# Patient Record
Sex: Male | Born: 1960
Health system: Southern US, Community
[De-identification: ages and names within clinical notes are randomized; demographics above are authoritative.]

## PROBLEM LIST (undated history)

## (undated) DIAGNOSIS — K279 Peptic ulcer, site unspecified, unspecified as acute or chronic, without hemorrhage or perforation: Secondary | ICD-10-CM

## (undated) DIAGNOSIS — K922 Gastrointestinal hemorrhage, unspecified: Secondary | ICD-10-CM

## (undated) DIAGNOSIS — I8500 Esophageal varices without bleeding: Secondary | ICD-10-CM

## (undated) DIAGNOSIS — M1990 Unspecified osteoarthritis, unspecified site: Secondary | ICD-10-CM

## (undated) DIAGNOSIS — K7460 Unspecified cirrhosis of liver: Secondary | ICD-10-CM

## (undated) DIAGNOSIS — F1910 Other psychoactive substance abuse, uncomplicated: Secondary | ICD-10-CM

## (undated) DIAGNOSIS — D649 Anemia, unspecified: Secondary | ICD-10-CM

## (undated) DIAGNOSIS — Z5189 Encounter for other specified aftercare: Secondary | ICD-10-CM

## (undated) DIAGNOSIS — F329 Major depressive disorder, single episode, unspecified: Secondary | ICD-10-CM

## (undated) DIAGNOSIS — C61 Malignant neoplasm of prostate: Secondary | ICD-10-CM

## (undated) DIAGNOSIS — E785 Hyperlipidemia, unspecified: Secondary | ICD-10-CM

## (undated) DIAGNOSIS — IMO0001 Reserved for inherently not codable concepts without codable children: Secondary | ICD-10-CM

## (undated) DIAGNOSIS — R748 Abnormal levels of other serum enzymes: Secondary | ICD-10-CM

## (undated) DIAGNOSIS — F32A Depression, unspecified: Secondary | ICD-10-CM

## (undated) DIAGNOSIS — K219 Gastro-esophageal reflux disease without esophagitis: Secondary | ICD-10-CM

## (undated) DIAGNOSIS — B9681 Helicobacter pylori [H. pylori] as the cause of diseases classified elsewhere: Secondary | ICD-10-CM

## (undated) DIAGNOSIS — Z550 Illiteracy and low-level literacy: Secondary | ICD-10-CM

## (undated) DIAGNOSIS — F1020 Alcohol dependence, uncomplicated: Secondary | ICD-10-CM

## (undated) DIAGNOSIS — I10 Essential (primary) hypertension: Secondary | ICD-10-CM

## (undated) DIAGNOSIS — Z87828 Personal history of other (healed) physical injury and trauma: Secondary | ICD-10-CM

## (undated) HISTORY — PX: APPENDECTOMY: SHX54

## (undated) HISTORY — PX: COLONOSCOPY: SHX174

## (undated) HISTORY — PX: KNEE ARTHROSCOPY: SHX127

## (undated) HISTORY — PX: TONSILLECTOMY: SUR1361

## (undated) HISTORY — DX: Unspecified cirrhosis of liver: K74.60

## (undated) HISTORY — DX: Hyperlipidemia, unspecified: E78.5

## (undated) HISTORY — DX: Unspecified osteoarthritis, unspecified site: M19.90

## (undated) HISTORY — PX: SHOULDER ARTHROSCOPY: SHX128

## (undated) HISTORY — DX: Other psychoactive substance abuse, uncomplicated: F19.10

---
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
## 1999-02-06 ENCOUNTER — Emergency Department (HOSPITAL_COMMUNITY): Admission: EM | Admit: 1999-02-06 | Discharge: 1999-02-06 | Payer: Self-pay | Admitting: Emergency Medicine

## 1999-02-06 ENCOUNTER — Encounter: Payer: Self-pay | Admitting: Emergency Medicine

## 1999-02-06 ENCOUNTER — Encounter: Payer: Self-pay | Admitting: *Deleted

## 1999-02-10 ENCOUNTER — Emergency Department (HOSPITAL_COMMUNITY): Admission: EM | Admit: 1999-02-10 | Discharge: 1999-02-10 | Payer: Self-pay | Admitting: Emergency Medicine

## 2000-02-21 ENCOUNTER — Emergency Department (HOSPITAL_COMMUNITY): Admission: EM | Admit: 2000-02-21 | Discharge: 2000-02-21 | Payer: Self-pay | Admitting: Emergency Medicine

## 2000-06-01 ENCOUNTER — Emergency Department (HOSPITAL_COMMUNITY): Admission: EM | Admit: 2000-06-01 | Discharge: 2000-06-01 | Payer: Self-pay | Admitting: Emergency Medicine

## 2000-10-31 DIAGNOSIS — W3400XA Accidental discharge from unspecified firearms or gun, initial encounter: Secondary | ICD-10-CM

## 2000-10-31 DIAGNOSIS — Z87828 Personal history of other (healed) physical injury and trauma: Secondary | ICD-10-CM

## 2000-10-31 HISTORY — DX: Personal history of other (healed) physical injury and trauma: Z87.828

## 2001-08-12 ENCOUNTER — Encounter: Payer: Self-pay | Admitting: Emergency Medicine

## 2001-08-12 ENCOUNTER — Emergency Department (HOSPITAL_COMMUNITY): Admission: AC | Admit: 2001-08-12 | Discharge: 2001-08-13 | Payer: Self-pay

## 2002-01-02 ENCOUNTER — Emergency Department (HOSPITAL_COMMUNITY): Admission: EM | Admit: 2002-01-02 | Discharge: 2002-01-02 | Payer: Self-pay | Admitting: Emergency Medicine

## 2002-01-06 ENCOUNTER — Emergency Department (HOSPITAL_COMMUNITY): Admission: EM | Admit: 2002-01-06 | Discharge: 2002-01-06 | Payer: Self-pay | Admitting: Emergency Medicine

## 2002-01-08 ENCOUNTER — Emergency Department (HOSPITAL_COMMUNITY): Admission: EM | Admit: 2002-01-08 | Discharge: 2002-01-08 | Payer: Self-pay | Admitting: Emergency Medicine

## 2002-01-10 ENCOUNTER — Emergency Department (HOSPITAL_COMMUNITY): Admission: EM | Admit: 2002-01-10 | Discharge: 2002-01-10 | Payer: Self-pay | Admitting: Emergency Medicine

## 2004-08-23 ENCOUNTER — Ambulatory Visit: Payer: Self-pay | Admitting: Nurse Practitioner

## 2004-08-25 ENCOUNTER — Encounter (HOSPITAL_COMMUNITY): Admission: RE | Admit: 2004-08-25 | Discharge: 2004-11-23 | Payer: Self-pay

## 2004-09-20 ENCOUNTER — Ambulatory Visit: Payer: Self-pay | Admitting: Gastroenterology

## 2004-09-29 ENCOUNTER — Ambulatory Visit: Payer: Self-pay | Admitting: Gastroenterology

## 2004-10-12 ENCOUNTER — Ambulatory Visit: Payer: Self-pay | Admitting: Gastroenterology

## 2004-10-18 ENCOUNTER — Encounter: Admission: RE | Admit: 2004-10-18 | Discharge: 2004-10-18 | Payer: Self-pay | Admitting: Gastroenterology

## 2004-11-03 ENCOUNTER — Ambulatory Visit: Payer: Self-pay | Admitting: Gastroenterology

## 2004-11-04 ENCOUNTER — Ambulatory Visit: Payer: Self-pay | Admitting: Gastroenterology

## 2004-11-11 ENCOUNTER — Ambulatory Visit: Payer: Self-pay | Admitting: Gastroenterology

## 2004-11-11 ENCOUNTER — Ambulatory Visit (HOSPITAL_COMMUNITY): Admission: RE | Admit: 2004-11-11 | Discharge: 2004-11-11 | Payer: Self-pay | Admitting: Gastroenterology

## 2004-11-29 ENCOUNTER — Ambulatory Visit: Payer: Self-pay | Admitting: Gastroenterology

## 2004-12-06 ENCOUNTER — Ambulatory Visit: Payer: Self-pay | Admitting: Nurse Practitioner

## 2004-12-20 ENCOUNTER — Ambulatory Visit: Payer: Self-pay | Admitting: Gastroenterology

## 2004-12-27 ENCOUNTER — Ambulatory Visit: Payer: Self-pay | Admitting: Gastroenterology

## 2005-03-16 ENCOUNTER — Ambulatory Visit: Payer: Self-pay | Admitting: Nurse Practitioner

## 2005-04-28 ENCOUNTER — Ambulatory Visit (HOSPITAL_COMMUNITY): Admission: RE | Admit: 2005-04-28 | Discharge: 2005-04-28 | Payer: Self-pay | Admitting: Internal Medicine

## 2005-05-19 ENCOUNTER — Ambulatory Visit: Payer: Self-pay | Admitting: Nurse Practitioner

## 2005-07-14 ENCOUNTER — Ambulatory Visit (HOSPITAL_BASED_OUTPATIENT_CLINIC_OR_DEPARTMENT_OTHER): Admission: RE | Admit: 2005-07-14 | Discharge: 2005-07-14 | Payer: Self-pay | Admitting: Orthopedic Surgery

## 2005-07-14 ENCOUNTER — Ambulatory Visit (HOSPITAL_COMMUNITY): Admission: RE | Admit: 2005-07-14 | Discharge: 2005-07-14 | Payer: Self-pay | Admitting: Orthopedic Surgery

## 2005-08-08 ENCOUNTER — Ambulatory Visit: Payer: Self-pay | Admitting: Nurse Practitioner

## 2005-09-15 ENCOUNTER — Ambulatory Visit: Payer: Self-pay | Admitting: Nurse Practitioner

## 2005-10-19 ENCOUNTER — Ambulatory Visit: Payer: Self-pay | Admitting: Nurse Practitioner

## 2005-11-25 ENCOUNTER — Ambulatory Visit: Payer: Self-pay | Admitting: Nurse Practitioner

## 2006-05-15 ENCOUNTER — Ambulatory Visit: Payer: Self-pay | Admitting: Nurse Practitioner

## 2007-01-12 ENCOUNTER — Ambulatory Visit: Payer: Self-pay | Admitting: Nurse Practitioner

## 2007-03-14 ENCOUNTER — Encounter: Admission: RE | Admit: 2007-03-14 | Discharge: 2007-03-14 | Payer: Self-pay | Admitting: Family Medicine

## 2007-07-31 ENCOUNTER — Ambulatory Visit (HOSPITAL_BASED_OUTPATIENT_CLINIC_OR_DEPARTMENT_OTHER): Admission: RE | Admit: 2007-07-31 | Discharge: 2007-07-31 | Payer: Self-pay | Admitting: Orthopaedic Surgery

## 2007-08-27 ENCOUNTER — Ambulatory Visit: Payer: Self-pay | Admitting: Nurse Practitioner

## 2007-08-27 LAB — CONVERTED CEMR LAB
Basophils Absolute: 0 10*3/uL (ref 0.0–0.1)
Basophils Relative: 0 % (ref 0–1)
Eosinophils Absolute: 0 10*3/uL (ref 0.0–0.7)
Eosinophils Relative: 1 % (ref 0–5)
HCT: 43.1 % (ref 39.0–52.0)
Hemoglobin: 13.7 g/dL (ref 13.0–17.0)
Lymphocytes Relative: 31 % (ref 12–46)
Lymphs Abs: 1.6 10*3/uL (ref 0.7–3.3)
MCHC: 31.8 g/dL (ref 30.0–36.0)
MCV: 87.8 fL (ref 78.0–100.0)
Monocytes Absolute: 0.6 10*3/uL (ref 0.2–0.7)
Monocytes Relative: 12 % — ABNORMAL HIGH (ref 3–11)
Neutro Abs: 2.9 10*3/uL (ref 1.7–7.7)
Neutrophils Relative %: 56 % (ref 43–77)
Platelets: 323 10*3/uL (ref 150–400)
RBC: 4.91 M/uL (ref 4.22–5.81)
RDW: 14.3 % — ABNORMAL HIGH (ref 11.5–14.0)
WBC: 5.1 10*3/uL (ref 4.0–10.5)

## 2007-11-27 ENCOUNTER — Ambulatory Visit: Payer: Self-pay | Admitting: Family Medicine

## 2007-12-24 ENCOUNTER — Emergency Department (HOSPITAL_COMMUNITY): Admission: EM | Admit: 2007-12-24 | Discharge: 2007-12-24 | Payer: Self-pay | Admitting: Family Medicine

## 2008-06-02 ENCOUNTER — Ambulatory Visit: Payer: Self-pay | Admitting: Internal Medicine

## 2008-06-03 ENCOUNTER — Emergency Department (HOSPITAL_COMMUNITY): Admission: EM | Admit: 2008-06-03 | Discharge: 2008-06-03 | Payer: Self-pay | Admitting: Emergency Medicine

## 2008-12-29 HISTORY — PX: HEMORROIDECTOMY: SUR656

## 2009-01-06 ENCOUNTER — Encounter (INDEPENDENT_AMBULATORY_CARE_PROVIDER_SITE_OTHER): Payer: Self-pay | Admitting: General Surgery

## 2009-01-06 ENCOUNTER — Ambulatory Visit (HOSPITAL_COMMUNITY): Admission: RE | Admit: 2009-01-06 | Discharge: 2009-01-06 | Payer: Self-pay | Admitting: General Surgery

## 2009-06-25 ENCOUNTER — Emergency Department (HOSPITAL_COMMUNITY): Admission: EM | Admit: 2009-06-25 | Discharge: 2009-06-25 | Payer: Self-pay | Admitting: Family Medicine

## 2009-07-16 ENCOUNTER — Ambulatory Visit: Payer: Self-pay | Admitting: Internal Medicine

## 2009-11-17 ENCOUNTER — Ambulatory Visit (HOSPITAL_BASED_OUTPATIENT_CLINIC_OR_DEPARTMENT_OTHER): Admission: RE | Admit: 2009-11-17 | Discharge: 2009-11-17 | Payer: Self-pay | Admitting: Orthopaedic Surgery

## 2010-02-02 ENCOUNTER — Other Ambulatory Visit: Payer: Self-pay

## 2010-02-03 ENCOUNTER — Ambulatory Visit: Payer: Self-pay | Admitting: Psychiatry

## 2010-02-03 ENCOUNTER — Inpatient Hospital Stay (HOSPITAL_COMMUNITY): Admission: AD | Admit: 2010-02-03 | Discharge: 2010-02-08 | Payer: Self-pay | Admitting: Psychiatry

## 2010-08-25 ENCOUNTER — Encounter (INDEPENDENT_AMBULATORY_CARE_PROVIDER_SITE_OTHER): Payer: Self-pay | Admitting: *Deleted

## 2010-08-25 LAB — CONVERTED CEMR LAB
ALT: 21 units/L (ref 0–53)
AST: 24 units/L (ref 0–37)
Albumin: 4.1 g/dL (ref 3.5–5.2)
Alkaline Phosphatase: 73 units/L (ref 39–117)
BUN: 8 mg/dL (ref 6–23)
CO2: 23 meq/L (ref 19–32)
Calcium: 9.1 mg/dL (ref 8.4–10.5)
Chloride: 100 meq/L (ref 96–112)
Cholesterol: 262 mg/dL — ABNORMAL HIGH (ref 0–200)
Creatinine, Ser: 0.86 mg/dL (ref 0.40–1.50)
Glucose, Bld: 97 mg/dL (ref 70–99)
HDL: 101 mg/dL (ref >39)
LDL Cholesterol: 148 mg/dL — ABNORMAL HIGH (ref 0–99)
PSA: 2.36 ng/mL (ref 0.10–4.00)
Potassium: 4.7 meq/L (ref 3.5–5.3)
Sodium: 134 meq/L — ABNORMAL LOW (ref 135–145)
Total Bilirubin: 0.7 mg/dL (ref 0.3–1.2)
Total CHOL/HDL Ratio: 2.6
Total Protein: 7.3 g/dL (ref 6.0–8.3)
Triglycerides: 65 mg/dL (ref <150)
VLDL: 13 mg/dL (ref 0–40)

## 2010-09-03 ENCOUNTER — Ambulatory Visit (HOSPITAL_COMMUNITY): Admission: RE | Admit: 2010-09-03 | Discharge: 2010-09-03 | Payer: Self-pay | Admitting: Psychiatry

## 2010-09-03 ENCOUNTER — Emergency Department (HOSPITAL_COMMUNITY): Admission: EM | Admit: 2010-09-03 | Discharge: 2010-09-06 | Payer: Self-pay | Admitting: Emergency Medicine

## 2010-09-06 DIAGNOSIS — F329 Major depressive disorder, single episode, unspecified: Secondary | ICD-10-CM

## 2010-09-18 ENCOUNTER — Ambulatory Visit: Payer: Self-pay | Admitting: Psychiatry

## 2010-11-15 ENCOUNTER — Encounter
Admission: RE | Admit: 2010-11-15 | Discharge: 2010-11-15 | Payer: Self-pay | Source: Home / Self Care | Attending: Orthopaedic Surgery | Admitting: Orthopaedic Surgery

## 2010-11-21 ENCOUNTER — Encounter: Payer: Self-pay | Admitting: Family Medicine

## 2011-01-11 LAB — RAPID URINE DRUG SCREEN, HOSP PERFORMED
Amphetamines: NOT DETECTED
Barbiturates: NOT DETECTED
Benzodiazepines: NOT DETECTED
Cocaine: POSITIVE — AB
Opiates: NOT DETECTED
Tetrahydrocannabinol: NOT DETECTED

## 2011-01-11 LAB — CBC
HCT: 44.6 % (ref 39.0–52.0)
Hemoglobin: 15.3 g/dL (ref 13.0–17.0)
MCH: 29.7 pg (ref 26.0–34.0)
MCHC: 34.3 g/dL (ref 30.0–36.0)
MCV: 86.8 fL (ref 78.0–100.0)
Platelets: 267 10*3/uL (ref 150–400)
RBC: 5.15 MIL/uL (ref 4.22–5.81)
RDW: 14.6 % (ref 11.5–15.5)
WBC: 4.9 10*3/uL (ref 4.0–10.5)

## 2011-01-11 LAB — BASIC METABOLIC PANEL
BUN: 7 mg/dL (ref 6–23)
CO2: 22 mEq/L (ref 19–32)
Calcium: 9.3 mg/dL (ref 8.4–10.5)
Chloride: 102 mEq/L (ref 96–112)
Creatinine, Ser: 1.03 mg/dL (ref 0.4–1.5)
GFR calc Af Amer: >60 mL/min (ref >60)
GFR calc non Af Amer: >60 mL/min (ref >60)
Glucose, Bld: 109 mg/dL — ABNORMAL HIGH (ref 70–99)
Potassium: 4.3 mEq/L (ref 3.5–5.1)
Sodium: 137 mEq/L (ref 135–145)

## 2011-01-11 LAB — DIFFERENTIAL
Basophils Absolute: 0.1 10*3/uL (ref 0.0–0.1)
Basophils Relative: 1 % (ref 0–1)
Eosinophils Absolute: 0 10*3/uL (ref 0.0–0.7)
Eosinophils Relative: 1 % (ref 0–5)
Lymphocytes Relative: 29 % (ref 12–46)
Lymphs Abs: 1.4 10*3/uL (ref 0.7–4.0)
Monocytes Absolute: 0.5 10*3/uL (ref 0.1–1.0)
Monocytes Relative: 10 % (ref 3–12)
Neutro Abs: 2.8 10*3/uL (ref 1.7–7.7)
Neutrophils Relative %: 59 % (ref 43–77)

## 2011-01-11 LAB — ETHANOL: Alcohol, Ethyl (B): 91 mg/dL — ABNORMAL HIGH (ref 0–10)

## 2011-01-13 ENCOUNTER — Other Ambulatory Visit (HOSPITAL_BASED_OUTPATIENT_CLINIC_OR_DEPARTMENT_OTHER): Payer: Medicaid Other

## 2011-01-13 ENCOUNTER — Encounter (HOSPITAL_BASED_OUTPATIENT_CLINIC_OR_DEPARTMENT_OTHER)
Admission: RE | Admit: 2011-01-13 | Discharge: 2011-01-13 | Disposition: A | Discharge status: Home / Self Care | Payer: Medicare Other | Source: Ambulatory Visit | Attending: Orthopaedic Surgery | Admitting: Orthopaedic Surgery

## 2011-01-16 LAB — POCT HEMOGLOBIN-HEMACUE: Hemoglobin: 14.4 g/dL (ref 13.0–17.0)

## 2011-01-18 ENCOUNTER — Ambulatory Visit (HOSPITAL_BASED_OUTPATIENT_CLINIC_OR_DEPARTMENT_OTHER)
Admission: RE | Admit: 2011-01-18 | Discharge: 2011-01-18 | Disposition: A | Discharge status: Home / Self Care | Payer: Medicare Other | Source: Ambulatory Visit | Attending: Orthopaedic Surgery | Admitting: Orthopaedic Surgery

## 2011-01-18 DIAGNOSIS — Z01812 Encounter for preprocedural laboratory examination: Secondary | ICD-10-CM | POA: Insufficient documentation

## 2011-01-18 DIAGNOSIS — I10 Essential (primary) hypertension: Secondary | ICD-10-CM | POA: Insufficient documentation

## 2011-01-18 DIAGNOSIS — S43429A Sprain of unspecified rotator cuff capsule, initial encounter: Secondary | ICD-10-CM | POA: Insufficient documentation

## 2011-01-18 DIAGNOSIS — W19XXXA Unspecified fall, initial encounter: Secondary | ICD-10-CM | POA: Insufficient documentation

## 2011-01-18 LAB — POCT HEMOGLOBIN-HEMACUE: Hemoglobin: 15.5 g/dL (ref 13.0–17.0)

## 2011-01-19 LAB — CBC
HCT: 38.1 % — ABNORMAL LOW (ref 39.0–52.0)
HCT: 38.2 % — ABNORMAL LOW (ref 39.0–52.0)
Hemoglobin: 12.9 g/dL — ABNORMAL LOW (ref 13.0–17.0)
Hemoglobin: 12.7 g/dL — ABNORMAL LOW (ref 13.0–17.0)
MCHC: 33.7 g/dL (ref 30.0–36.0)
MCHC: 33.2 g/dL (ref 30.0–36.0)
MCV: 88.7 fL (ref 78.0–100.0)
MCV: 89.9 fL (ref 78.0–100.0)
Platelets: 240 10*3/uL (ref 150–400)
Platelets: 208 10*3/uL (ref 150–400)
RBC: 4.3 MIL/uL (ref 4.22–5.81)
RBC: 4.25 MIL/uL (ref 4.22–5.81)
RDW: 15.6 % — ABNORMAL HIGH (ref 11.5–15.5)
RDW: 14.7 % (ref 11.5–15.5)
WBC: 2.7 10*3/uL — ABNORMAL LOW (ref 4.0–10.5)
WBC: 3.7 10*3/uL — ABNORMAL LOW (ref 4.0–10.5)

## 2011-01-19 LAB — HEPATIC FUNCTION PANEL
ALT: 43 U/L (ref 0–53)
AST: 37 U/L (ref 0–37)
Albumin: 3.7 g/dL (ref 3.5–5.2)
Alkaline Phosphatase: 66 U/L (ref 39–117)
Bilirubin, Direct: 0.1 mg/dL (ref 0.0–0.3)
Indirect Bilirubin: 0.4 mg/dL (ref 0.3–0.9)
Total Bilirubin: 0.5 mg/dL (ref 0.3–1.2)
Total Protein: 7 g/dL (ref 6.0–8.3)

## 2011-01-19 LAB — BASIC METABOLIC PANEL
BUN: 5 mg/dL — ABNORMAL LOW (ref 6–23)
CO2: 25 mEq/L (ref 19–32)
Calcium: 9 mg/dL (ref 8.4–10.5)
Chloride: 103 mEq/L (ref 96–112)
Creatinine, Ser: 0.91 mg/dL (ref 0.4–1.5)
GFR calc Af Amer: >60 mL/min (ref >60)
GFR calc non Af Amer: >60 mL/min (ref >60)
Glucose, Bld: 100 mg/dL — ABNORMAL HIGH (ref 70–99)
Potassium: 4 mEq/L (ref 3.5–5.1)
Sodium: 135 mEq/L (ref 135–145)

## 2011-01-19 LAB — RAPID URINE DRUG SCREEN, HOSP PERFORMED
Amphetamines: NOT DETECTED
Barbiturates: NOT DETECTED
Benzodiazepines: NOT DETECTED
Cocaine: POSITIVE — AB
Opiates: NOT DETECTED
Tetrahydrocannabinol: NOT DETECTED

## 2011-01-19 LAB — DIFFERENTIAL
Basophils Absolute: 0 10*3/uL (ref 0.0–0.1)
Basophils Relative: 1 % (ref 0–1)
Eosinophils Absolute: 0 10*3/uL (ref 0.0–0.7)
Eosinophils Relative: 2 % (ref 0–5)
Lymphocytes Relative: 39 % (ref 12–46)
Lymphs Abs: 1.1 10*3/uL (ref 0.7–4.0)
Monocytes Absolute: 0.4 10*3/uL (ref 0.1–1.0)
Monocytes Relative: 16 % — ABNORMAL HIGH (ref 3–12)
Neutro Abs: 1.2 10*3/uL — ABNORMAL LOW (ref 1.7–7.7)
Neutrophils Relative %: 43 % (ref 43–77)

## 2011-01-19 LAB — TRICYCLICS SCREEN, URINE: TCA Scrn: NOT DETECTED

## 2011-01-19 LAB — ETHANOL: Alcohol, Ethyl (B): 84 mg/dL — ABNORMAL HIGH (ref 0–10)

## 2011-02-05 NOTE — Op Note (Signed)
NAME:  Chad Turner, Chad Turner              ACCOUNT NO.:  [id number]  MEDICAL RECORD NO.:  [id number]           PATIENT TYPE:  LOCATION:                                 FACILITY:  PHYSICIAN:  Monico Blitz. Easton Sivertson, M.D.DATE OF BIRTH:  1961/03/21  DATE OF PROCEDURE:  01/18/2011 DATE OF DISCHARGE:                              OPERATIVE REPORT   PREOPERATIVE DIAGNOSIS:  Right shoulder rotator cuff tear.  POSTOPERATIVE DIAGNOSIS:  Right shoulder rotator cuff tear.  PROCEDURE:  Right shoulder debridement.  ANESTHESIA:  General and block.  ATTENDING SURGEON:  Monico Blitz. Rhona Raider, MD   INDICATIONS FOR PROCEDURE:  The patient is a 50 year old man who is more than a year from a rotator cuff repair.  He had a very large tear at that point which were repaired securely.  Unfortunately, he fell a month or 2 afterwards and disrupted the repair.  He struggled with a painful shoulder since that time and he has gotten by with pain medicine and injections.  By repeat MRI scan, he has another very large tear with the tendon retracted with just some early atrophy and fatty infiltration. He is offered another attempt at repair versus debridement.  Informed operative consent was obtained after discussion of possible complications including reaction to anesthesia and infection.  SUMMARY, FINDINGS, AND PROCEDURE:  Under general anesthesia and a block, a right shoulder arthroscopy was performed along with an open procedure. At arthroscopy the glenohumeral joint showed no degenerative changes and the biceps tendon had some mild wear but no real tear.  He had a massive cuff tear which was retracted a couple of centimeters.  I made an anterolateral approach through a deltoid split and tried to advance this, freeing up some of the adhesions and scar tissue but could not get it to the greater tuberosity without undue tension and elected to debride.  DESCRIPTION OF PROCEDURE:  The patient was taken to the operating  suite where general anesthetic was applied without difficulty.  He was also given a block in the preanesthesia area.  He was positioned in beach- chair position and prepped and draped in normal sterile fashion.  After administration of IV vancomycin, an arthroscopy of the right shoulder was formed through a total of 4 portals.  Findings were as noted above and procedure consisted of debridement.  He really does not need any additional bony work.  I thought that we had a tear, we might be able to repair and elected to make a deltoid split.  Dissection was carried through the deltoid through both layers of fascia to expose the joint. I then got my finger inside and tried to free up some adhesions.  We also used some instruments to try to free up the cuff, but I could not get back to the greater tuberosity without undue tension.  My feeling was this would be a repair that would not hold.  We elected to debride and that was done and it had opened followed by arthroscopic fashion. We did leave anterior and posterior sleeves of cuff to fire this structure and stabilize the humeral head.  The shoulder was thoroughly irrigated  followed by reapproximation of portals loosely with nylon and this incision with 0 followed by 2-0 Vicryl in the deltoid and subcutaneous tissues and then nylon in the skin.  Adaptic was applied followed by dry gauze and tape.  Estimated blood loss and fluids can be obtained from anesthesia records.  DISPOSITION:  The patient was extubated in the operating room and taken to recovery room in stable condition.  He is to go home same day and follow up in the office closely.  I will contact him by phone tonight.     Monico Blitz Rhona Raider, M.D.     PGD/MEDQ  D:  01/18/2011  T:  01/19/2011  Job:  [id number]  Electronically Signed by Melrose Nakayama M.D. on 02/05/2011 12:52:17 PM

## 2011-02-10 LAB — COMPREHENSIVE METABOLIC PANEL
ALT: 30 U/L (ref 0–53)
AST: 36 U/L (ref 0–37)
Albumin: 3.7 g/dL (ref 3.5–5.2)
Alkaline Phosphatase: 75 U/L (ref 39–117)
BUN: 3 mg/dL — ABNORMAL LOW (ref 6–23)
CO2: 24 mEq/L (ref 19–32)
Calcium: 9 mg/dL (ref 8.4–10.5)
Chloride: 104 mEq/L (ref 96–112)
Creatinine, Ser: 0.96 mg/dL (ref 0.4–1.5)
GFR calc Af Amer: >60 mL/min (ref >60)
GFR calc non Af Amer: >60 mL/min (ref >60)
Glucose, Bld: 88 mg/dL (ref 70–99)
Potassium: 3.6 mEq/L (ref 3.5–5.1)
Sodium: 134 mEq/L — ABNORMAL LOW (ref 135–145)
Total Bilirubin: 0.8 mg/dL (ref 0.3–1.2)
Total Protein: 7.6 g/dL (ref 6.0–8.3)

## 2011-02-10 LAB — CBC
HCT: 40.2 % (ref 39.0–52.0)
Hemoglobin: 13.2 g/dL (ref 13.0–17.0)
MCHC: 32.9 g/dL (ref 30.0–36.0)
MCV: 85.3 fL (ref 78.0–100.0)
Platelets: 313 10*3/uL (ref 150–400)
RBC: 4.71 MIL/uL (ref 4.22–5.81)
RDW: 14.5 % (ref 11.5–15.5)
WBC: 3.2 10*3/uL — ABNORMAL LOW (ref 4.0–10.5)

## 2011-02-10 LAB — DIFFERENTIAL
Basophils Absolute: 0 10*3/uL (ref 0.0–0.1)
Basophils Relative: 1 % (ref 0–1)
Eosinophils Absolute: 0 10*3/uL (ref 0.0–0.7)
Eosinophils Relative: 1 % (ref 0–5)
Lymphocytes Relative: 44 % (ref 12–46)
Lymphs Abs: 1.4 10*3/uL (ref 0.7–4.0)
Monocytes Absolute: 0.5 10*3/uL (ref 0.1–1.0)
Monocytes Relative: 14 % — ABNORMAL HIGH (ref 3–12)
Neutro Abs: 1.3 10*3/uL — ABNORMAL LOW (ref 1.7–7.7)
Neutrophils Relative %: 40 % — ABNORMAL LOW (ref 43–77)

## 2011-02-23 ENCOUNTER — Ambulatory Visit: Payer: Medicare Other | Attending: Orthopaedic Surgery | Admitting: Rehabilitative and Restorative Service Providers"

## 2011-02-23 DIAGNOSIS — M6281 Muscle weakness (generalized): Secondary | ICD-10-CM | POA: Insufficient documentation

## 2011-02-23 DIAGNOSIS — IMO0001 Reserved for inherently not codable concepts without codable children: Secondary | ICD-10-CM | POA: Insufficient documentation

## 2011-02-23 DIAGNOSIS — M25519 Pain in unspecified shoulder: Secondary | ICD-10-CM | POA: Insufficient documentation

## 2011-03-02 ENCOUNTER — Ambulatory Visit: Payer: Medicare Other | Attending: Orthopaedic Surgery | Admitting: Physical Therapy

## 2011-03-02 DIAGNOSIS — M6281 Muscle weakness (generalized): Secondary | ICD-10-CM | POA: Insufficient documentation

## 2011-03-02 DIAGNOSIS — M25519 Pain in unspecified shoulder: Secondary | ICD-10-CM | POA: Insufficient documentation

## 2011-03-02 DIAGNOSIS — IMO0001 Reserved for inherently not codable concepts without codable children: Secondary | ICD-10-CM | POA: Insufficient documentation

## 2011-03-03 ENCOUNTER — Ambulatory Visit: Payer: Medicare Other | Admitting: Physical Therapy

## 2011-03-09 ENCOUNTER — Ambulatory Visit: Payer: Medicare Other

## 2011-03-10 ENCOUNTER — Ambulatory Visit: Payer: Medicare Other | Admitting: Physical Therapy

## 2011-03-14 ENCOUNTER — Ambulatory Visit: Payer: Medicare Other | Admitting: Physical Therapy

## 2011-03-15 NOTE — Op Note (Signed)
NAME:  [name], [name]              ACCOUNT NO.:  [id number]   MEDICAL RECORD NO.:  [id number]          PATIENT TYPE:  AMB   LOCATION:  DAY                          FACILITY:  [facility]   PHYSICIAN:  Orson Ape. Weatherly, M.D.DATE OF BIRTH:  11-14-1960   DATE OF PROCEDURE:  01/06/2009  DATE OF DISCHARGE:                               OPERATIVE REPORT   PREOPERATIVE DIAGNOSIS:  Internal and external hemorrhoids bleeding,  prolapse.   OPERATION:  Internal and external hemorrhoidectomy x3 quadrants.   General anesthesia.   SURGEON:  Orson Ape. Rise Patience, M.D.   ASSISTANT:  Nurse.   HISTORY:  Chad Turner is a 50 year old black male referred from  [facility] for very symptomatic bleeding, prolapsing hemorrhoids.  The  patient is presently not working on medical disability for bleeding.  I  am not sure what his medical disability is.  He had seen Dr. Rebekah Turner  many years ago and had sclerotherapy on the left side and he has had  recently a colonoscopy by Chad Turner. Benson Norway, MD that did not show any  abnormalities, except for the noted hemorrhoids.  The patient has an  adult living with him and he is here for the planned procedure.  I could  not appreciate any evidence of a hepatomegaly or anything like a  cirrhosis on my examination and his liver function studies are  unremarkable.   The patient was given 400 mg Cipro.  He had taken a bottle of mag-  citrate yesterday.  He was taken back to the operative suite.  I  recommended we used an endotracheal tube and this was administered.  We  then placed him up in the yellow fin stirrups.  He has a very large  hemorrhoids prolapsing out on the right posterior lateral quadrant and  kind of smaller hemorrhoids at the other two quadrants.  I placed him up  in yellow fin stirrups.  We prepped him with Betadine solution and then  I used about 20 mL of Marcaine with adrenaline to anesthetize the  sphincter area.  I then did a fourth finger dilatation  and his largest  hemorrhoid was on the right lateral area that was prolapsing.  There was  a significant one anteriorly and then on the left side there was the  smaller of three.  I elected to elevate the internal hemorrhoid and the  lateral aspect interiorly first and then after I had freed up the area  over the sphincter used I used a Buie clamp to clamp the internal  portion of the hemorrhoid and then put a couple of U stitches of 2-0  chromic.  I used a few 3-0 chromic initially on the external area and  then starting at the apex suturing.  I removed the clamp and then closed  the internal hemorrhoidectomy with a running 2-0 chromic.  In the  external portion I elected to kind of go medially on the anterior side  and then the other side and took a few interrupted sutures so that we  will not have a real tight anus.  There was good hemostasis and then  I  did the right posterior lateral hemorrhoid that was the largest in a  similar manner and elevate it up on the pedicle with a Buie clamp and  then the U stitches of 2-0 chromic and then the external portion was  closed in a similar manner.  I used a few interrupted 2-0 chromic on the  less dominant side.  Next, the last hemorrhoid was the smaller, it was  elevated off the internal sphincter in a similar manner and used to Buie  clamp, some U stitches and the 2-0 chromic running and at the completion  there was not any anal stenosis.  I could get at two good finger nicely.  I looked at all the suture lines with a anoscope and then put Nupercaine  in the anus, held it in place with the gauze 4x4 and then the little  stretch panties.   The patient will be released after a short stay.  Hopefully, will not  have any problems with bleeding and resume his chronic medications.  I  will see him back in the office in approximately 2 weeks.           ______________________________  Orson Ape. Rise Patience, M.D.     WJW/MEDQ  D:  01/06/2009   T:  01/07/2009  Job:  [id number]   cc:   Chad Turner. Benson Norway, MD  Fax: [phone]

## 2011-03-15 NOTE — Op Note (Signed)
NAME:  Baquera, Elva              ACCOUNT NO.:  [id number]   MEDICAL RECORD NO.:  [id number]          PATIENT TYPE:  AMB   LOCATION:  [facility]                          FACILITY:  [facility]   PHYSICIAN:  Monico Blitz. Dalldorf, M.D.DATE OF BIRTH:  03-07-1961   DATE OF PROCEDURE:  07/31/2007  DATE OF DISCHARGE:                               OPERATIVE REPORT   PREOPERATIVE DIAGNOSES:  1. Left knee torn medial meniscus.  2. Left knee degenerative joint disease.   POSTOPERATIVE DIAGNOSES:  1. Left knee torn medial meniscus.  2. Left knee degenerative joint disease.   PROCEDURE:  1. Left knee partial medial meniscectomy.  2. Left knee abrasion chondroplasty, medial and patellofemoral.   ANESTHESIA:  General.   ATTENDING SURGEON:  Monico Blitz. Rhona Raider, M.D.   ASSISTANT:  Roselee Nova, P.A.   INDICATIONS FOR PROCEDURE:  The patient is a 50 year old man with a long  history of knee pain.  He has persisted with difficulty despite oral  anti-inflammatories and injections.  He is several years from a knee  arthroscopy at which point some significant degenerative changes were  found.  He did achieve good relief after that operation.  He has some  significant degenerative changes and is probably going to be best served  with a knee replacement, but that is nothing that he can go through at  this point.  He elects to have a knee arthroscopy in hopes of gaining  him some more time.  Informed operative consent was obtained after  discussion of the possible complications of and reaction to anesthesia  and infection.   SUMMARY, FINDINGS, AND PROCEDURE:  Under general anesthesia, an  arthroscopy of the left knee was performed.  The medial compartment  exhibited some grade 4 change along the femur, addressed with  chondroplasty back to stable structures.  This was a broad area about  the size of a quarter.  The meniscus had a tiny posterior horn tear  addressed with a tiny partial medial meniscectomy.   The ACL appeared  intact.  The lateral compartment was completely benign with no evidence  of meniscal or articular cartilage injury.  The patellofemoral joint  exhibited a strip of cartilage loss in the intertrochlear groove which  was grade 4 with some large flaps of articular cartilage flaking off.  The loose pieces were removed followed by a thorough chondroplasty and  abrasion to bleeding bone.   DESCRIPTION OF PROCEDURE:  The patient was taken to the operating suite  where general anesthetic was applied without difficulty.  He was  positioned supine and prepped and draped in normal sterile fashion.  After the administration of  IV clindamycin, an arthroscopy of the left  knee was performed through a total of two portals.  Findings were as  noted above, and the procedure consisted of the tiny partial medial  meniscectomy followed by chondroplasty of the medial and patellofemoral,  and abrasion to bleeding bone, especially patellofemoral.  The knee was  thoroughly irrigated followed by placement of Marcaine with epinephrine  and morphine plus some Depo-Medrol.  Adaptic was applied followed by dry  gauze  and a loose Ace wrap.  Estimated blood loss and intraoperative  fluids can be obtained from the anesthesia records.   DISPOSITION:  The patient was extubated in the operating room and taken  to the recovery room in stable addition.  He was to go home same-day and  follow up in the office next week.  I will contact him by phone tonight.      Monico Blitz Rhona Raider, M.D.  Electronically Signed     PGD/MEDQ  D:  07/31/2007  T:  07/31/2007  Job:  [id number]

## 2011-03-18 NOTE — Op Note (Signed)
NAME:  Chad Turner, Chad Turner              ACCOUNT NO.:  [id number]   MEDICAL RECORD NO.:  [id number]          PATIENT TYPE:  AMB   LOCATION:  [place]                          FACILITY:  [facility]   PHYSICIAN:  Newt Minion, MD     DATE OF BIRTH:  1961-06-15   DATE OF PROCEDURE:  07/14/2005  DATE OF DISCHARGE:                                 OPERATIVE REPORT   PREOPERATIVE DIAGNOSIS:  Medial meniscal tear, left knee.   POSTOPERATIVE DIAGNOSES:  1.  Medial meniscal tear, left knee.  2.  Lateral meniscal tear, left knee.  3.  Osteochondral defect, medial femoral condyle.   PROCEDURE:  1.  Partial medial and lateral meniscectomies.  2.  Abrasion chondroplasty, medial femoral condyle.   SURGEON.:  Newt Minion, MD   ANESTHESIA:  General.   ESTIMATED BLOOD LOSS:  Minimal.   DRAINS:  None.   COMPLICATIONS:  None.   DISPOSITION:  To PACU in stable condition.   INDICATION FOR PROCEDURE:  The patient is a 50 year old gentleman with  mechanical catching and locking symptoms in his left knee.  The patient has  failed conservative care and presents at this time for arthroscopic  intervention.  Risks and benefits were discussed including infection,  neurovascular injury, persistent pain, need for additional surgery.  The  patient states he understands and wishes to proceed at this time.   DESCRIPTION OF PROCEDURE:  The patient was brought to OR room 6 and  underwent a general anesthetic.  After adequate level of anesthesia was  obtained, the patient's left lower extremity was prepped using DuraPrep and  draped into a sterile field.  The scope was inserted through the  inferolateral portal and an inferomedial working portal was established.  Visualization showed a large osteochondral defect of the medial femoral  condyle.  Using the shaver, this was debrided back to bleeding viable  subchondral bone.  The patient also had a large degenerative tear of the  medial meniscus and this was also  debrided with the shaver.  Examination of  the notch showed an intact ACL and PCL.  Examination of the lateral joint  line in the figure-of-four position showed a tear of the posterior horn of  the lateral meniscus which was debrided with a shaver.  He had good  articular cartilage of the lateral femoral condyle and the lateral aspect of  the lateral meniscus was stable and intact.  Visualization of patellofemoral  joint showed good intact cartilage.  There was a significant amount of  synovitis; this was debrided.  The medial and lateral gutters as well as  suprapatellar pouch were debrided of loose bodies.  A survey of all 3  compartments was again performed; there were no loose bodies.  The  instruments removed.  The portals were closed using 3-0 nylon.  The joint was infused with a total 20 mL of 0.5% Marcaine plain and 4 mg of  morphine.  The wound were covered with Adaptic, orthopedic sponges, sterile  Webril and a Coban dressing.  The patient was extubated and taken to PACU in  stable condition.  Plan for discharge to home.  Follow up in office in 2  weeks.      Newt Minion, MD  Electronically Signed     MVD/MEDQ  D:  07/14/2005  T:  07/15/2005  Job:  [id number]

## 2011-03-22 ENCOUNTER — Ambulatory Visit: Payer: Medicare Other | Admitting: Physical Therapy

## 2011-03-24 ENCOUNTER — Encounter: Payer: Medicare Other | Admitting: Physical Therapy

## 2011-04-01 ENCOUNTER — Encounter: Payer: Medicare Other | Admitting: Physical Therapy

## 2011-07-08 ENCOUNTER — Inpatient Hospital Stay (INDEPENDENT_AMBULATORY_CARE_PROVIDER_SITE_OTHER)
Admission: RE | Admit: 2011-07-08 | Discharge: 2011-07-08 | Disposition: A | Discharge status: Home / Self Care | Payer: Medicare Other | Source: Ambulatory Visit | Attending: Family Medicine | Admitting: Family Medicine

## 2011-07-08 DIAGNOSIS — F1010 Alcohol abuse, uncomplicated: Secondary | ICD-10-CM

## 2011-07-08 DIAGNOSIS — R112 Nausea with vomiting, unspecified: Secondary | ICD-10-CM

## 2011-07-08 LAB — DIFFERENTIAL
Basophils Absolute: 0 10*3/uL (ref 0.0–0.1)
Basophils Relative: 1 % (ref 0–1)
Eosinophils Absolute: 0 10*3/uL (ref 0.0–0.7)
Eosinophils Relative: 1 % (ref 0–5)
Lymphocytes Relative: 36 % (ref 12–46)
Lymphs Abs: 1.6 10*3/uL (ref 0.7–4.0)
Monocytes Absolute: 0.5 10*3/uL (ref 0.1–1.0)
Monocytes Relative: 11 % (ref 3–12)
Neutro Abs: 2.3 10*3/uL (ref 1.7–7.7)
Neutrophils Relative %: 51 % (ref 43–77)

## 2011-07-08 LAB — POCT I-STAT, CHEM 8
BUN: <3 mg/dL — ABNORMAL LOW (ref 6–23)
Calcium, Ion: 1.2 mmol/L (ref 1.12–1.32)
Chloride: 102 mEq/L (ref 96–112)
Creatinine, Ser: 1 mg/dL (ref 0.50–1.35)
Glucose, Bld: 119 mg/dL — ABNORMAL HIGH (ref 70–99)
HCT: 51 % (ref 39.0–52.0)
Hemoglobin: 17.3 g/dL — ABNORMAL HIGH (ref 13.0–17.0)
Potassium: 4.6 mEq/L (ref 3.5–5.1)
Sodium: 135 mEq/L (ref 135–145)
TCO2: 22 mmol/L (ref 0–100)

## 2011-07-08 LAB — LIPASE, BLOOD: Lipase: 34 U/L (ref 11–59)

## 2011-07-08 LAB — CBC
HCT: 44.4 % (ref 39.0–52.0)
Hemoglobin: 15.1 g/dL (ref 13.0–17.0)
MCH: 27.9 pg (ref 26.0–34.0)
MCHC: 34 g/dL (ref 30.0–36.0)
MCV: 81.9 fL (ref 78.0–100.0)
Platelets: 247 10*3/uL (ref 150–400)
RBC: 5.42 MIL/uL (ref 4.22–5.81)
RDW: 13.9 % (ref 11.5–15.5)
WBC: 4.5 10*3/uL (ref 4.0–10.5)

## 2011-07-08 LAB — AMYLASE: Amylase: 58 U/L (ref 0–105)

## 2011-07-29 LAB — CULTURE, ROUTINE-ABSCESS

## 2011-08-11 LAB — POCT HEMOGLOBIN-HEMACUE
Hemoglobin: 13.5
Operator id: 141661

## 2011-08-28 ENCOUNTER — Emergency Department (HOSPITAL_COMMUNITY): Payer: Medicare Other

## 2011-08-28 ENCOUNTER — Observation Stay (HOSPITAL_COMMUNITY)
Admission: EM | Admit: 2011-08-28 | Discharge: 2011-08-29 | Disposition: A | Discharge status: Home / Self Care | Payer: Medicare Other | Attending: Emergency Medicine | Admitting: Emergency Medicine

## 2011-08-28 DIAGNOSIS — I10 Essential (primary) hypertension: Secondary | ICD-10-CM | POA: Insufficient documentation

## 2011-08-28 DIAGNOSIS — F1010 Alcohol abuse, uncomplicated: Secondary | ICD-10-CM | POA: Insufficient documentation

## 2011-08-28 DIAGNOSIS — R112 Nausea with vomiting, unspecified: Secondary | ICD-10-CM | POA: Insufficient documentation

## 2011-08-28 DIAGNOSIS — R079 Chest pain, unspecified: Principal | ICD-10-CM | POA: Insufficient documentation

## 2011-08-28 DIAGNOSIS — R51 Headache: Secondary | ICD-10-CM | POA: Insufficient documentation

## 2011-08-28 LAB — CBC
HCT: 40.1 % (ref 39.0–52.0)
Hemoglobin: 14.2 g/dL (ref 13.0–17.0)
MCH: 29.6 pg (ref 26.0–34.0)
MCHC: 35.4 g/dL (ref 30.0–36.0)
MCV: 83.5 fL (ref 78.0–100.0)
Platelets: 246 10*3/uL (ref 150–400)
RBC: 4.8 MIL/uL (ref 4.22–5.81)
RDW: 13.5 % (ref 11.5–15.5)
WBC: 6.5 10*3/uL (ref 4.0–10.5)

## 2011-08-28 LAB — BASIC METABOLIC PANEL
BUN: 7 mg/dL (ref 6–23)
CO2: 20 mEq/L (ref 19–32)
Calcium: 9.8 mg/dL (ref 8.4–10.5)
Chloride: 98 mEq/L (ref 96–112)
Creatinine, Ser: 0.96 mg/dL (ref 0.50–1.35)
GFR calc Af Amer: >90 mL/min (ref >90)
GFR calc non Af Amer: >90 mL/min (ref >90)
Glucose, Bld: 94 mg/dL (ref 70–99)
Potassium: 3.8 mEq/L (ref 3.5–5.1)
Sodium: 133 mEq/L — ABNORMAL LOW (ref 135–145)

## 2011-08-28 LAB — DIFFERENTIAL
Basophils Absolute: 0 10*3/uL (ref 0.0–0.1)
Basophils Relative: 0 % (ref 0–1)
Eosinophils Absolute: 0 10*3/uL (ref 0.0–0.7)
Eosinophils Relative: 1 % (ref 0–5)
Lymphocytes Relative: 25 % (ref 12–46)
Lymphs Abs: 1.6 10*3/uL (ref 0.7–4.0)
Monocytes Absolute: 0.5 10*3/uL (ref 0.1–1.0)
Monocytes Relative: 8 % (ref 3–12)
Neutro Abs: 4.4 10*3/uL (ref 1.7–7.7)
Neutrophils Relative %: 67 % (ref 43–77)

## 2011-08-28 LAB — POCT I-STAT TROPONIN I
Troponin i, poc: 0 ng/mL (ref 0.00–0.08)
Troponin i, poc: 0 ng/mL (ref 0.00–0.08)
Troponin i, poc: 0.01 ng/mL (ref 0.00–0.08)

## 2011-08-28 LAB — PROTIME-INR
INR: 1 (ref 0.00–1.49)
Prothrombin Time: 13.4 seconds (ref 11.6–15.2)

## 2011-08-29 ENCOUNTER — Observation Stay (HOSPITAL_COMMUNITY): Payer: Medicare Other

## 2011-09-07 ENCOUNTER — Emergency Department (HOSPITAL_COMMUNITY): Payer: Medicare Other

## 2011-09-07 ENCOUNTER — Encounter: Payer: Self-pay | Admitting: Emergency Medicine

## 2011-09-07 ENCOUNTER — Other Ambulatory Visit: Payer: Self-pay

## 2011-09-07 ENCOUNTER — Emergency Department (HOSPITAL_COMMUNITY)
Admission: EM | Admit: 2011-09-07 | Discharge: 2011-09-07 | Disposition: A | Discharge status: Home / Self Care | Payer: Medicare Other | Attending: Emergency Medicine | Admitting: Emergency Medicine

## 2011-09-07 DIAGNOSIS — R079 Chest pain, unspecified: Secondary | ICD-10-CM | POA: Insufficient documentation

## 2011-09-07 DIAGNOSIS — R42 Dizziness and giddiness: Secondary | ICD-10-CM | POA: Insufficient documentation

## 2011-09-07 DIAGNOSIS — I10 Essential (primary) hypertension: Secondary | ICD-10-CM

## 2011-09-07 DIAGNOSIS — F1410 Cocaine abuse, uncomplicated: Secondary | ICD-10-CM | POA: Insufficient documentation

## 2011-09-07 DIAGNOSIS — Z79899 Other long term (current) drug therapy: Secondary | ICD-10-CM | POA: Insufficient documentation

## 2011-09-07 LAB — CBC
HCT: 42 % (ref 39.0–52.0)
Hemoglobin: 13.4 g/dL (ref 13.0–17.0)
MCH: 27.3 pg (ref 26.0–34.0)
MCHC: 31.9 g/dL (ref 30.0–36.0)
MCV: 85.7 fL (ref 78.0–100.0)
Platelets: 273 10*3/uL (ref 150–400)
RBC: 4.9 MIL/uL (ref 4.22–5.81)
RDW: 14.1 % (ref 11.5–15.5)
WBC: 5 10*3/uL (ref 4.0–10.5)

## 2011-09-07 LAB — RAPID URINE DRUG SCREEN, HOSP PERFORMED
Amphetamines: NOT DETECTED
Barbiturates: NOT DETECTED
Benzodiazepines: NOT DETECTED
Cocaine: POSITIVE — AB
Opiates: NOT DETECTED
Tetrahydrocannabinol: NOT DETECTED

## 2011-09-07 LAB — BASIC METABOLIC PANEL
BUN: 5 mg/dL — ABNORMAL LOW (ref 6–23)
CO2: 23 mEq/L (ref 19–32)
Calcium: 9.8 mg/dL (ref 8.4–10.5)
Chloride: 102 mEq/L (ref 96–112)
Creatinine, Ser: 0.9 mg/dL (ref 0.50–1.35)
GFR calc Af Amer: >90 mL/min (ref >90)
GFR calc non Af Amer: >90 mL/min (ref >90)
Glucose, Bld: 88 mg/dL (ref 70–99)
Potassium: 4.1 mEq/L (ref 3.5–5.1)
Sodium: 139 mEq/L (ref 135–145)

## 2011-09-07 LAB — CARDIAC PANEL(CRET KIN+CKTOT+MB+TROPI)
CK, MB: 3.6 ng/mL (ref 0.3–4.0)
Relative Index: 1.3 (ref 0.0–2.5)
Total CK: 282 U/L — ABNORMAL HIGH (ref 7–232)
Troponin I: <0.3 ng/mL (ref <0.30)

## 2011-09-07 NOTE — ED Provider Notes (Signed)
History     CSN: [id number] Arrival date & time: 09/07/2011  4:11 PM   First MD Initiated Contact with Patient 09/07/11 2054      No chief complaint on file.   (Consider location/radiation/quality/duration/timing/severity/associated sxs/prior treatment) HPI Comments: Return to the chief complaint of chest pain that started this morning. Of note he came in October 29 with similar chest pain. Then he had a complete cardiac workup and a followup cardiac stress test that was all negative for coronary artery disease. Patient states that the pain did not radiate he denies shortness of breath, swelling, claudication, orthopnea, PND, and DOE. Patient does drink alcohol and uses cocaine. Currently the patient's chest pain is resolved however he states that it has been coming and going all day long. Patient denies diaphoresis, palpitations, cough, fever, congestion or hematuria.  Patient is a 50 y.o. male presenting with chest pain. The history is provided by the patient.  Chest Pain Pertinent negatives for primary symptoms include no fever, no fatigue, no shortness of breath, no cough, no wheezing, no palpitations, no abdominal pain, no nausea, no vomiting and no dizziness.  Pertinent negatives for associated symptoms include no diaphoresis.     History reviewed. No pertinent past medical history.  Past Surgical History  Procedure Date  . Knee surgery Jan 2012    No family history on file.  History  Substance Use Topics  . Smoking status: Never Smoker   . Smokeless tobacco: Not on file  . Alcohol Use: Yes     fDrinks approx 8 40oz beers /week      Review of Systems  Constitutional: Negative for fever, chills, diaphoresis, activity change, fatigue and unexpected weight change.  HENT: Negative for congestion, neck pain and neck stiffness.   Eyes: Negative for visual disturbance.  Respiratory: Negative for apnea, cough, chest tightness, shortness of breath, wheezing and stridor.     Cardiovascular: Positive for chest pain. Negative for palpitations and leg swelling.  Gastrointestinal: Negative for nausea, vomiting, abdominal pain, diarrhea and blood in stool.  Genitourinary: Negative for dysuria, urgency, hematuria and flank pain.  Musculoskeletal: Negative for myalgias, back pain and gait problem.  Skin: Negative for pallor.  Neurological: Negative for dizziness, syncope, light-headedness and headaches.  All other systems reviewed and are negative.    Allergies  Penicillins  Home Medications   Current Outpatient Rx  Name Route Sig Dispense Refill  . ATENOLOL 50 MG PO TABS Oral Take 50 mg by mouth daily.      . OXYCODONE-ACETAMINOPHEN 5-325 MG PO TABS Oral Take 1 tablet by mouth every 4 (four) hours as needed. pain       BP 144/101  Pulse 87  Temp(Src) 98.6 F (37 C) (Oral)  Resp 16  Ht 5\' 11"  (1.803 m)  Wt 222 lb (100.699 kg)  BMI 30.96 kg/m2  SpO2 99%  Physical Exam  Nursing note and vitals reviewed. Constitutional: He is oriented to person, place, and time. He appears well-developed and well-nourished. No distress.  HENT:  Head: Normocephalic and atraumatic.  Eyes:       Normal appearance  Neck: Normal range of motion.  Cardiovascular: Normal rate, regular rhythm, normal heart sounds and intact distal pulses.  Exam reveals no gallop and no friction rub.   No murmur heard. Pulmonary/Chest: Effort normal and breath sounds normal. No respiratory distress. He exhibits no tenderness.  Abdominal: Soft. Bowel sounds are normal. He exhibits no distension. There is no tenderness. There is no guarding.  Musculoskeletal:  He exhibits no edema and no tenderness.       2+ Dorsalis Pedis pulses  Neurological: He is alert and oriented to person, place, and time.  Skin: Skin is warm and dry. No rash noted. He is not diaphoretic.  Psychiatric: He has a normal mood and affect. His behavior is normal.    ED Course  Procedures (including critical care  time)  Labs Reviewed  BASIC METABOLIC PANEL - Abnormal; Notable for the following:    BUN 5 (*)    All other components within normal limits  CARDIAC PANEL(CRET KIN+CKTOT+MB+TROPI) - Abnormal; Notable for the following:    Total CK 282 (*)    All other components within normal limits  URINE RAPID DRUG SCREEN (HOSP PERFORMED) - Abnormal; Notable for the following:    Cocaine POSITIVE (*)    All other components within normal limits  CBC   Dg Chest 1 View  09/07/2011  *RADIOLOGY REPORT*  Clinical Data: Chest pain, dizziness  CHEST - 1 VIEW  Comparison: 08/28/11  Findings: Cardiomediastinal silhouette is stable.  No acute infiltrate or pleural effusion.  No pulmonary edema.  Bony thorax is stable.  IMPRESSION: No active disease.  No significant change.  Original Report Authenticated By: Natasha Mead, M.D.     No diagnosis found.    MDM  Cocaine abuse Chest Pain        Jaci Carrel, [place] 09/07/11 2117

## 2011-09-07 NOTE — ED Provider Notes (Deleted)
    [place], [place] 09/07/11 2106

## 2011-09-07 NOTE — ED Notes (Signed)
Pt c/o non radiating left chest pain onset last pain, denies nausea, vomiting or shortness of breath

## 2011-09-07 NOTE — ED Provider Notes (Signed)
History     CSN: [id number] Arrival date & time: 09/07/2011  4:11 PM   None     No chief complaint on file.   (Consider location/radiation/quality/duration/timing/severity/associated sxs/prior treatment) HPI  History reviewed. No pertinent past medical history.  Past Surgical History  Procedure Date  . Knee surgery Jan 2012    No family history on file.  History  Substance Use Topics  . Smoking status: Never Smoker   . Smokeless tobacco: Not on file  . Alcohol Use: Yes     fDrinks approx 8 40oz beers /week      Review of Systems  Allergies  Penicillins  Home Medications   Current Outpatient Rx  Name Route Sig Dispense Refill  . ATENOLOL 50 MG PO TABS Oral Take 50 mg by mouth daily.      . OXYCODONE-ACETAMINOPHEN 5-325 MG PO TABS Oral Take 1 tablet by mouth every 4 (four) hours as needed. pain       BP 144/101  Pulse 87  Temp(Src) 98.6 F (37 C) (Oral)  Resp 16  Ht 5\' 11"  (1.803 m)  Wt 222 lb (100.699 kg)  BMI 30.96 kg/m2  SpO2 99%  Physical Exam  ED Course  Procedures (including critical care time)  Labs Reviewed  BASIC METABOLIC PANEL - Abnormal; Notable for the following:    BUN 5 (*)    All other components within normal limits  CARDIAC PANEL(CRET KIN+CKTOT+MB+TROPI) - Abnormal; Notable for the following:    Total CK 282 (*)    All other components within normal limits  URINE RAPID DRUG SCREEN (HOSP PERFORMED) - Abnormal; Notable for the following:    Cocaine POSITIVE (*)    All other components within normal limits  CBC   Dg Chest 1 View  09/07/2011  *RADIOLOGY REPORT*  Clinical Data: Chest pain, dizziness  CHEST - 1 VIEW  Comparison: 08/28/11  Findings: Cardiomediastinal silhouette is stable.  No acute infiltrate or pleural effusion.  No pulmonary edema.  Bony thorax is stable.  IMPRESSION: No active disease.  No significant change.  Original Report Authenticated By: Natasha Mead, M.D.     No diagnosis found.    MDM  Duplicate  note        Doug Sou, MD 09/08/11 [phone]

## 2011-09-07 NOTE — ED Notes (Signed)
EKG done in triage.  Given to Dr. Ethelda Chick and signed at 1620.  Old EKG given as well

## 2011-09-08 NOTE — ED Provider Notes (Signed)
Medical screening examination/treatment/procedure(s) were performed by non-physician practitioner and as supervising physician I was immediately available for consultation/collaboration.  Doug Sou, MD 09/08/11 [phone]

## 2011-09-28 ENCOUNTER — Encounter: Payer: Self-pay | Admitting: Gastroenterology

## 2011-10-12 ENCOUNTER — Encounter (HOSPITAL_COMMUNITY): Payer: Self-pay | Admitting: Emergency Medicine

## 2011-10-12 ENCOUNTER — Emergency Department (INDEPENDENT_AMBULATORY_CARE_PROVIDER_SITE_OTHER)
Admission: EM | Admit: 2011-10-12 | Discharge: 2011-10-12 | Disposition: A | Discharge status: Home / Self Care | Payer: Medicare Other | Source: Home / Self Care | Attending: Emergency Medicine | Admitting: Emergency Medicine

## 2011-10-12 DIAGNOSIS — R6889 Other general symptoms and signs: Secondary | ICD-10-CM

## 2011-10-12 HISTORY — DX: Essential (primary) hypertension: I10

## 2011-10-12 MED ORDER — BENZONATATE 100 MG PO CAPS: 100.0000 mg | ORAL_CAPSULE | Freq: Three times a day (TID) | ORAL | Status: AC

## 2011-10-12 MED ORDER — FLUTICASONE PROPIONATE 50 MCG/ACT NA SUSP: 2.0000 | Freq: Every day | NASAL | Status: DC

## 2011-10-12 MED ORDER — ALBUTEROL SULFATE HFA 108 (90 BASE) MCG/ACT IN AERS: 1.0000 | INHALATION_SPRAY | Freq: Four times a day (QID) | RESPIRATORY_TRACT | Status: DC | PRN

## 2011-10-12 MED ORDER — IBUPROFEN 600 MG PO TABS: 600.0000 mg | ORAL_TABLET | Freq: Four times a day (QID) | ORAL | Status: AC | PRN

## 2011-10-12 MED ORDER — GUAIFENESIN ER 600 MG PO TB12: 600.0000 mg | ORAL_TABLET | Freq: Two times a day (BID) | ORAL | Status: DC

## 2011-10-12 MED ORDER — ONDANSETRON 8 MG PO TBDP: ORAL_TABLET | ORAL | Status: AC

## 2011-10-12 NOTE — ED Notes (Signed)
Pt here with flu like sx that started 2 dys ago with coughing,yellow mucous and sore throat and feeling warm t the touch.pt also reports of body aches and chills.no diarrhea but vomiting x 2 dys with sx.

## 2011-10-12 NOTE — ED Provider Notes (Signed)
History     CSN: [id number] Arrival date & time: 10/12/2011 11:24 AM   First MD Initiated Contact with Patient 10/12/11 1025      Chief Complaint  Patient presents with  . Influenza    (Consider location/radiation/quality/duration/timing/severity/associated sxs/prior treatment) HPI Comments: HPI : Flu symptoms for about 2 day. States feels "hot" but no documented fevers. reports chills, sweats, myalgias, fatigue, headache, sore throat. Nonproductive cough, wheezing, unable to sleep at night secondary to coughing.  Symptoms are progressively worsening, despite trying OTC fever reducing medicine and rest and fluids. Has decreased appetite and vomiting, but tolerating some liquids by mouth. No diarrhea. No flushot this year.  Review of Systems: Positive for fatigue, mild nasal congestion, mild sore throat, mild swollen anterior neck glands, mild cough. Negative for acute vision changes, stiff neck, focal weakness, syncope, seizures, respiratory distress,  diarrhea, GU symptoms.    Past Medical History  Diagnosis Date  . Hypertension     Past Surgical History  Procedure Date  . Knee surgery Jan 2012    No family history on file.  History  Substance Use Topics  . Smoking status: Never Smoker   . Smokeless tobacco: Not on file  . Alcohol Use: Yes     fDrinks approx 8 40oz beers /week      Review of Systems  Allergies  Penicillins  Home Medications   Current Outpatient Rx  Name Route Sig Dispense Refill  . ALBUTEROL SULFATE HFA 108 (90 BASE) MCG/ACT IN AERS Inhalation Inhale 1-2 puffs into the lungs every 6 (six) hours as needed for wheezing. 1 Inhaler 0  . ATENOLOL 50 MG PO TABS Oral Take 50 mg by mouth daily.      Marland Kitchen BENZONATATE 100 MG PO CAPS Oral Take 1 capsule (100 mg total) by mouth every 8 (eight) hours. 21 capsule 0  . FLUTICASONE PROPIONATE 50 MCG/ACT NA SUSP Nasal Place 2 sprays into the nose daily. 16 g 0  . GUAIFENESIN ER 600 MG PO TB12 Oral Take 1 tablet  (600 mg total) by mouth 2 (two) times daily. 14 tablet 0  . IBUPROFEN 600 MG PO TABS Oral Take 1 tablet (600 mg total) by mouth every 6 (six) hours as needed for pain. 30 tablet 0    BP 157/77  Temp(Src) 99.9 F (37.7 C) (Oral)  Resp 18  SpO2 98%  Physical Exam  Nursing note and vitals reviewed. Constitutional: He is oriented to person, place, and time. He appears well-developed and well-nourished.  HENT:  Head: Normocephalic and atraumatic.  Right Ear: Hearing, tympanic membrane and ear canal normal.  Left Ear: Hearing, tympanic membrane and ear canal normal.  Nose: Mucosal edema and rhinorrhea present. No epistaxis.  Mouth/Throat: Uvula is midline and mucous membranes are normal. Posterior oropharyngeal erythema present. No oropharyngeal exudate.       No sinus tenderness  Eyes: Conjunctivae and EOM are normal. Pupils are equal, round, and reactive to light.  Neck: Normal range of motion. Neck supple.       Shotty LN bilaterally  Cardiovascular: Normal rate, regular rhythm and normal heart sounds.   Pulmonary/Chest: Effort normal and breath sounds normal. No respiratory distress.  Abdominal: Soft. Bowel sounds are normal. He exhibits no distension and no mass. There is no tenderness. There is no rebound and no guarding.  Musculoskeletal: Normal range of motion. He exhibits no edema and no tenderness.  Neurological: He is alert and oriented to person, place, and time.  Skin: Skin is warm  and dry. No rash noted.  Psychiatric: He has a normal mood and affect. His behavior is normal. Judgment and thought content normal.    ED Course  Procedures (including critical care time)  Labs Reviewed - No data to display No results found.   1. Flu-like symptoms       MDM  Pt appears to be in NAD. VSS. Pt non-toxic appearing. Appears well hydrated. Tolerating po per pt. No evidence of pharyngitis or OM. No evidence of neck stiffness or other sx to support meningitis. No evidence of  dehydration. Abd S/NT/ND without peritoneal sx. Doubt intraabdominal process. No evidence of PNA or UTI. Pt able to tolerate PO. Pt with viral syndrome, probable flu. Will treat symptomatically and have pt f/u with PCP PRN   Luiz Blare, MD 10/12/11 1259

## 2011-12-28 ENCOUNTER — Other Ambulatory Visit: Payer: Self-pay | Admitting: Orthopaedic Surgery

## 2011-12-29 ENCOUNTER — Encounter (HOSPITAL_BASED_OUTPATIENT_CLINIC_OR_DEPARTMENT_OTHER): Payer: Self-pay | Admitting: *Deleted

## 2011-12-29 NOTE — Progress Notes (Signed)
Pt cannot read-mom to come with him-poor hx given-denies sob/cp To come in for bmet
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
## 2012-01-02 ENCOUNTER — Encounter (HOSPITAL_BASED_OUTPATIENT_CLINIC_OR_DEPARTMENT_OTHER)
Admission: RE | Admit: 2012-01-02 | Discharge: 2012-01-02 | Disposition: A | Discharge status: Home / Self Care | Payer: Medicare Other | Source: Ambulatory Visit | Attending: Orthopaedic Surgery | Admitting: Orthopaedic Surgery

## 2012-01-02 LAB — BASIC METABOLIC PANEL
BUN: 4 mg/dL — ABNORMAL LOW (ref 6–23)
CO2: 21 mEq/L (ref 19–32)
Calcium: 9.7 mg/dL (ref 8.4–10.5)
Chloride: 98 mEq/L (ref 96–112)
Creatinine, Ser: 0.77 mg/dL (ref 0.50–1.35)
GFR calc Af Amer: >90 mL/min (ref >90)
GFR calc non Af Amer: >90 mL/min (ref >90)
Glucose, Bld: 102 mg/dL — ABNORMAL HIGH (ref 70–99)
Potassium: 3.9 mEq/L (ref 3.5–5.1)
Sodium: 132 mEq/L — ABNORMAL LOW (ref 135–145)

## 2012-01-02 MED ORDER — CHLORHEXIDINE GLUCONATE 4 % EX LIQD: 60.0000 mL | Freq: Once | CUTANEOUS | Status: DC

## 2012-01-02 MED ORDER — LACTATED RINGERS IV SOLN
INTRAVENOUS | Status: DC
  Administered 2012-01-03 (×2): via INTRAVENOUS

## 2012-01-03 ENCOUNTER — Encounter (HOSPITAL_BASED_OUTPATIENT_CLINIC_OR_DEPARTMENT_OTHER): Payer: Self-pay | Admitting: *Deleted

## 2012-01-03 ENCOUNTER — Ambulatory Visit (HOSPITAL_BASED_OUTPATIENT_CLINIC_OR_DEPARTMENT_OTHER)
Admission: RE | Admit: 2012-01-03 | Discharge: 2012-01-03 | Disposition: A | Discharge status: Home / Self Care | Payer: Medicare Other | Source: Ambulatory Visit | Attending: Orthopaedic Surgery | Admitting: Orthopaedic Surgery

## 2012-01-03 ENCOUNTER — Ambulatory Visit (HOSPITAL_BASED_OUTPATIENT_CLINIC_OR_DEPARTMENT_OTHER): Payer: Medicare Other | Admitting: *Deleted

## 2012-01-03 ENCOUNTER — Encounter (HOSPITAL_BASED_OUTPATIENT_CLINIC_OR_DEPARTMENT_OTHER)
Admission: RE | Disposition: A | Discharge status: Home / Self Care | Payer: Self-pay | Source: Ambulatory Visit | Attending: Orthopaedic Surgery

## 2012-01-03 DIAGNOSIS — Z1811 Retained magnetic metal fragments: Secondary | ICD-10-CM | POA: Insufficient documentation

## 2012-01-03 DIAGNOSIS — M795 Residual foreign body in soft tissue: Secondary | ICD-10-CM | POA: Insufficient documentation

## 2012-01-03 DIAGNOSIS — I10 Essential (primary) hypertension: Secondary | ICD-10-CM | POA: Insufficient documentation

## 2012-01-03 DIAGNOSIS — Z79899 Other long term (current) drug therapy: Secondary | ICD-10-CM | POA: Insufficient documentation

## 2012-01-03 HISTORY — PX: FOREIGN BODY REMOVAL: SHX962

## 2012-01-03 HISTORY — DX: Illiteracy and low-level literacy: Z55.0

## 2012-01-03 LAB — POCT HEMOGLOBIN-HEMACUE: Hemoglobin: 13.8 g/dL (ref 13.0–17.0)

## 2012-01-03 SURGERY — FOREIGN BODY REMOVAL ADULT
Anesthesia: General | Site: Knee | Laterality: Right | Wound class: Clean

## 2012-01-03 MED ORDER — MIDAZOLAM HCL 5 MG/5ML IJ SOLN
INTRAMUSCULAR | Status: DC | PRN
Start: 2012-01-03 — End: 2012-01-03
  Administered 2012-01-03: 2 mg via INTRAVENOUS

## 2012-01-03 MED ORDER — SUCCINYLCHOLINE CHLORIDE 20 MG/ML IJ SOLN
INTRAMUSCULAR | Status: DC | PRN
  Administered 2012-01-03: 120 mg via INTRAVENOUS

## 2012-01-03 MED ORDER — FENTANYL CITRATE 0.05 MG/ML IJ SOLN
INTRAMUSCULAR | Status: DC | PRN
  Administered 2012-01-03: 100 ug via INTRAVENOUS

## 2012-01-03 MED ORDER — EPHEDRINE SULFATE 50 MG/ML IJ SOLN
INTRAMUSCULAR | Status: DC | PRN
  Administered 2012-01-03: 10 mg via INTRAVENOUS
  Administered 2012-01-03 (×2): 5 mg via INTRAVENOUS

## 2012-01-03 MED ORDER — HYDROMORPHONE HCL PF 1 MG/ML IJ SOLN: 0.2500 mg | INTRAMUSCULAR | Status: DC | PRN

## 2012-01-03 MED ORDER — ONDANSETRON HCL 4 MG/2ML IJ SOLN
INTRAMUSCULAR | Status: DC | PRN
  Administered 2012-01-03: 4 mg via INTRAVENOUS

## 2012-01-03 MED ORDER — LIDOCAINE HCL (CARDIAC) 20 MG/ML IV SOLN
INTRAVENOUS | Status: DC | PRN
  Administered 2012-01-03: 60 mg via INTRAVENOUS

## 2012-01-03 MED ORDER — PROMETHAZINE HCL 25 MG/ML IJ SOLN: 6.2500 mg | INTRAMUSCULAR | Status: DC | PRN

## 2012-01-03 MED ORDER — OXYCODONE-ACETAMINOPHEN 5-325 MG PO TABS: 1.0000 | ORAL_TABLET | ORAL | Status: DC | PRN

## 2012-01-03 MED ORDER — PROPOFOL 10 MG/ML IV EMUL
INTRAVENOUS | Status: DC | PRN
  Administered 2012-01-03: 150 mg via INTRAVENOUS
  Administered 2012-01-03: 80 mg via INTRAVENOUS

## 2012-01-03 MED ORDER — MEPERIDINE HCL 25 MG/ML IJ SOLN: 6.2500 mg | INTRAMUSCULAR | Status: DC | PRN

## 2012-01-03 MED ORDER — DEXAMETHASONE SODIUM PHOSPHATE 4 MG/ML IJ SOLN
INTRAMUSCULAR | Status: DC | PRN
  Administered 2012-01-03: 10 mg via INTRAVENOUS

## 2012-01-03 MED ORDER — BUPIVACAINE HCL (PF) 0.5 % IJ SOLN
INTRAMUSCULAR | Status: DC | PRN
  Administered 2012-01-03: 10 mL

## 2012-01-03 SURGICAL SUPPLY — 46 items
BANDAGE ELASTIC 3 VELCRO ST LF (GAUZE/BANDAGES/DRESSINGS) IMPLANT
BANDAGE ELASTIC 4 VELCRO ST LF (GAUZE/BANDAGES/DRESSINGS) IMPLANT
BANDAGE ELASTIC 6 VELCRO ST LF (GAUZE/BANDAGES/DRESSINGS) ×2 IMPLANT
BANDAGE GAUZE ELAST BULKY 4 IN (GAUZE/BANDAGES/DRESSINGS) ×2 IMPLANT
BLADE MINI RND TIP GREEN BEAV (BLADE) IMPLANT
BLADE SURG 15 STRL LF DISP TIS (BLADE) ×1 IMPLANT
BLADE SURG 15 STRL SS (BLADE) ×1
BNDG ESMARK 4X9 LF (GAUZE/BANDAGES/DRESSINGS) IMPLANT
COVER MAYO STAND STRL (DRAPES) ×2 IMPLANT
COVER TABLE BACK 60X90 (DRAPES) IMPLANT
CUFF TOURNIQUET SINGLE 24IN (TOURNIQUET CUFF) IMPLANT
DRAPE EXTREMITY T 121X128X90 (DRAPE) ×2 IMPLANT
DRSG EMULSION OIL 3X3 NADH (GAUZE/BANDAGES/DRESSINGS) ×2 IMPLANT
DRSG PAD ABDOMINAL 8X10 ST (GAUZE/BANDAGES/DRESSINGS) ×2 IMPLANT
DURAPREP 26ML APPLICATOR (WOUND CARE) ×2 IMPLANT
GAUZE SPONGE 4X4 12PLY STRL LF (GAUZE/BANDAGES/DRESSINGS) ×4 IMPLANT
GAUZE SPONGE 4X4 16PLY XRAY LF (GAUZE/BANDAGES/DRESSINGS) IMPLANT
GLOVE BIO SURGEON STRL SZ 6.5 (GLOVE) ×2 IMPLANT
GLOVE BIO SURGEON STRL SZ8.5 (GLOVE) IMPLANT
GLOVE BIOGEL PI IND STRL 7.0 (GLOVE) ×1 IMPLANT
GLOVE BIOGEL PI IND STRL 8 (GLOVE) ×1 IMPLANT
GLOVE BIOGEL PI IND STRL 8.5 (GLOVE) IMPLANT
GLOVE BIOGEL PI INDICATOR 7.0 (GLOVE) ×1
GLOVE BIOGEL PI INDICATOR 8 (GLOVE) ×1
GLOVE BIOGEL PI INDICATOR 8.5 (GLOVE)
GLOVE SS BIOGEL STRL SZ 8 (GLOVE) ×1 IMPLANT
GLOVE SUPERSENSE BIOGEL SZ 8 (GLOVE) ×1
GOWN PREVENTION PLUS XLARGE (GOWN DISPOSABLE) ×2 IMPLANT
GOWN PREVENTION PLUS XXLARGE (GOWN DISPOSABLE) ×2 IMPLANT
LOOP VESSEL MAXI BLUE (MISCELLANEOUS) IMPLANT
NEEDLE HYPO 25X1 1.5 SAFETY (NEEDLE) ×2 IMPLANT
NS IRRIG 1000ML POUR BTL (IV SOLUTION) ×2 IMPLANT
PACK ARTHROSCOPY DSU (CUSTOM PROCEDURE TRAY) ×2 IMPLANT
PACK BASIN DAY SURGERY FS (CUSTOM PROCEDURE TRAY) ×2 IMPLANT
PADDING CAST ABS 4INX4YD NS (CAST SUPPLIES) ×1
PADDING CAST ABS COTTON 4X4 ST (CAST SUPPLIES) ×1 IMPLANT
PENCIL BUTTON HOLSTER BLD 10FT (ELECTRODE) ×2 IMPLANT
STOCKINETTE 4X48 STRL (DRAPES) ×2 IMPLANT
SUT ETHILON 4 0 PS 2 18 (SUTURE) ×2 IMPLANT
SUT VIC AB 4-0 P-3 18XBRD (SUTURE) IMPLANT
SUT VIC AB 4-0 P3 18 (SUTURE)
SYR BULB 3OZ (MISCELLANEOUS) IMPLANT
SYR CONTROL 10ML LL (SYRINGE) ×2 IMPLANT
TOWEL OR 17X24 6PK STRL BLUE (TOWEL DISPOSABLE) ×2 IMPLANT
UNDERPAD 30X30 INCONTINENT (UNDERPADS AND DIAPERS) ×2 IMPLANT
WATER STERILE IRR 1000ML POUR (IV SOLUTION) IMPLANT

## 2012-01-03 NOTE — Transfer of Care (Signed)
Immediate Anesthesia Transfer of Care Note  Patient: Chad Turner.  Procedure(s) Performed: Procedure(s) (LRB): FOREIGN BODY REMOVAL ADULT (Right)  Patient Location: PACU  Anesthesia Type: General  Level of Consciousness: awake, alert  and oriented  Airway & Oxygen Therapy: Patient Spontanous Breathing and Patient connected to face mask oxygen  Post-op Assessment: Report given to PACU RN, Post -op Vital signs reviewed and stable and Patient moving all extremities  Post vital signs: Reviewed and stable  Complications: No apparent anesthesia complications
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
## 2012-01-03 NOTE — Anesthesia Preprocedure Evaluation (Addendum)
Anesthesia Evaluation  Patient identified by MRN, date of birth, ID band Patient awake    Reviewed: Allergy & Precautions, H&P , NPO status , Patient's Chart, lab work & pertinent test results  History of Anesthesia Complications Negative for: history of anesthetic complications  Airway Mallampati: I  Neck ROM: Full    Dental  (+) Teeth Intact   Pulmonary neg pulmonary ROS,  breath sounds clear to auscultation        Cardiovascular hypertension, Pt. on medications Rhythm:Regular Rate:Normal     Neuro/Psych negative neurological ROS  negative psych ROS   GI/Hepatic   Endo/Other    Renal/GU      Musculoskeletal   Abdominal (+) + obese,   Peds  Hematology   Anesthesia Other Findings   Reproductive/Obstetrics                           Anesthesia Physical Anesthesia Plan  ASA: II  Anesthesia Plan: General   Post-op Pain Management:    Induction: Intravenous  Airway Management Planned: LMA  Additional Equipment:   Intra-op Plan:   Post-operative Plan: Extubation in OR  Informed Consent: I have reviewed the patients History and Physical, chart, labs and discussed the procedure including the risks, benefits and alternatives for the proposed anesthesia with the patient or authorized representative who has indicated his/her understanding and acceptance.   Dental advisory given  Plan Discussed with: CRNA and Surgeon  Anesthesia Plan Comments:         Anesthesia Quick Evaluation

## 2012-01-03 NOTE — Anesthesia Postprocedure Evaluation (Signed)
  Anesthesia Post-op Note  Patient: Chad Turner.  Procedure(s) Performed: Procedure(s) (LRB): FOREIGN BODY REMOVAL ADULT (Right)  Patient Location: PACU  Anesthesia Type: General  Level of Consciousness: awake  Airway and Oxygen Therapy: Patient Spontanous Breathing and Patient connected to face mask oxygen  Post-op Pain: mild  Post-op Assessment: Post-op Vital signs reviewed, Patient's Cardiovascular Status Stable, Respiratory Function Stable and Patent Airway  Post-op Vital Signs: Reviewed and stable  Complications: No apparent anesthesia complications

## 2012-01-03 NOTE — Discharge Instructions (Signed)
[facility]  [address] [place], [place] [address] [phone]   [place], [place] [address] [phone]  Discharge Instructions After Orthopedic Procedures:  *You may feel tired and weak following your procedure. It is recommended that you limit physical activity for the next 24 hours and rest at home for the remainder of today and tomorrow. *No strenuous activity should be started without your doctor's permission.  Elevate the extremity that you had surgery on to a level above your heart. This should continue for 48 hours or as instructed by your doctor.  If you had hand, arm or shoulder surgery you should move your fingers frequently unless otherwise instructed by your doctor.  If you had foot, knee or leg surgery you should wiggle your toes frequently unless otherwise instructed by your doctor.  Follow your doctor's exact instructions for activity at home. Use your home equipment as instructed. (Crutches, hard shoes, slings etc.)  Limit your activity as instructed by your doctor.  Report to your doctor should any of the following occur: 1. Extreme swelling of your fingers or toes. 2. Inability to wiggle your fingers or toes. 3. Coldness, pale or bluish color in your fingers or toes. 4. Loss of sensation, numbness or tingling of your fingers or toes. 5. Unusual smell or odor from under your dressing or cast. 6. Excessive bleeding or drainage from the surgical site. 7. Pain not relieved by medication your doctor has prescribed for you. 8. Cast or dressing too tight (do not get your dressing or cast wet or put anything under          your dressing or cast.)  *Do not change your dressing unless instructed by your doctor or discharge nurse. Then follow exact instructions.  *Follow labeled instructions for any medications that your doctor may have prescribed for you. *Should any questions or complications develop  following your procedure, PLEASE CONTACT YOUR DOCTOR.    Post Anesthesia Home Care Instructions  Activity: Get plenty of rest for the remainder of the day. A responsible adult should stay with you for 24 hours following the procedure.  For the next 24 hours, DO NOT: -Drive a car -Paediatric nurse -Drink alcoholic beverages -Take any medication unless instructed by your physician -Make any legal decisions or sign important papers.  Meals: Start with liquid foods such as gelatin or soup. Progress to regular foods as tolerated. Avoid greasy, spicy, heavy foods. If nausea and/or vomiting occur, drink only clear liquids until the nausea and/or vomiting subsides. Call your physician if vomiting continues.  Special Instructions/Symptoms: Your throat may feel dry or sore from the anesthesia or the breathing tube placed in your throat during surgery. If this causes discomfort, gargle with warm salt water. The discomfort should disappear within 24 hours.
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
## 2012-01-03 NOTE — Anesthesia Procedure Notes (Signed)
Procedure Name: Intubation Date/Time: 01/03/2012 3:08 PM Performed by: [place]ney [place]ers Pre-anesthesia Checklist: Patient identified, Emergency Drugs available, Suction available, Patient being monitored and Timeout performed Patient Re-evaluated:Patient Re-evaluated prior to inductionOxygen Delivery Method: Circle System Utilized Preoxygenation: Pre-oxygenation with 100% oxygen Intubation Type: IV induction Ventilation: Mask ventilation without difficulty Laryngoscope Size: Miller and 3 Grade View: Grade I Tube type: Oral Tube size: 8.0 mm Number of attempts: 1 Airway Equipment and Method: stylet Placement Confirmation: ETT inserted through vocal cords under direct vision,  positive ETCO2 and breath sounds checked- equal and bilateral Secured at: 24 cm Tube secured with: Tape Dental Injury: Teeth and Oropharynx as per pre-operative assessment

## 2012-01-04 ENCOUNTER — Encounter (HOSPITAL_BASED_OUTPATIENT_CLINIC_OR_DEPARTMENT_OTHER): Payer: Self-pay | Admitting: Orthopaedic Surgery
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
## 2012-01-09 ENCOUNTER — Encounter (HOSPITAL_COMMUNITY): Payer: Self-pay | Admitting: *Deleted

## 2012-01-09 ENCOUNTER — Emergency Department (HOSPITAL_COMMUNITY)
Admission: EM | Admit: 2012-01-09 | Discharge: 2012-01-10 | Disposition: A | Discharge status: Other Acute Inpatient Hospital | Payer: Medicare Other | Source: Home / Self Care | Attending: Emergency Medicine | Admitting: Emergency Medicine

## 2012-01-09 DIAGNOSIS — F1020 Alcohol dependence, uncomplicated: Secondary | ICD-10-CM | POA: Insufficient documentation

## 2012-01-09 DIAGNOSIS — F1410 Cocaine abuse, uncomplicated: Secondary | ICD-10-CM | POA: Insufficient documentation

## 2012-01-09 DIAGNOSIS — I10 Essential (primary) hypertension: Secondary | ICD-10-CM | POA: Insufficient documentation

## 2012-01-09 LAB — RAPID URINE DRUG SCREEN, HOSP PERFORMED
Amphetamines: NOT DETECTED
Barbiturates: NOT DETECTED
Benzodiazepines: NOT DETECTED
Cocaine: POSITIVE — AB
Opiates: NOT DETECTED
Tetrahydrocannabinol: NOT DETECTED

## 2012-01-09 LAB — CBC
HCT: 41.8 % (ref 39.0–52.0)
Hemoglobin: 14.1 g/dL (ref 13.0–17.0)
MCH: 28.8 pg (ref 26.0–34.0)
MCHC: 33.7 g/dL (ref 30.0–36.0)
MCV: 85.3 fL (ref 78.0–100.0)
Platelets: 272 10*3/uL (ref 150–400)
RBC: 4.9 MIL/uL (ref 4.22–5.81)
RDW: 15.3 % (ref 11.5–15.5)
WBC: 4.9 10*3/uL (ref 4.0–10.5)

## 2012-01-09 LAB — POCT I-STAT, CHEM 8
BUN: 4 mg/dL — ABNORMAL LOW (ref 6–23)
Calcium, Ion: 1.12 mmol/L (ref 1.12–1.32)
Chloride: 105 mEq/L (ref 96–112)
Creatinine, Ser: 1 mg/dL (ref 0.50–1.35)
Glucose, Bld: 100 mg/dL — ABNORMAL HIGH (ref 70–99)
HCT: 48 % (ref 39.0–52.0)
Hemoglobin: 16.3 g/dL (ref 13.0–17.0)
Potassium: 4.2 mEq/L (ref 3.5–5.1)
Sodium: 135 mEq/L (ref 135–145)
TCO2: 18 mmol/L (ref 0–100)

## 2012-01-09 LAB — ETHANOL: Alcohol, Ethyl (B): 26 mg/dL — ABNORMAL HIGH (ref 0–11)

## 2012-01-09 MED ORDER — ATENOLOL 50 MG PO TABS
50.0000 mg | ORAL_TABLET | Freq: Every day | ORAL | Status: DC
  Administered 2012-01-09 – 2012-01-10 (×2): 50 mg via ORAL
  Filled 2012-01-09 (×5): qty 1

## 2012-01-09 MED ORDER — HYDROCHLOROTHIAZIDE 25 MG PO TABS
25.0000 mg | ORAL_TABLET | Freq: Once | ORAL | Status: AC
  Administered 2012-01-09: 25 mg via ORAL
  Filled 2012-01-09: qty 1

## 2012-01-09 MED ORDER — LORAZEPAM 1 MG PO TABS
1.0000 mg | ORAL_TABLET | Freq: Three times a day (TID) | ORAL | Status: DC | PRN
  Administered 2012-01-09: 1 mg via ORAL
  Filled 2012-01-09: qty 1

## 2012-01-09 MED ORDER — ZOLPIDEM TARTRATE 5 MG PO TABS
5.0000 mg | ORAL_TABLET | Freq: Every evening | ORAL | Status: DC | PRN
  Administered 2012-01-09: 5 mg via ORAL
  Filled 2012-01-09: qty 1

## 2012-01-09 MED ORDER — ACETAMINOPHEN 325 MG PO TABS
650.0000 mg | ORAL_TABLET | ORAL | Status: DC | PRN
  Administered 2012-01-10: 650 mg via ORAL
  Filled 2012-01-09: qty 2

## 2012-01-09 NOTE — ED Notes (Signed)
C/o anxiety/tremors

## 2012-01-09 NOTE — ED Provider Notes (Signed)
Chad Turner. is a 51 y.o. male he was referred to the behavioral [facility] for admission. Her concern because his last blood pressure was 157/95. He has been treated with Ativan and albuterol in the ED. At this time. The patient is calm and comfortable. He has no headache. He states his nervousness is better since starting the Ativan here. There is no evidence for hypertensive crisis. The patient missed his atenolol dose yesterday. He has been using cocaine.   On trending of, blood pressure. Over last several visits, it is clear that the patient's blood sugar tends to run somewhat elevated. Current pressure does not require active treatment in the emergency department. He can be followed expectantly over the next several weeks if he remains asymptomatic. I will add hydrochlorothiazide 25 mg daily to his treatment plan. The atenolol, likely is not the best medicine for him and can likely be weaned if his pressure stabilizes to normal.  22:30-The patient is medically cleared and stable for psychiatric treatment in a psychiatric facility.      Richarda Blade, MD 01/09/12 2230

## 2012-01-09 NOTE — ED Notes (Signed)
Assessment team calls to request recheck of VS at 2100.

## 2012-01-09 NOTE — ED Notes (Signed)
Manual BP requested by MD for verification.
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
## 2012-01-09 NOTE — ED Notes (Addendum)
Pt reports wanting help with detox from cocaine/alcohol. Last drink and cocaine use last night. Drinks approx 4-5 quarts of beer/daily. Has been using cocaine daily for past month. Reports mild tremors. Denies SI/HI.

## 2012-01-10 ENCOUNTER — Encounter (HOSPITAL_COMMUNITY): Payer: Self-pay

## 2012-01-10 ENCOUNTER — Inpatient Hospital Stay (HOSPITAL_COMMUNITY)
Admission: AD | Admit: 2012-01-10 | Discharge: 2012-01-17 | DRG: 897 | Disposition: A | Discharge status: Home / Self Care | Payer: Medicare Other | Source: Ambulatory Visit | Attending: Psychiatry | Admitting: Psychiatry

## 2012-01-10 DIAGNOSIS — F1420 Cocaine dependence, uncomplicated: Secondary | ICD-10-CM

## 2012-01-10 DIAGNOSIS — Z880 Allergy status to penicillin: Secondary | ICD-10-CM

## 2012-01-10 DIAGNOSIS — F1410 Cocaine abuse, uncomplicated: Secondary | ICD-10-CM | POA: Diagnosis present

## 2012-01-10 DIAGNOSIS — I10 Essential (primary) hypertension: Secondary | ICD-10-CM

## 2012-01-10 DIAGNOSIS — F1020 Alcohol dependence, uncomplicated: Principal | ICD-10-CM | POA: Diagnosis present

## 2012-01-10 MED ORDER — ALBUTEROL SULFATE HFA 108 (90 BASE) MCG/ACT IN AERS: 1.0000 | INHALATION_SPRAY | Freq: Four times a day (QID) | RESPIRATORY_TRACT | Status: DC | PRN

## 2012-01-10 MED ORDER — HYDROXYZINE HCL 25 MG PO TABS: 25.0000 mg | ORAL_TABLET | Freq: Four times a day (QID) | ORAL | Status: AC | PRN

## 2012-01-10 MED ORDER — CHLORDIAZEPOXIDE HCL 25 MG PO CAPS: 25.0000 mg | ORAL_CAPSULE | Freq: Four times a day (QID) | ORAL | Status: AC | PRN

## 2012-01-10 MED ORDER — CHLORDIAZEPOXIDE HCL 25 MG PO CAPS
50.0000 mg | ORAL_CAPSULE | Freq: Once | ORAL | Status: AC
  Administered 2012-01-10: 50 mg via ORAL
  Filled 2012-01-10: qty 2

## 2012-01-10 MED ORDER — ALUM & MAG HYDROXIDE-SIMETH 200-200-20 MG/5ML PO SUSP: 30.0000 mL | ORAL | Status: DC | PRN

## 2012-01-10 MED ORDER — ACETAMINOPHEN 325 MG PO TABS
650.0000 mg | ORAL_TABLET | Freq: Four times a day (QID) | ORAL | Status: DC | PRN
Start: 2012-01-10 — End: 2012-01-17
  Administered 2012-01-10 – 2012-01-14 (×3): 650 mg via ORAL

## 2012-01-10 MED ORDER — CHLORDIAZEPOXIDE HCL 25 MG PO CAPS
25.0000 mg | ORAL_CAPSULE | Freq: Three times a day (TID) | ORAL | Status: AC
  Administered 2012-01-12 (×3): 25 mg via ORAL
  Filled 2012-01-10 (×3): qty 1

## 2012-01-10 MED ORDER — ADULT MULTIVITAMIN W/MINERALS CH
1.0000 | ORAL_TABLET | Freq: Every day | ORAL | Status: DC
  Administered 2012-01-10 – 2012-01-17 (×8): 1 via ORAL
  Filled 2012-01-10 (×9): qty 1

## 2012-01-10 MED ORDER — CHLORDIAZEPOXIDE HCL 25 MG PO CAPS
25.0000 mg | ORAL_CAPSULE | Freq: Four times a day (QID) | ORAL | Status: AC
  Administered 2012-01-10 – 2012-01-11 (×5): 25 mg via ORAL
  Filled 2012-01-10 (×5): qty 1

## 2012-01-10 MED ORDER — CHLORDIAZEPOXIDE HCL 25 MG PO CAPS
25.0000 mg | ORAL_CAPSULE | Freq: Every day | ORAL | Status: AC
  Administered 2012-01-14: 25 mg via ORAL
  Filled 2012-01-10: qty 1

## 2012-01-10 MED ORDER — THIAMINE HCL 100 MG/ML IJ SOLN
100.0000 mg | Freq: Once | INTRAMUSCULAR | Status: AC
  Administered 2012-01-10: 17:00:00 via INTRAMUSCULAR

## 2012-01-10 MED ORDER — ATENOLOL 50 MG PO TABS
50.0000 mg | ORAL_TABLET | Freq: Every day | ORAL | Status: DC
  Administered 2012-01-11 – 2012-01-17 (×7): 50 mg via ORAL
  Filled 2012-01-10: qty 14
  Filled 2012-01-10 (×9): qty 1

## 2012-01-10 MED ORDER — VITAMIN B-1 100 MG PO TABS
100.0000 mg | ORAL_TABLET | Freq: Every day | ORAL | Status: DC
  Administered 2012-01-11 – 2012-01-17 (×7): 100 mg via ORAL
  Filled 2012-01-10 (×8): qty 1

## 2012-01-10 MED ORDER — LOPERAMIDE HCL 2 MG PO CAPS
2.0000 mg | ORAL_CAPSULE | ORAL | Status: AC | PRN
  Administered 2012-01-11: 4 mg via ORAL
  Administered 2012-01-12 (×2): 2 mg via ORAL

## 2012-01-10 MED ORDER — TRAZODONE HCL 100 MG PO TABS
100.0000 mg | ORAL_TABLET | Freq: Every evening | ORAL | Status: DC | PRN
  Administered 2012-01-10: 100 mg via ORAL
  Filled 2012-01-10: qty 1

## 2012-01-10 MED ORDER — ONDANSETRON 4 MG PO TBDP: 4.0000 mg | ORAL_TABLET | Freq: Four times a day (QID) | ORAL | Status: AC | PRN

## 2012-01-10 MED ORDER — MAGNESIUM HYDROXIDE 400 MG/5ML PO SUSP: 30.0000 mL | Freq: Every day | ORAL | Status: DC | PRN

## 2012-01-10 MED ORDER — CHLORDIAZEPOXIDE HCL 25 MG PO CAPS
25.0000 mg | ORAL_CAPSULE | ORAL | Status: AC
  Administered 2012-01-13 (×2): 25 mg via ORAL
  Filled 2012-01-10 (×2): qty 1
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
## 2012-01-10 NOTE — ED Notes (Signed)
C/o pain in incision of leg, 6/10

## 2012-01-10 NOTE — Discharge Planning (Signed)
Patient has been accepted to [facility] by [facility] bed 307-1. Patient's support paperwork has been completed. EDP notified and is in agreement with disposition. EDP will discharge pt to [facility]. Pt nurse notified as well. ALL appropriate paperwork completed and forwarded to [facility] for review.   Owens Shark Gabriellia Rempel ANN S , MSW, LCSWA 01/10/2012 12:04 PM [phone]

## 2012-01-10 NOTE — Progress Notes (Signed)
Psych ED Group  Facilitated group focused on grief and loss. Group discussed types of losses (re: death, job loss, loss of home/stability, loss of security, loss of sense of self) and identified grief reactions to loss. Group also discussed ways to cope/overcome struggles in life. Group was active and engaged w/ counselor and w/ one another, all members shared openly and supported one another.  Pt was active and engaged in group. Pt shared struggle w/ ETOH and cocaine use, stated that he was seeking help b/c he recognized he was relapsing after 9 mo sobriety. Pt related to other group members that shared coping w/ loss of home/safety/security and how he had to work to get self out of it.  Huie Ghuman B MS, LPCA, [place]
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
## 2012-01-11 DIAGNOSIS — F1420 Cocaine dependence, uncomplicated: Secondary | ICD-10-CM

## 2012-01-11 DIAGNOSIS — F1020 Alcohol dependence, uncomplicated: Principal | ICD-10-CM

## 2012-01-11 MED ORDER — GABAPENTIN 100 MG PO CAPS
100.0000 mg | ORAL_CAPSULE | Freq: Three times a day (TID) | ORAL | Status: DC
  Administered 2012-01-11 – 2012-01-17 (×17): 100 mg via ORAL
  Filled 2012-01-11 (×6): qty 1
  Filled 2012-01-11: qty 42
  Filled 2012-01-11 (×4): qty 1
  Filled 2012-01-11: qty 42
  Filled 2012-01-11 (×11): qty 1
  Filled 2012-01-11: qty 42

## 2012-01-11 MED ORDER — TRAZODONE HCL 50 MG PO TABS
150.0000 mg | ORAL_TABLET | Freq: Every evening | ORAL | Status: DC | PRN
  Administered 2012-01-11 – 2012-01-16 (×6): 150 mg via ORAL
  Filled 2012-01-11 (×5): qty 3
  Filled 2012-01-11: qty 42
  Filled 2012-01-11: qty 3
  Filled 2012-01-11: qty 1

## 2012-01-11 NOTE — Progress Notes (Signed)
[place] Group Notes:  (Counselor/Nursing/MHT/Case Management/Adjunct)  01/11/2012 3:08 PM  Type of Therapy:  Group Therapy  Participation Level:  Active  Participation Quality:  Attentive and Sharing  Affect:  Appropriate  Cognitive:  Alert and Oriented  Insight:  Good  Engagement in Group:  Good  Engagement in Therapy:  Good  Modes of Intervention:  Clarification, Socialization and Support  Summary of Progress/Problems:  Chad Turner was the first to share feelings that often prompt him to anger reside in shame and guilt towards self.  I know I need to pay my bill but will spend the money on something else and will lay there and feel guilty until someone asks me about something and I over react often in anger.  Pt was first to be able to say 'I deserve recovery' and shared how well things were going for 8 months while in recovery.    Chad Turner 01/11/2012, 3:08 PM
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
## 2012-01-11 NOTE — Progress Notes (Signed)
Pt appropriate on unit interacting with staff and peers. He rates depression as 5 and hopelessness as 0. He reports no detox symptoms and denies si and hi. Offered support and 15 minute checks. Safety maintained on unit.

## 2012-01-11 NOTE — Progress Notes (Signed)
Pt attended discharge planning group and actively participated.  Pt presents with flat affect and depressed mood.  Pt was open with sharing reason for entering the hospital.  Pt states that he has been drinking daily, drinking alcohol all day.  Pt states that he has been drinking like this for the past 3 months with occasional crack use.  Pt states that he has a long history of alcohol use and wanted to detox and get treatment before he goes down the wrong path again.  Pt states that he has been doing well lately, having his own nice apartment and has transportation.  Pt states that he is on probation and goes to [facility] monthly for probation.  Pt states that he was set up to go to [facility] tomorrow to be assessed and set up to go to [facility] for further treatment.  SW states that SW will contact [facility] for pt, cancel that appointment and get pt set up to go to [facility] from here.  Pt states that his mom can assist with paying his bill for him while he is in treatment and will have transportation to [facility].  Pt denies depression, anxiety and SI.  No further needs voiced by pt at this time.  Elenore Paddy 01/11/2012  10:39 AM    Per State Regulation 482.30 This Chart was reviewed for medical necessity with respect to the patient's Admission/Duration of stay.   [place] Payment  01/11/2012  Next Review Date:  01/13/12

## 2012-01-11 NOTE — Progress Notes (Signed)
[place] Group Notes:  (Counselor/Nursing/MHT/Case Management/Adjunct)  01/11/2012 1:50 PM  Type of Therapy:  Group Therapy  Participation Level:  Active  Participation Quality:  Appropriate, Attentive and Sharing  Affect:  Appropriate  Cognitive:  Alert and Appropriate  Insight:  Good  Engagement in Group:  Good  Engagement in Therapy:  Good  Modes of Intervention:  Clarification, Education, Support and Exploration  Summary of Progress/Problems: Patient reported he only experience negative emotions when he begins to drink alcohol. He stated he understands a way to help him regulate those emotions that would often cause him to relapse into unhealthy behaviors is by reflecting. He stated he understands if he reflects on the things in life that are valuable to him it would prevent him from relapsing. Patient gave example, his grandchildren and his home ("all the things he lost due to relapsing into unhealthy behaviors").   Frederich Cha 01/11/2012, 1:50 PM
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
## 2012-01-11 NOTE — Progress Notes (Signed)
Contact made with patient's mother, Ms [place] at [phone], for collateral information. Mother had no additional concerns as to patient's reasons for admit.. Ms Dalgleish was surprised and grateful when patient requested a ride to detox. Mother was provided with Mobile Crisis Management contact number should they feel things do not go well after detox.  Lyla Glassing 01/11/2012 4:33 PM

## 2012-01-11 NOTE — Progress Notes (Signed)
Patient ID: Chad Mohs., male   DOB: [birth year], 51 y.o.   MRN: [id number] Pt. In bed, resp. Even, unlabored, no distress noted. Pt. Will be monitored for safety q30mn. Pt. Is safe on the unit.

## 2012-01-11 NOTE — Progress Notes (Signed)
Cosigned by Sheilah Pigeon, LCSWA 3/13/20133:07 PM
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
## 2012-01-12 NOTE — Progress Notes (Signed)
Patient ID: Chad Mohs., male   DOB: [birth year], 51 y.o.   MRN: [id number] Pt. Reports "better day", reports depression as 7 out of 10. Pt. Talks about brother's death and his decision not to attend the funeral. Pt. Reports he wants to remember his brother alive, also reports don't want to be around the drinking, "they'll be trying to numb the pain". Pt. Denies SHI. Staff will monitor for safety q17mn. Pt. Remains safe on the unit.

## 2012-01-12 NOTE — Progress Notes (Signed)
Patient ID: Chad Turner., male   DOB: 09-22-1961, 51 y.o.   MRN: [id number] Pt. Reports "not shaking now", "appetite better, couldn't eat when I first got here, just a little and it would come back." Pt. Reports groups helpful, "I'm getting something out of them." Pt. Reports ready to give up the alcohol, "I don't want to lose what I got, got me an apartment, got it furnished off." Staff will monitor q17mn for safety. Pt. Remains safe on the unit.

## 2012-01-12 NOTE — Tx Team (Signed)
Interdisciplinary Treatment Plan Update (Adult)  Date:  01/12/2012  Time Reviewed:  9:36 AM   Progress in Treatment: Attending groups: Yes Participating in groups:  Yes Taking medication as prescribed: Yes Tolerating medication:  Yes Family/Significant othe contact made: Counselor assessing for appropriate contact  Patient understands diagnosis:  Yes Discussing patient identified problems/goals with staff:  Yes Medical problems stabilized or resolved:  Yes Denies suicidal/homicidal ideation: Yes Issues/concerns per patient self-inventory:  None identified Other: N/A  New problem(s) identified: None Identified  Reason for Continuation of Hospitalization: Anxiety Depression Medication stabilization Suicidal ideation Withdrawal symptoms  Interventions implemented related to continuation of hospitalization: mood stabilization, medication monitoring and adjustment, group therapy and psycho education, safety checks q 15 mins  Additional comments: N/A  Estimated length of stay: 3-5 days  Discharge Plan: Pt wants long term substance abuse treatment, SW assessing for appropriate referrals  New goal(s): N/A  Review of initial/current patient goals per problem list:    1.  Goal(s): Address substance use  Met:  No  Target date: by discharge  As evidenced by: completing detox protocol and refer to appropriate treatment  2.  Goal (s): Reduce depressive and anxiety symptoms  Met:  No  Target date: by discharge  As evidenced by: Reducing depression from a 10 to a 3 as reported by pt.    3.  Goal(s): Eliminate SI  Met:  No  Target date: by discharge  As evidenced by: pt denying SI   Attendees: Patient:  Chad Turner 01/12/2012 10:13 AM   Family:     Physician:  Cheryll Cockayne, DO 01/12/2012 9:36 AM   Nursing: Salvatore Marvel, RN 01/12/2012 9:36 AM   Case Manager:  Regan Lemming, [place] 01/12/2012  9:36 AM   Counselor:  Frutoso Chase, LCSWA 01/12/2012  9:36 AM     Other:  Ripley Fraise, LCSW 01/12/2012 9:36 AM   Other:  Nena Polio, PA 01/12/2012 9:41 AM   Other:  Frederich Cha, SW intern 01/12/2012 9:41 AM   Other:  Jerre Simon, SW intern 01/12/2012 9:41 AM    Scribe for Treatment Team:   Regan Lemming 01/12/2012 9:36 AM

## 2012-01-12 NOTE — Progress Notes (Signed)
Pt attended discharge planning group and actively participated.  Pt presents with flat affect and depressed mood.  Pt ranks depression at a 6-7 today and denies having anxiety or SI.  Pt states that he found out his brother died of a heart attack.  Pt states that his family is going to [place] and he wishes he could go with them but knows he can't.  Pt states that he is depressed and sad by this news.  Pt continues to want further SA treatment at [facility].  Pt is able to go for treatment on 3/19.  Pt reports having transportation to get there.  Pt contacted his probation officer yesterday and left a voicemail message regarding his whereabouts.  Pt also requesting phone number for [facility] to let them know where he is as well.  No further needs voiced at this time.  Safety planning and suicide prevention discussed.     Regan Lemming, LCSWA 01/12/2012  9:12 AM

## 2012-01-12 NOTE — Progress Notes (Signed)
Cosigned by Sheilah Pigeon, LCSWA 3/14/20134:11 PM

## 2012-01-12 NOTE — Progress Notes (Signed)
01/12/2012         Time: 1415      Group Topic/Focus: The focus of this group is on discussing various styles of communication and communicating assertively using 'I' (feeling) statements.  Participation Level: Minimal  Participation Quality: Resistant  Affect: Depressed  Cognitive: Distracted  Additional Comments: Patient visibly depressed, not engaging in group. Patient preoccupied, reports finding out that his brother died today, support offered.   Eugen Jeansonne 01/12/2012 3:58 PM

## 2012-01-12 NOTE — Progress Notes (Signed)
Patient up and visible in the milieu.  Attending groups.  Interacting well with peers and staff.  Affect brighter.  Denies suicidal ideation.  Patient plans to go to [facility] on Tuesday.  Came to treatment team today and presented himself well.  Patients brother died earlier this week.  [place] arrangements are still being made.  Patient unsure if he wants to go to the funeral.

## 2012-01-12 NOTE — Progress Notes (Signed)
[place] Group Notes:  (Counselor/Nursing/MHT/Case Management/Adjunct)  01/12/2012 3:07 PM  Type of Therapy:  Group Therapy  Participation Level:  None  Participation Quality:  Drowsy  Affect:  Asleep  Cognitive:  Unable to Assess  Insight:  None  Engagement in Group:  None  Engagement in Therapy:  None  Modes of Intervention:  Clarification, Education, Support and Exploration  Summary of Progress/Problems: Patient slept during group.   Chad Turner 01/12/2012, 3:07 PM

## 2012-01-12 NOTE — Progress Notes (Signed)
[place] Group Notes:  (Counselor/Nursing/MHT/Case Management/Adjunct)  01/12/2012 3:59 PM  Type of Therapy:  1:15PM Group Therapy  Participation Level:  Active  Participation Quality:  Appropriate, Attentive and Sharing  Affect:  Appropriate  Cognitive:  Alert and Appropriate  Insight:  Good  Engagement in Group:  Good  Engagement in Therapy:  Good  Modes of Intervention:  Clarification, Education, Support and Exploration  Summary of Progress/Problems: In discussing life and balance, patient stated when his life was last balanced he was able to financially provide for his mother and children. Patient chose a photo of a family to represent what his life would resemble if it were balanced. He stated his life is imbalanced when he has to return home to live with his mother. Patient appeared engaged in the thoughts and opinions of his peers the during group discussion.   Frederich Cha 01/12/2012, 3:59 PM

## 2012-01-13 NOTE — Progress Notes (Signed)
01/13/2012 Nursing  09[address] is seen  First thing this morning at med pass, he makes good eye contact. HE is pleasant and cooperative. He takes his med as ordered by the MD. He completed his self inventory and on it he wrote he denied SI, he rated his feelings of depression and hopelessness " 5 / 6 " and stated his DC plan includes to " go to meetings". A Therapeutic relationship  Is established . R Safety is maintaiend and POC cont with emphasis on 12 step propram PD RN [facility]
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
## 2012-01-13 NOTE — Progress Notes (Signed)
[place] Group Notes:  (Counselor/Nursing/MHT/Case Management/Adjunct)  01/13/2012 3:12 PM  Type of Therapy:  1:15PM Group Therapy  Participation Level:  None  Participation Quality:  Attentive and Resistant  Affect:  Depressed  Cognitive:  Oriented  Insight:  None  Engagement in Group:  None  Engagement in Therapy:  None  Modes of Intervention:  Clarification, Education, Support and Exploration  Summary of Progress/Problems: Patient appeared to be really sad during group. Patient reported he would rather not verbally participate in group, due to the recent lost of his brother. However, patient stated he would remain in group and just listen to the discussion given by his peers.   Frederich Cha 01/13/2012, 3:12 PM

## 2012-01-13 NOTE — Progress Notes (Signed)
Pt attended discharge planning group and actively participated.  Pt presents with flat affect and depressed mood.  Pt was tearful when talking about his brother's death.  Pt states that he is sad about his brother's death and spoke to his mom yesterday about the funeral arrangements.  Pt states that he wants to go to the funeral but knows there will be drinking and doesn't think that's a good idea to be around at this time.  Pt states that he decided to stay here until Tuesday, when he can go straight to [facility] for further treatment.  Pt ranks depression at an 8 and denies anxiety and SI.  No further needs voiced by pt at this time.   Regan Lemming, [employer] 01/13/2012  9:34 AM    Per State Regulation 482.30 This Chart was reviewed for medical necessity with respect to the patient's Admission/Duration of stay.   [place] Payment  01/13/2012  Next Review Date: 01/15/12

## 2012-01-14 NOTE — Progress Notes (Signed)
Patient ID: Georgiann Mohs., male   DOB: Jan 14, 1961, 51 y.o.   MRN: [id number]  [facility] Group Notes:  (Counselor/Nursing/MHT/Case Management/Adjunct)  01/14/2012 1:15 PM  Type of Therapy:  Group Therapy, Dance/Movement Therapy   Participation Level:  None  Participation Quality:  Drowsy and Inattentive  Affect:  Depressed  Cognitive:  Oriented  Insight:  None  Engagement in Group:  None  Engagement in Therapy:  None  Modes of Intervention:  Clarification, Problem-solving, Role-play, Socialization and Support  Summary of Progress/Problems:  Group discussed triggers and obstacles in their recovery and brainstormed ways to deal with them. Pt shared that he was depressed about the recent death of his son and that he did not want to participate in group but would just watch. Near the end of group, the pt was snoring and he left the group to go to his room. This suggests that the pt is not wanting to active in recovery at this time.  Theda Sers, [employer]

## 2012-01-14 NOTE — Progress Notes (Signed)
Slept fair last nite, appetite is improving, energy level is low and ability to pay attention is improving, depressed 8/10 and hopeless 7/10, denies Si or Hi, still sad about brother's death but has decided not to attend the funeral, attending group, c/o body achiness. q20mn safety checks continue and support offered, prn Tylenol given for achiness. Safety maintained

## 2012-01-14 NOTE — Progress Notes (Signed)
Patient ID: Chad Mohs., male   DOB: 1961/05/29, 51 y.o.   MRN: [id number] The patient has a flat affect and depressed mood. He attended and participated in the evening substance abuse group. Stated he was not having a good evening and identified his stressor as family problems. He is dealing with grief related to the recent death of his brother and making the decision not to attend the funeral. Support given.

## 2012-01-14 NOTE — Progress Notes (Signed)
Pt sitting in dayroom playing cards with peers on approach.  Denies complaints at this time.  Pt did state that he wished to speak with the doctor tomorrow and asked what time to expect the doctor to be in.  Pt denies SI/HI/hallucinations at present.  Support and encouragement offered, will continue to monitor.
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
## 2012-01-15 MED ORDER — LOPERAMIDE HCL 2 MG PO CAPS
4.0000 mg | ORAL_CAPSULE | Freq: Once | ORAL | Status: AC
  Administered 2012-01-15: 4 mg via ORAL
  Filled 2012-01-15: qty 2

## 2012-01-15 MED ORDER — LOPERAMIDE HCL 2 MG PO CAPS
2.0000 mg | ORAL_CAPSULE | ORAL | Status: DC | PRN
  Administered 2012-01-16 (×2): 2 mg via ORAL
  Administered 2012-01-16: 4 mg via ORAL
  Administered 2012-01-16 – 2012-01-17 (×3): 2 mg via ORAL
  Filled 2012-01-15: qty 1

## 2012-01-15 NOTE — Progress Notes (Signed)
Pt has been up and has been visible in milieu today, pt has not had any suicidal thoughts and has not had any signs or symptoms of withdrawal, pt stated that he is sleeping well, has endorsed some feelings of depression, pt has received all medications without incident, support provided, will continue to monitor

## 2012-01-15 NOTE — Progress Notes (Signed)
Patient ID: Chad Mohs., male   DOB: [birth year], 51 y.o.   MRN: [id number]  [facility] Notes:  (Counselor/Nursing/MHT/Case Management/Adjunct)  01/15/2012 1:15 PM  Type of Therapy:  Group Therapy, Dance/Movement Therapy   Participation Level:  Did Not Attend   Chad Turner
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
## 2012-01-15 NOTE — Progress Notes (Signed)
Pt in dayroom appropriately interacting with peers.  No complaints voiced, denies any further diarrhea.  Pt does continue to state that he wishes to speak to his doctor.  Pt inquired about what time to expect the doctor to be here tomorrow.  Pt denies SI/HI/hallucinations, or withdrawal.  Support and encouragement offered, will continue to monitor.

## 2012-01-15 NOTE — Progress Notes (Signed)
CIWA discontinued per protocol, has been less than 4 x 48 hours
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
## 2012-01-16 NOTE — Progress Notes (Signed)
Patient ID: Chad Mohs., male   DOB: October 14, 1961, 51 y.o.   MRN: [id number]

## 2012-01-16 NOTE — Progress Notes (Signed)
[facility] Notes:  (Counselor/Nursing/MHT/Case Management/Adjunct)  01/16/2012 3:15 PM  Type of Therapy:  Group Therapy at 11:15  Participation Level:  Active  Participation Quality:  Appropriate  Affect:  Appropriate  Cognitive:  Appropriate  Insight:  Good  Engagement in Group:  Good  Engagement in Therapy:  Good  Modes of Intervention:  Clarification, Education and Socialization  Summary of Progress/Problems:  Braiden was attentive to group discussion and shared that "I am happy to put an end to my sad story about losing everything"  Pt identified with judgement others put on addiction he took personal and thus creates cycle of addiction and use.    Lyla Glassing 01/16/2012, 3:18 PM   [facility] Group Notes:  (Counselor/Nursing/MHT/Case Management/Adjunct)  01/16/2012 3:18 PM  Type of Therapy:  Group Therapy at 1:15PM  Participation Level:  Active  Participation Quality:  Appropriate, Attentive and Sharing  Affect:  Appropriate  Cognitive:  Appropriate  Insight:  Good  Engagement in Group:  Good  Engagement in Therapy:  Good  Modes of Intervention:  Clarification, Education and Support  Summary of Progress/Problems:  Sai shared at multiple points during presentation by [facility] how his disease "lies to me" and "how grateful I am to be here instead of at home losing everything I worked so hard for."   Lyla Glassing 01/16/2012, 3:22 PM

## 2012-01-16 NOTE — Tx Team (Signed)
Interdisciplinary Treatment Plan Update (Adult)  Date:  01/16/2012  Time Reviewed:  9:42 AM   Progress in Treatment: Attending groups: Yes Participating in groups:  Yes Taking medication as prescribed: Yes Tolerating medication:  Yes Family/Significant othe contact made:  No Patient understands diagnosis:  Yes Discussing patient identified problems/goals with staff:  Yes Medical problems stabilized or resolved:  Yes Denies suicidal/homicidal ideation: Yes Issues/concerns per patient self-inventory:  None identified Other: N/A  New problem(s) identified: None Identified  Reason for Continuation of Hospitalization: Stable to d/c tomorrow  Interventions implemented related to continuation of hospitalization: Stable to d/c tomorrow  Additional comments: N/A  Estimated length of stay: D/C tomorrow  Discharge Plan: Pt will go to [facility] tomorrow for further inpatient SA treatment  New goal(s): N/A  Review of initial/current patient goals per problem list:    1.  Goal(s): Address substance use  Met:  Yes  Target date: by discharge  As evidenced by: completed detox protocol and referred to appropriate treatment  2.  Goal (s): Reduce depressive and anxiety symptoms  Met:  No  Target date: by discharge  As evidenced by: Reducing depression from a 10 to a 3 as reported by pt.  Pt ranks at a 7 but reports feeling ready to d/c tomorrow to treatment.  Pt denies having anxiety.   3.  Goal(s): Eliminate SI  Met:  Yes  Target date: by discharge  As evidenced by: Pt denies SI.    Attendees: Patient:  Chad Turner 01/16/2012 9:46 AM   Family:     Physician:  Cheryll Cockayne, DO 01/16/2012 9:42 AM   Nursing: Satira Sark, RN 01/16/2012 9:42 AM   Case Manager:  Regan Lemming, [place] 01/16/2012  9:42 AM   Counselor:  Frutoso Chase, LCSWA 01/16/2012  9:42 AM   Other:  Ripley Fraise, LCSW 01/16/2012 9:42 AM   Other:  Nena Polio, PA 01/16/2012 9:46 AM   Other:      Other:      Scribe for Treatment Team:   Regan Lemming 01/16/2012 9:42 AM
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
## 2012-01-16 NOTE — Progress Notes (Signed)
Pt attended discharge planning group and actively participated.  Pr presents with flat affect and depressed mood.  Pt ranks depression at a 7 today and denies anxiety and SI today.  Pt states that he is still sad about the loss of his brother.  Pt is going to [facility] tomorrow for further substance abuse treatment.  Pt states his mom will pick him up tomorrow morning and transport him to [facility].  No further needs voiced by pt at this time.   Regan Lemming, LCSWA 01/16/2012  10:14 AM    Per State Regulation 482.30 This Chart was reviewed for medical necessity with respect to the patient's Admission/Duration of stay.   Ane Payment  01/16/2012  Next Review Date:  01/18/12

## 2012-01-16 NOTE — Progress Notes (Signed)
Pt. Pleasant and cooperative.  Pt. In bed asleep and did not go to recreational therapy.  Denies SI/HI and A/V hallucinations.  PRN given for diarrhea.  Reports relief at present.

## 2012-01-17 MED ORDER — GABAPENTIN 100 MG PO CAPS: 100.0000 mg | ORAL_CAPSULE | Freq: Three times a day (TID) | ORAL | Status: DC

## 2012-01-17 MED ORDER — TRAZODONE HCL 150 MG PO TABS: 150.0000 mg | ORAL_TABLET | Freq: Every evening | ORAL | Status: DC | PRN

## 2012-01-17 MED ORDER — ATENOLOL 50 MG PO TABS: 50.0000 mg | ORAL_TABLET | Freq: Every day | ORAL | Status: DC

## 2012-01-17 NOTE — Progress Notes (Signed)
[facility] Case Management Discharge Plan:  Will you be returning to the same living situation after discharge: Yes,  pt will return home after tx At discharge, do you have transportation home?:Yes,  pt's mom to transport Do you have the ability to pay for your medications:Yes,  access to meds  Release of information consent forms completed and in the chart;  Patient's signature needed at discharge.  Patient to Follow up at:  Follow-up Information    Follow up with [facility]  on 01/17/2012. (Arrive there at [place] promptly!!)    Contact information:   [address] [place], [place] [address] [phone]         Patient denies SI/HI:   Yes,  denies SI/HI    Safety Planning and Suicide Prevention discussed:  Yes,  discussed with pt  Barrier to discharge identified:No.  Summary and Recommendations: Pt went to [facility] for further SA treatment today. No recommendations from SW.  No further needs voiced by pt.  Pt stable to discharge.     Ane Payment 01/17/2012, 9:34 AM

## 2012-01-17 NOTE — Progress Notes (Signed)
Patient ID: Chad Mohs., male   DOB: 03/14/1961, 51 y.o.   MRN: [id number] [facility] , [facility]. Pt voiced understanding of discharge plan. and of follow-up. He was given Imodium  prior  To discharge for diarrhea. Was given AM medication prior to discharge. He denies  Thoughts of SI, all belonging taken home with him.
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
## 2012-01-19 ENCOUNTER — Emergency Department (HOSPITAL_BASED_OUTPATIENT_CLINIC_OR_DEPARTMENT_OTHER)
Admission: EM | Admit: 2012-01-19 | Discharge: 2012-01-19 | Disposition: A | Discharge status: Home / Self Care | Payer: Medicare Other | Attending: Emergency Medicine | Admitting: Emergency Medicine

## 2012-01-19 ENCOUNTER — Encounter (HOSPITAL_BASED_OUTPATIENT_CLINIC_OR_DEPARTMENT_OTHER): Payer: Self-pay | Admitting: *Deleted

## 2012-01-19 DIAGNOSIS — L298 Other pruritus: Secondary | ICD-10-CM | POA: Insufficient documentation

## 2012-01-19 DIAGNOSIS — Z79899 Other long term (current) drug therapy: Secondary | ICD-10-CM | POA: Insufficient documentation

## 2012-01-19 DIAGNOSIS — R21 Rash and other nonspecific skin eruption: Secondary | ICD-10-CM | POA: Insufficient documentation

## 2012-01-19 DIAGNOSIS — L2989 Other pruritus: Secondary | ICD-10-CM | POA: Insufficient documentation

## 2012-01-19 DIAGNOSIS — I10 Essential (primary) hypertension: Secondary | ICD-10-CM | POA: Insufficient documentation

## 2012-01-19 DIAGNOSIS — R197 Diarrhea, unspecified: Secondary | ICD-10-CM | POA: Insufficient documentation

## 2012-01-19 DIAGNOSIS — J45909 Unspecified asthma, uncomplicated: Secondary | ICD-10-CM | POA: Insufficient documentation

## 2012-01-19 LAB — DIFFERENTIAL
Basophils Absolute: 0 10*3/uL (ref 0.0–0.1)
Basophils Relative: 0 % (ref 0–1)
Eosinophils Absolute: 0.2 10*3/uL (ref 0.0–0.7)
Eosinophils Relative: 4 % (ref 0–5)
Lymphocytes Relative: 23 % (ref 12–46)
Lymphs Abs: 1.3 10*3/uL (ref 0.7–4.0)
Monocytes Absolute: 0.8 10*3/uL (ref 0.1–1.0)
Monocytes Relative: 14 % — ABNORMAL HIGH (ref 3–12)
Neutro Abs: 3.5 10*3/uL (ref 1.7–7.7)
Neutrophils Relative %: 60 % (ref 43–77)

## 2012-01-19 LAB — COMPREHENSIVE METABOLIC PANEL
ALT: 56 U/L — ABNORMAL HIGH (ref 0–53)
AST: 31 U/L (ref 0–37)
Albumin: 3.6 g/dL (ref 3.5–5.2)
Alkaline Phosphatase: 80 U/L (ref 39–117)
BUN: 5 mg/dL — ABNORMAL LOW (ref 6–23)
CO2: 22 mEq/L (ref 19–32)
Calcium: 9.4 mg/dL (ref 8.4–10.5)
Chloride: 105 mEq/L (ref 96–112)
Creatinine, Ser: 0.9 mg/dL (ref 0.50–1.35)
GFR calc Af Amer: >90 mL/min (ref >90)
GFR calc non Af Amer: >90 mL/min (ref >90)
Glucose, Bld: 114 mg/dL — ABNORMAL HIGH (ref 70–99)
Potassium: 3.8 mEq/L (ref 3.5–5.1)
Sodium: 137 mEq/L (ref 135–145)
Total Bilirubin: 0.6 mg/dL (ref 0.3–1.2)
Total Protein: 7.6 g/dL (ref 6.0–8.3)

## 2012-01-19 LAB — CBC
HCT: 38.8 % — ABNORMAL LOW (ref 39.0–52.0)
Hemoglobin: 13.1 g/dL (ref 13.0–17.0)
MCH: 28.9 pg (ref 26.0–34.0)
MCHC: 33.8 g/dL (ref 30.0–36.0)
MCV: 85.5 fL (ref 78.0–100.0)
Platelets: 245 10*3/uL (ref 150–400)
RBC: 4.54 MIL/uL (ref 4.22–5.81)
RDW: 13.8 % (ref 11.5–15.5)
WBC: 5.8 10*3/uL (ref 4.0–10.5)

## 2012-01-19 LAB — LIPASE, BLOOD: Lipase: 48 U/L (ref 11–59)

## 2012-01-19 MED ORDER — CARRINGTON MOISTURE BARRIER EX CREA: TOPICAL_CREAM | CUTANEOUS | Status: DC | PRN

## 2012-01-19 MED ORDER — LOPERAMIDE HCL 2 MG PO CAPS: 2.0000 mg | ORAL_CAPSULE | Freq: Four times a day (QID) | ORAL | Status: AC | PRN

## 2012-01-19 MED ORDER — LOPERAMIDE HCL 2 MG PO CAPS
2.0000 mg | ORAL_CAPSULE | Freq: Once | ORAL | Status: AC
  Administered 2012-01-19: 2 mg via ORAL
  Filled 2012-01-19: qty 1

## 2012-01-19 MED ORDER — SODIUM CHLORIDE 0.9 % IV BOLUS (SEPSIS)
1000.0000 mL | Freq: Once | INTRAVENOUS | Status: AC
  Administered 2012-01-19: 1000 mL via INTRAVENOUS

## 2012-01-19 NOTE — ED Notes (Signed)
Patient states he has had diarrhea for one week.  States every time he eats he has diarrhea up to 4-5 times per day.  States he also has a itchy rash in his groin area.

## 2012-01-19 NOTE — Discharge Instructions (Signed)
Your evaluation today in the emergency department did not demonstrate a specific cause of your diarrhea.  Your condition is most likely due to colitis.  Because you do not have a fever, and are otherwise well he has not been prescribed new antibiotics.  Your diarrhea may also be due to new medication changes, diet changes.  It is very important that you followup as directed.  It is also important that you take the medication prescribed for you as directed.  If you develop any new, or concerning changes in your condition, please return to the emergency department immediately.
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
## 2012-01-19 NOTE — ED Notes (Signed)
Pt given pilgrim hat, specimen cup and instructions for obtaining stool sample for testing. Verbalizes understanding.

## 2012-01-20 NOTE — Progress Notes (Signed)
Patient Discharge Instructions:  No consent for [facility].  [name], 01/20/2012, 1:32 PM

## 2012-01-23 LAB — STOOL CULTURE: Special Requests: NORMAL

## 2012-01-25 ENCOUNTER — Encounter (HOSPITAL_BASED_OUTPATIENT_CLINIC_OR_DEPARTMENT_OTHER): Payer: Self-pay | Admitting: Family Medicine

## 2012-01-25 ENCOUNTER — Emergency Department (HOSPITAL_BASED_OUTPATIENT_CLINIC_OR_DEPARTMENT_OTHER)
Admission: EM | Admit: 2012-01-25 | Discharge: 2012-01-25 | Disposition: A | Discharge status: Home / Self Care | Payer: Medicare Other | Attending: Emergency Medicine | Admitting: Emergency Medicine

## 2012-01-25 DIAGNOSIS — J45909 Unspecified asthma, uncomplicated: Secondary | ICD-10-CM | POA: Insufficient documentation

## 2012-01-25 DIAGNOSIS — I10 Essential (primary) hypertension: Secondary | ICD-10-CM | POA: Insufficient documentation

## 2012-01-25 DIAGNOSIS — R21 Rash and other nonspecific skin eruption: Secondary | ICD-10-CM

## 2012-01-25 MED ORDER — PERMETHRIN 5 % EX CREA: TOPICAL_CREAM | CUTANEOUS | Status: AC

## 2012-01-25 MED ORDER — PERMETHRIN 5 % EX CREA: TOPICAL_CREAM | CUTANEOUS | Status: DC
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
## 2012-01-25 NOTE — ED Notes (Signed)
Pt c/o rash to inner thigh.

## 2012-01-26 NOTE — ED Provider Notes (Signed)
Evaluation and management procedures were performed by the PA/NP under my supervision/collaboration.   Charlena Cross, MD 01/26/12 1013

## 2012-03-13 ENCOUNTER — Ambulatory Visit: Payer: Medicare Other

## 2012-05-08 ENCOUNTER — Encounter (HOSPITAL_COMMUNITY): Payer: Self-pay | Admitting: *Deleted

## 2012-05-08 ENCOUNTER — Emergency Department (HOSPITAL_COMMUNITY): Payer: Medicare Other

## 2012-05-08 ENCOUNTER — Emergency Department (HOSPITAL_COMMUNITY)
Admission: EM | Admit: 2012-05-08 | Discharge: 2012-05-08 | Disposition: A | Discharge status: Home / Self Care | Payer: Medicare Other | Attending: Emergency Medicine | Admitting: Emergency Medicine

## 2012-05-08 DIAGNOSIS — R112 Nausea with vomiting, unspecified: Secondary | ICD-10-CM

## 2012-05-08 DIAGNOSIS — I10 Essential (primary) hypertension: Secondary | ICD-10-CM | POA: Insufficient documentation

## 2012-05-08 DIAGNOSIS — R10819 Abdominal tenderness, unspecified site: Secondary | ICD-10-CM | POA: Insufficient documentation

## 2012-05-08 DIAGNOSIS — R109 Unspecified abdominal pain: Secondary | ICD-10-CM | POA: Insufficient documentation

## 2012-05-08 DIAGNOSIS — K769 Liver disease, unspecified: Secondary | ICD-10-CM | POA: Insufficient documentation

## 2012-05-08 DIAGNOSIS — K7689 Other specified diseases of liver: Secondary | ICD-10-CM | POA: Insufficient documentation

## 2012-05-08 DIAGNOSIS — F1010 Alcohol abuse, uncomplicated: Secondary | ICD-10-CM

## 2012-05-08 DIAGNOSIS — R10816 Epigastric abdominal tenderness: Secondary | ICD-10-CM | POA: Insufficient documentation

## 2012-05-08 LAB — CBC WITH DIFFERENTIAL/PLATELET
Basophils Absolute: 0 10*3/uL (ref 0.0–0.1)
Basophils Relative: 1 % (ref 0–1)
Eosinophils Absolute: 0 10*3/uL (ref 0.0–0.7)
Eosinophils Relative: 1 % (ref 0–5)
HCT: 35.2 % — ABNORMAL LOW (ref 39.0–52.0)
Hemoglobin: 12.2 g/dL — ABNORMAL LOW (ref 13.0–17.0)
Lymphocytes Relative: 48 % — ABNORMAL HIGH (ref 12–46)
Lymphs Abs: 1.7 10*3/uL (ref 0.7–4.0)
MCH: 28.6 pg (ref 26.0–34.0)
MCHC: 34.7 g/dL (ref 30.0–36.0)
MCV: 82.4 fL (ref 78.0–100.0)
Monocytes Absolute: 0.4 10*3/uL (ref 0.1–1.0)
Monocytes Relative: 11 % (ref 3–12)
Neutro Abs: 1.3 10*3/uL — ABNORMAL LOW (ref 1.7–7.7)
Neutrophils Relative %: 39 % — ABNORMAL LOW (ref 43–77)
Platelets: 310 10*3/uL (ref 150–400)
RBC: 4.27 MIL/uL (ref 4.22–5.81)
RDW: 16.7 % — ABNORMAL HIGH (ref 11.5–15.5)
WBC: 3.4 10*3/uL — ABNORMAL LOW (ref 4.0–10.5)

## 2012-05-08 LAB — URINALYSIS, DIPSTICK ONLY
Bilirubin Urine: NEGATIVE
Glucose, UA: NEGATIVE mg/dL
Hgb urine dipstick: NEGATIVE
Ketones, ur: NEGATIVE mg/dL
Leukocytes, UA: NEGATIVE
Nitrite: NEGATIVE
Protein, ur: NEGATIVE mg/dL
Specific Gravity, Urine: 1.02 (ref 1.005–1.030)
Urobilinogen, UA: 1 mg/dL (ref 0.0–1.0)
pH: 5 (ref 5.0–8.0)

## 2012-05-08 LAB — COMPREHENSIVE METABOLIC PANEL
ALT: 147 U/L — ABNORMAL HIGH (ref 0–53)
AST: 241 U/L — ABNORMAL HIGH (ref 0–37)
Albumin: 3.1 g/dL — ABNORMAL LOW (ref 3.5–5.2)
Alkaline Phosphatase: 157 U/L — ABNORMAL HIGH (ref 39–117)
BUN: 4 mg/dL — ABNORMAL LOW (ref 6–23)
CO2: 20 mEq/L (ref 19–32)
Calcium: 8.7 mg/dL (ref 8.4–10.5)
Chloride: 99 mEq/L (ref 96–112)
Creatinine, Ser: 0.74 mg/dL (ref 0.50–1.35)
GFR calc Af Amer: >90 mL/min (ref >90)
GFR calc non Af Amer: >90 mL/min (ref >90)
Glucose, Bld: 107 mg/dL — ABNORMAL HIGH (ref 70–99)
Potassium: 3.9 mEq/L (ref 3.5–5.1)
Sodium: 132 mEq/L — ABNORMAL LOW (ref 135–145)
Total Bilirubin: 1.3 mg/dL — ABNORMAL HIGH (ref 0.3–1.2)
Total Protein: 6.7 g/dL (ref 6.0–8.3)

## 2012-05-08 LAB — PROTIME-INR
INR: 1.08 (ref 0.00–1.49)
Prothrombin Time: 14.2 seconds (ref 11.6–15.2)

## 2012-05-08 LAB — LIPASE, BLOOD: Lipase: 160 U/L — ABNORMAL HIGH (ref 11–59)

## 2012-05-08 MED ORDER — HYDROMORPHONE HCL PF 1 MG/ML IJ SOLN
1.0000 mg | Freq: Once | INTRAMUSCULAR | Status: AC
  Administered 2012-05-08: 1 mg via INTRAVENOUS
  Filled 2012-05-08: qty 1

## 2012-05-08 MED ORDER — ONDANSETRON HCL 4 MG/2ML IJ SOLN
4.0000 mg | Freq: Once | INTRAMUSCULAR | Status: AC
  Administered 2012-05-08: 4 mg via INTRAVENOUS
  Filled 2012-05-08: qty 2

## 2012-05-08 MED ORDER — IOHEXOL 300 MG/ML  SOLN
100.0000 mL | Freq: Once | INTRAMUSCULAR | Status: AC | PRN
  Administered 2012-05-08: 100 mL via INTRAVENOUS

## 2012-05-08 MED ORDER — SODIUM CHLORIDE 0.9 % IV SOLN
Freq: Once | INTRAVENOUS | Status: AC
  Administered 2012-05-08: 11:00:00 via INTRAVENOUS

## 2012-05-08 NOTE — ED Notes (Signed)
Pt alert and oriented x4. Respirations even and unlabored, bilateral symmetrical rise and fall of chest. Skin warm and dry. In no acute distress. Denies needs.   
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
## 2012-05-08 NOTE — ED Notes (Signed)
Pt states "been having stomach pain for 6 days & vomiting, can't eat anything, if I could I think I would feel better, I drink a lot, maybe that's the problem, drink 5 40 ounces a day"

## 2012-07-05 ENCOUNTER — Emergency Department (HOSPITAL_COMMUNITY)
Admission: EM | Admit: 2012-07-05 | Discharge: 2012-07-06 | Disposition: A | Discharge status: Other Acute Inpatient Hospital | Payer: Medicare Other | Source: Home / Self Care | Attending: Emergency Medicine | Admitting: Emergency Medicine

## 2012-07-05 ENCOUNTER — Encounter (HOSPITAL_COMMUNITY): Payer: Self-pay | Admitting: *Deleted

## 2012-07-05 DIAGNOSIS — J45909 Unspecified asthma, uncomplicated: Secondary | ICD-10-CM | POA: Diagnosis present

## 2012-07-05 DIAGNOSIS — I10 Essential (primary) hypertension: Secondary | ICD-10-CM | POA: Diagnosis present

## 2012-07-05 DIAGNOSIS — F10239 Alcohol dependence with withdrawal, unspecified: Principal | ICD-10-CM | POA: Diagnosis not present

## 2012-07-05 DIAGNOSIS — F10939 Alcohol use, unspecified with withdrawal, unspecified: Principal | ICD-10-CM | POA: Diagnosis not present

## 2012-07-05 DIAGNOSIS — F1420 Cocaine dependence, uncomplicated: Secondary | ICD-10-CM | POA: Diagnosis present

## 2012-07-05 DIAGNOSIS — Z9089 Acquired absence of other organs: Secondary | ICD-10-CM | POA: Insufficient documentation

## 2012-07-05 DIAGNOSIS — F1410 Cocaine abuse, uncomplicated: Secondary | ICD-10-CM | POA: Insufficient documentation

## 2012-07-05 DIAGNOSIS — F1010 Alcohol abuse, uncomplicated: Secondary | ICD-10-CM | POA: Insufficient documentation

## 2012-07-05 DIAGNOSIS — F1020 Alcohol dependence, uncomplicated: Secondary | ICD-10-CM | POA: Diagnosis present

## 2012-07-05 LAB — COMPREHENSIVE METABOLIC PANEL
ALT: 96 U/L — ABNORMAL HIGH (ref 0–53)
AST: 219 U/L — ABNORMAL HIGH (ref 0–37)
Albumin: 3.4 g/dL — ABNORMAL LOW (ref 3.5–5.2)
Alkaline Phosphatase: 187 U/L — ABNORMAL HIGH (ref 39–117)
BUN: 3 mg/dL — ABNORMAL LOW (ref 6–23)
CO2: 21 mEq/L (ref 19–32)
Calcium: 9.1 mg/dL (ref 8.4–10.5)
Chloride: 97 mEq/L (ref 96–112)
Creatinine, Ser: 0.71 mg/dL (ref 0.50–1.35)
GFR calc Af Amer: >90 mL/min (ref >90)
GFR calc non Af Amer: >90 mL/min (ref >90)
Glucose, Bld: 112 mg/dL — ABNORMAL HIGH (ref 70–99)
Potassium: 4.2 mEq/L (ref 3.5–5.1)
Sodium: 131 mEq/L — ABNORMAL LOW (ref 135–145)
Total Bilirubin: 1.4 mg/dL — ABNORMAL HIGH (ref 0.3–1.2)
Total Protein: 7.8 g/dL (ref 6.0–8.3)

## 2012-07-05 LAB — CBC
HCT: 35.3 % — ABNORMAL LOW (ref 39.0–52.0)
Hemoglobin: 12.2 g/dL — ABNORMAL LOW (ref 13.0–17.0)
MCH: 30 pg (ref 26.0–34.0)
MCHC: 34.6 g/dL (ref 30.0–36.0)
MCV: 86.7 fL (ref 78.0–100.0)
Platelets: 219 10*3/uL (ref 150–400)
RBC: 4.07 MIL/uL — ABNORMAL LOW (ref 4.22–5.81)
RDW: 13.5 % (ref 11.5–15.5)
WBC: 4.5 10*3/uL (ref 4.0–10.5)

## 2012-07-05 LAB — RAPID URINE DRUG SCREEN, HOSP PERFORMED
Amphetamines: NOT DETECTED
Barbiturates: NOT DETECTED
Benzodiazepines: NOT DETECTED
Cocaine: POSITIVE — AB
Opiates: NOT DETECTED
Tetrahydrocannabinol: NOT DETECTED

## 2012-07-05 LAB — ETHANOL: Alcohol, Ethyl (B): 179 mg/dL — ABNORMAL HIGH (ref 0–11)

## 2012-07-05 LAB — ACETAMINOPHEN LEVEL: Acetaminophen (Tylenol), Serum: <15 ug/mL (ref 10–30)

## 2012-07-05 MED ORDER — VITAMIN B-1 100 MG PO TABS
100.0000 mg | ORAL_TABLET | Freq: Every day | ORAL | Status: DC
  Administered 2012-07-06: 100 mg via ORAL
  Filled 2012-07-05: qty 1

## 2012-07-05 MED ORDER — LORAZEPAM 2 MG/ML IJ SOLN: 1.0000 mg | Freq: Four times a day (QID) | INTRAMUSCULAR | Status: DC | PRN

## 2012-07-05 MED ORDER — ZOLPIDEM TARTRATE 5 MG PO TABS: 5.0000 mg | ORAL_TABLET | Freq: Every evening | ORAL | Status: DC | PRN

## 2012-07-05 MED ORDER — LORAZEPAM 1 MG PO TABS: 0.0000 mg | ORAL_TABLET | Freq: Four times a day (QID) | ORAL | Status: DC

## 2012-07-05 MED ORDER — ALUM & MAG HYDROXIDE-SIMETH 200-200-20 MG/5ML PO SUSP: 30.0000 mL | ORAL | Status: DC | PRN

## 2012-07-05 MED ORDER — ONDANSETRON HCL 4 MG PO TABS
4.0000 mg | ORAL_TABLET | Freq: Three times a day (TID) | ORAL | Status: DC | PRN
  Administered 2012-07-06: 4 mg via ORAL
  Filled 2012-07-05: qty 1

## 2012-07-05 MED ORDER — ADULT MULTIVITAMIN W/MINERALS CH
1.0000 | ORAL_TABLET | Freq: Every day | ORAL | Status: DC
  Administered 2012-07-06: 1 via ORAL
  Filled 2012-07-05: qty 1

## 2012-07-05 MED ORDER — THIAMINE HCL 100 MG/ML IJ SOLN: 100.0000 mg | Freq: Every day | INTRAMUSCULAR | Status: DC

## 2012-07-05 MED ORDER — LORAZEPAM 1 MG PO TABS: 0.0000 mg | ORAL_TABLET | Freq: Two times a day (BID) | ORAL | Status: DC

## 2012-07-05 MED ORDER — IBUPROFEN 200 MG PO TABS
600.0000 mg | ORAL_TABLET | Freq: Three times a day (TID) | ORAL | Status: DC | PRN
  Administered 2012-07-06: 600 mg via ORAL
  Filled 2012-07-05: qty 1

## 2012-07-05 MED ORDER — LORAZEPAM 1 MG PO TABS
1.0000 mg | ORAL_TABLET | Freq: Four times a day (QID) | ORAL | Status: DC | PRN
  Administered 2012-07-06: 1 mg via ORAL
  Filled 2012-07-05: qty 1

## 2012-07-05 MED ORDER — FOLIC ACID 1 MG PO TABS
1.0000 mg | ORAL_TABLET | Freq: Every day | ORAL | Status: DC
  Administered 2012-07-06: 1 mg via ORAL
  Filled 2012-07-05: qty 1

## 2012-07-05 MED ORDER — LORAZEPAM 1 MG PO TABS: 1.0000 mg | ORAL_TABLET | Freq: Three times a day (TID) | ORAL | Status: DC | PRN

## 2012-07-05 NOTE — ED Notes (Signed)
Pt reports last drink was at 5pm today.  Pt reports drinking beer.  Pt reports last usage of cocaine was yesterday
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
## 2012-07-05 NOTE — ED Notes (Signed)
Pt requesting detox from alcohol and cocaine; states was in a detox program a few months ago and left;  Denies si/hi; states having some abd pain rating 2-3

## 2012-07-05 NOTE — ED Notes (Signed)
Pt was wanded °

## 2012-07-06 ENCOUNTER — Inpatient Hospital Stay (HOSPITAL_COMMUNITY)
Admission: EM | Admit: 2012-07-06 | Discharge: 2012-07-11 | DRG: 897 | Disposition: A | Discharge status: Home / Self Care | Payer: Medicare Other | Source: Ambulatory Visit | Attending: Psychiatry | Admitting: Psychiatry

## 2012-07-06 DIAGNOSIS — F1410 Cocaine abuse, uncomplicated: Secondary | ICD-10-CM | POA: Diagnosis present

## 2012-07-06 DIAGNOSIS — F1020 Alcohol dependence, uncomplicated: Secondary | ICD-10-CM

## 2012-07-06 MED ORDER — CHLORDIAZEPOXIDE HCL 25 MG PO CAPS
25.0000 mg | ORAL_CAPSULE | Freq: Every day | ORAL | Status: AC
  Administered 2012-07-10: 25 mg via ORAL
  Filled 2012-07-06: qty 1

## 2012-07-06 MED ORDER — ONDANSETRON 4 MG PO TBDP
4.0000 mg | ORAL_TABLET | Freq: Four times a day (QID) | ORAL | Status: AC | PRN
  Administered 2012-07-08: 4 mg via ORAL

## 2012-07-06 MED ORDER — LOPERAMIDE HCL 2 MG PO CAPS: 2.0000 mg | ORAL_CAPSULE | ORAL | Status: AC | PRN

## 2012-07-06 MED ORDER — ADULT MULTIVITAMIN W/MINERALS CH
1.0000 | ORAL_TABLET | Freq: Every day | ORAL | Status: DC
  Administered 2012-07-07 – 2012-07-11 (×5): 1 via ORAL
  Filled 2012-07-06 (×7): qty 1

## 2012-07-06 MED ORDER — CHLORDIAZEPOXIDE HCL 25 MG PO CAPS
50.0000 mg | ORAL_CAPSULE | Freq: Once | ORAL | Status: AC
  Administered 2012-07-09: 50 mg via ORAL
  Filled 2012-07-06: qty 1

## 2012-07-06 MED ORDER — ALUM & MAG HYDROXIDE-SIMETH 200-200-20 MG/5ML PO SUSP
30.0000 mL | ORAL | Status: DC | PRN
  Administered 2012-07-08: 30 mL via ORAL

## 2012-07-06 MED ORDER — THIAMINE HCL 100 MG/ML IJ SOLN
100.0000 mg | Freq: Once | INTRAMUSCULAR | Status: AC
  Administered 2012-07-07: 100 mg via INTRAMUSCULAR

## 2012-07-06 MED ORDER — CHLORDIAZEPOXIDE HCL 25 MG PO CAPS
25.0000 mg | ORAL_CAPSULE | ORAL | Status: AC
  Administered 2012-07-09: 25 mg via ORAL
  Filled 2012-07-06: qty 1

## 2012-07-06 MED ORDER — ACETAMINOPHEN 325 MG PO TABS
650.0000 mg | ORAL_TABLET | Freq: Four times a day (QID) | ORAL | Status: DC | PRN
  Administered 2012-07-08: 650 mg via ORAL

## 2012-07-06 MED ORDER — MAGNESIUM HYDROXIDE 400 MG/5ML PO SUSP
30.0000 mL | Freq: Every day | ORAL | Status: DC | PRN
  Administered 2012-07-10: 30 mL via ORAL

## 2012-07-06 MED ORDER — VITAMIN B-1 100 MG PO TABS
100.0000 mg | ORAL_TABLET | Freq: Every day | ORAL | Status: DC
  Administered 2012-07-07 – 2012-07-11 (×5): 100 mg via ORAL
  Filled 2012-07-06 (×7): qty 1

## 2012-07-06 MED ORDER — CHLORDIAZEPOXIDE HCL 25 MG PO CAPS
25.0000 mg | ORAL_CAPSULE | Freq: Three times a day (TID) | ORAL | Status: AC
  Administered 2012-07-08 (×2): 25 mg via ORAL
  Filled 2012-07-06 (×4): qty 1

## 2012-07-06 MED ORDER — HYDROXYZINE HCL 25 MG PO TABS: 25.0000 mg | ORAL_TABLET | Freq: Four times a day (QID) | ORAL | Status: AC | PRN

## 2012-07-06 MED ORDER — CHLORDIAZEPOXIDE HCL 25 MG PO CAPS
25.0000 mg | ORAL_CAPSULE | Freq: Four times a day (QID) | ORAL | Status: AC | PRN
  Administered 2012-07-07 – 2012-07-08 (×2): 25 mg via ORAL
  Filled 2012-07-06 (×2): qty 1

## 2012-07-06 MED ORDER — CHLORDIAZEPOXIDE HCL 25 MG PO CAPS
25.0000 mg | ORAL_CAPSULE | Freq: Four times a day (QID) | ORAL | Status: AC
  Administered 2012-07-07 (×2): 25 mg via ORAL
  Filled 2012-07-06 (×2): qty 1
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
## 2012-07-06 NOTE — Tx Team (Signed)
Initial Interdisciplinary Treatment Plan  PATIENT STRENGTHS: (choose at least two) Ability for insight Communication skills General fund of knowledge Motivation for treatment/growth Supportive family/friends  PATIENT STRESSORS: Legal issue Substance abuse   PROBLEM LIST: Problem List/Patient Goals Date to be addressed Date deferred Reason deferred Estimated date of resolution  Substance abuse 07/06/2012                                                      DISCHARGE CRITERIA:  Ability to meet basic life and health needs Improved stabilization in mood, thinking, and/or behavior Motivation to continue treatment in a less acute level of care Verbal commitment to aftercare and medication compliance Withdrawal symptoms are absent or subacute and managed without 24-hour nursing intervention  PRELIMINARY DISCHARGE PLAN: Outpatient therapy Participate in family therapy Return to previous living arrangement  PATIENT/FAMIILY INVOLVEMENT: This treatment plan has been presented to and reviewed with the patient, Chad Turner.  The patient and family have been given the opportunity to ask questions and make suggestions.  Hoover Browns 07/06/2012, 11:35 PM

## 2012-07-06 NOTE — ED Provider Notes (Signed)
PT presented last night with ETOH and Cocaine abuse. States his last detox was about 5 months ago and he was sober a few weeks. Denies SI or HI. Denies hx of DT's. Hopefully can be discharged to [facility] or [facility] today. Waiting to see ACT   Devoria Albe, MD, Franz Dell, MD 07/06/12 [phone]

## 2012-07-06 NOTE — ED Notes (Signed)
Report called to [facility] RN 

## 2012-07-06 NOTE — [facility] Counselor (Signed)
Accepted to [facility] by Verne Spurr, PA to Dr. Koren Shiver ([phone]). Completed support documentation. Pt is voluntary & to be transported via security. Updated EDP & RN.

## 2012-07-06 NOTE — Progress Notes (Signed)
Patient ID: Chad Turner, male   DOB: [birth year], 51 y.o.   MRN: [id number] Pt admitted voluntarily to [facility] for detox from ETOH.  Pt reports drinking approximately 4 quarts a day.  Longest length of sobriety has been 9 months.  Pt reports recent relapse to the people he was hanging around. Pt states he wanted to get help because he began having problems eating and keeping food down.  He reports a weight loss of 40-50 pounds in the past few months.  Pt reports that he is on probation for a DWI but denies an upcoming court date.  Denies SI, HI and AVH.  Fifteen minute checks initiated.  Pt safe on unit.

## 2012-07-06 NOTE — ED Provider Notes (Signed)
Patient accepted to [facility] by Dr. Catha Brow.  BP 133/77  Pulse 83  Temp 98.6 F (37 C) (Oral)  Resp 16  SpO2 98%   Glynn Octave, MD 07/06/12 1842

## 2012-07-06 NOTE — ED Notes (Signed)
Bedside report received from previous RN 

## 2012-07-06 NOTE — ED Notes (Signed)
Pt with visible tremors. 

## 2012-07-06 NOTE — ED Notes (Signed)
Underwear, silver chain necklace with cross & watch removed from pt; placed in locker #34.

## 2012-07-06 NOTE — ED Provider Notes (Signed)
Medical screening examination/treatment/procedure(s) were performed by non-physician practitioner and as supervising physician I was immediately available for consultation/collaboration.   Jeronda Don B. Karle Starch, MD 07/06/12 6701

## 2012-07-06 NOTE — ED Notes (Signed)
Pt to [facility] vomiting, after returning to room, pt states "I've been having trouble eating for a couple of weeks, eat a little @ a time, I normally drink 4-5 qts of beer a day, don't do cocaine all the time"; pt informed will check on something for nausea; pt verbalized understanding.

## 2012-07-07 DIAGNOSIS — F1420 Cocaine dependence, uncomplicated: Secondary | ICD-10-CM

## 2012-07-07 DIAGNOSIS — F1020 Alcohol dependence, uncomplicated: Secondary | ICD-10-CM

## 2012-07-07 MED ORDER — ATENOLOL 50 MG PO TABS
50.0000 mg | ORAL_TABLET | Freq: Every day | ORAL | Status: DC
  Administered 2012-07-07 – 2012-07-11 (×5): 50 mg via ORAL
  Filled 2012-07-07 (×9): qty 1

## 2012-07-07 MED ORDER — TRAZODONE HCL 50 MG PO TABS
50.0000 mg | ORAL_TABLET | Freq: Every evening | ORAL | Status: DC | PRN
  Administered 2012-07-07 – 2012-07-10 (×5): 50 mg via ORAL
  Filled 2012-07-07: qty 1
  Filled 2012-07-07 (×2): qty 28
  Filled 2012-07-07 (×8): qty 1
  Filled 2012-07-07: qty 28
  Filled 2012-07-07: qty 1
  Filled 2012-07-07: qty 28
  Filled 2012-07-07 (×3): qty 1

## 2012-07-07 NOTE — Progress Notes (Signed)
Patient ID: Chad Turner, male   DOB: 12/30/1960, 51 y.o.   MRN: [id number]  Pt. attended and participated in aftercare planning group. Pt. verbally accepted information on suicide prevention, warning signs to look for with suicide and crisis line numbers to use. Pt. listed their current anxiety level as 1 and their current depression level as 1. Pt shared that he is tired of the life he has been living because he is sick and has been losing weight.

## 2012-07-07 NOTE — Progress Notes (Signed)
D.  Pt pleasant on approach, interacting appropriately within milieu.  Denies complaints other than did not sleep well last night.  Positive for evening AA group.  Denies SI/HI/hallucinations at this time.  A.  Informed Pt that he has scheduled sleep medication for tonight.  Support and encouragement offered.  R.  Pt pleased that he had available sleep medication.  Pt in dayroom, wanted to finish movie before bed.  States that it is a good movie.  No acute distress.

## 2012-07-07 NOTE — Progress Notes (Signed)
D-Patient is out in milieu interacting with peers and attending groups. A- Appropriate behavior and affect.  Denies SI. Reports low energy but good appetite. Rates depression and hopelessness at 5. Denies s/s of withdrawal.  States d/c goals are "to go to meetings".  R- Support and encouragement given.  Continue current POC and and evaluation of treatment goals. Continue 15' checks for safety.

## 2012-07-07 NOTE — Progress Notes (Signed)
Psychoeducational Group Note  Date:  07/07/2012 Time:  1515  Group Topic/Focus:  Healthy Communication:   The focus of this group is to discuss communication, barriers to communication, as well as healthy ways to communicate with others.  Participation Level:  Minimal  Participation Quality:  Appropriate, Attentive, Sharing and Supportive  Affect:  Appropriate and Flat  Cognitive:  Alert and Appropriate  Insight:  Good  Engagement in Group:  Limited  Additional Comments:  Pt attended and participated in [employer] group where pts completed the "Love Languages Quiz" and then discussed what these love languages mean in reference to their communication style. Pt had a difficult time reading the quiz but put forth great effort and finished the entire quiz by himself. Pt did not participate in group discussion but appeared engaged in discussion.    Dalia Heading 07/07/2012, 6:38 PM

## 2012-07-07 NOTE — Progress Notes (Signed)
Adult Psychosocial Assessment Update Interdisciplinary Team  Previous [facility] admissions/discharges:  Admissions Discharges  Date:01-10-12 Date:01-17-12  Date: Date:  Date: Date:  Date: Date:  Date: Date:   Changes since the last Psychosocial Assessment (including adherence to outpatient mental health and/or substance abuse treatment, situational issues contributing to decompensation and/or relapse). [facility] but left after 14 days and went home alone and relapsed on cocaine and ETHOL. Pt has been attending 12 step meetings, but has no income to get to therapy or to get bus tickets.             Discharge Plan 1. Will you be returning to the same living situation after discharge?   Yes:X No:      If no, what is your plan?    Home alone       2. Would you like a referral for services when you are discharged? Yes:X     If yes, for what services?  No:       Would like referral to therapy but is unsure how he will get there.       Summary and Recommendations (to be completed by the evaluator) Recommendations include crisis stabilization, case management, medication management, psycho-education groups to teach coping skills and group therapy.   Pt. accepted information on suicide prevention, warning signs to look for with suicide and crisis line numbers to use. The pt. agreed to call crisis line numbers if having warning signs or having thoughts of suicide.                       Signature:  Gevena Mart, 07/07/2012 8:39 AM

## 2012-07-07 NOTE — [facility] Suicide Risk Assessment (Addendum)
Suicide Risk Assessment  Admission Assessment     Nursing information obtained from:  Patient Demographic factors:  Male;Low socioeconomic status;Living alone;Unemployed Current Mental Status:  See below Loss Factors:  Legal issues, disabled, chronic pain Historical Factors: no SI attemtps Risk Reduction Factors:  NA  CLINICAL FACTORS:   Alcohol/Substance Abuse/Dependencies  COGNITIVE FEATURES THAT CONTRIBUTE TO RISK:  Closed-mindedness    SUICIDE RISK:   Minimal: No identifiable suicidal ideation.  Patients presenting with no risk factors but with morbid ruminations; may be classified as minimal risk based on the severity of the depressive symptoms  PLAN OF CARE: Mental Status Examination/Evaluation:  Objective: Appearance: Fairly Groomed   Psychomotor Activity: Normal   Eye Contact:: Good   Speech: Clear and Coherent and Normal Rate   Volume: Normal   Mood:allright   Affect: Full Range   Thought Process: Clear rational goal oriented - stop drinking   Orientation: Full   Thought Content: No AVH/psychosis   Suicidal Thoughts: No   Homicidal Thoughts: No   Judgement: Impaired   Insight: Shallow   DIAGNOSIS:  AXIS I  Alcohol and cocaine dependence   AXIS II  Can't read   AXIS III  See medical history.   AXIS IV  economic problems, educational problems, housing problems, occupational problems and problems with primary support group   AXIS V  51  Treatment Plan Summary:  Admit for medically supported alcohol detox.  Wants long term SA program   Wonda Cerise 07/07/2012, 3:58 PM

## 2012-07-07 NOTE — Progress Notes (Signed)
Patient did attend the evening speaker AA meeting.  
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
## 2012-07-07 NOTE — Progress Notes (Signed)
Patient ID: Chad Turner, male   DOB: 10/22/1961, 51 y.o.   MRN: [id number]   [facility] Group Notes:  (Counselor/Nursing/MHT/Case Management/Adjunct)  07/07/2012 1:15 PM  Type of Therapy:  Group Therapy, Dance/Movement Therapy   Participation Level:  Active  Participation Quality:  Appropriate  Affect:  Appropriate  Cognitive:  Appropriate  Insight:  Limited  Engagement in Group:  Good  Engagement in Therapy:  Good  Modes of Intervention:  Clarification, Problem-solving, Role-play, Socialization and Support  Summary of Progress/Problems: Therapist and group members discussed motivations for recovery, self-sabotaging behaviors, and the steps to take for sobriety. Group members recited and discussed the serenity prayer and were encouraged to focus on the things they can change opposed to the things they cannot. Pt shared that he cannot live this way anymore and that he needs and wants help.   Cassidi Long 07/07/2012. 2:30 PM

## 2012-07-07 NOTE — Progress Notes (Signed)
[facility] Adult Inpatient Family/Significant Other Suicide Prevention Education  Suicide Prevention Education:  Patient Refusal for Family/Significant Other Suicide Prevention Education: The patient Chad Turner has refused to provide written consent for family/significant other to be provided Family/Significant Other Suicide Prevention Education during admission and/or prior to discharge.  Physician notified.   Pt. accepted information on suicide prevention, warning signs to look for with suicide and crisis line numbers to use. The pt. agreed to call crisis line numbers if having warning signs or having thoughts of suicide.   [facility] 07/07/2012, 8:42 AM

## 2012-07-07 NOTE — H&P (Signed)
  Pt was seen by me today and I agree with the key elements documented in H&P.  

## 2012-07-08 DIAGNOSIS — F1010 Alcohol abuse, uncomplicated: Secondary | ICD-10-CM

## 2012-07-08 DIAGNOSIS — F1910 Other psychoactive substance abuse, uncomplicated: Secondary | ICD-10-CM

## 2012-07-08 NOTE — Progress Notes (Signed)
Psychoeducational Group Note  Date:  07/08/2012 Time:  1015  Group Topic/Focus:  Crisis Planning:   The purpose of this group is to help patients create a crisis plan for use upon discharge or in the future, as needed.  Participation Level:  Minimal  Participation Quality:  Attentive  Affect:  Appropriate  Cognitive:  Alert  Insight:  Limited  Engagement in Group:  Limited  Additional Comments:    Cresenciano Lick 07/08/2012, 3:12 PM

## 2012-07-08 NOTE — Progress Notes (Signed)
D.  Pt pleasant on approach.  States he has had a stomach ache all day.  No other complaint voiced.  Wishes to speak to doctor about abdominal discomfort, thinks it may be his liver.  Positive for evening AA group, no s/s of withdrawal noted.  Denies SI/HI/hallucinations at this time.  A.  Support and encouragement offered R.  Pt in dayroom eating snack and interacting with peers.  No acute distress noted.  Will continue to monitor.

## 2012-07-08 NOTE — Progress Notes (Signed)
Patient ID: FERAS GARDELLA, male   DOB: October 18, 1961, 51 y.o.   MRN: [id number]  Pt did not attend aftercare planning group but did receive a workbook.

## 2012-07-08 NOTE — Progress Notes (Signed)
Psychoeducational Group Note  Date:  07/08/2012 Time:  1515  Group Topic/Focus:  Conflict Resolution:   The focus of this group is to discuss the conflict resolution process and how it may be used upon discharge.  Participation Level:  Active  Participation Quality:  Appropriate, Attentive, Sharing and Supportive  Affect:  Anxious and Appropriate  Cognitive:  Alert, Appropriate and Oriented  Insight:  Good  Engagement in Group:  Good  Additional Comments:  Pt attended and participated in group discussing conflict resolution. Pt was able to think of a conflict in his life and identify key physical symptoms, emotional feelings, and thoughts associated with this conflict. Pt showed understanding of how these 3 concepts related to each other.   Dalia Heading 07/08/2012, 6:55 PM
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
## 2012-07-08 NOTE — Progress Notes (Signed)
Chad Turner is out in milieu interacting with peers and attending groups with minimal participation. A-Behavior is appropriate and Chad Turner states he is ready for d/c.  Denies SI/HI. Minimal s/s withdrawal. Refused noon Librium. Did not complete a patient inventory sheet. R- Support and encouragement given. Continue current POC and evaluation of treatment goals. Continue 15' checks for safety.

## 2012-07-08 NOTE — Progress Notes (Signed)
Chad Turner is c/o moderate abdominal discomfort without vomiting. States normal BM this am. Suggested to take scheduled Librium at 1700 to see if that would relieve. He went back to group.

## 2012-07-08 NOTE — Progress Notes (Signed)
Patient ID: ERLIN GARDELLA, male   DOB: June 09, 1961, 51 y.o.   MRN: [id number]   [facility] Group Notes:  (Counselor/Nursing/MHT/Case Management/Adjunct)  07/08/2012 1:15 PM  Type of Therapy:  Group Therapy, Dance/Movement Therapy   Participation Level:  Active  Participation Quality:  Appropriate  Affect:  Appropriate  Cognitive:  Appropriate  Insight:  Good  Engagement in Group:  Good  Engagement in Therapy:  Good  Modes of Intervention:  Clarification, Problem-solving, Role-play, Socialization and Support  Summary of Progress/Problems: Therapist and group members discussed the idea of control and how to exert our power to change our circumstances. Group members discussed problems they are currently dealing with and problems they will face at discharge and how they will use their power of control to combat these issues. Pt began to share towards the end of group but was active and engaged.     Cassidi Long 07/08/2012. 2:59 PM

## 2012-07-09 NOTE — Treatment Plan (Signed)
Interdisciplinary Treatment Plan Update (Adult)  Date: 07/09/2012  Time Reviewed: 4:13 PM   Progress in Treatment: Attending groups: Yes Participating in groups: Yes Taking medication as prescribed: Yes Tolerating medication: Yes   Family/Significant other contact made:  No Patient understands diagnosis:  Yes  As evidenced by asking for help with substance abuse Discussing patient identified problems/goals with staff:  Yes  See below Medical problems stabilized or resolved:  Yes Denies suicidal/homicidal ideation: Yes  In tx team Issues/concerns per patient self-inventory:  Yes  Pain in abdomen related to alcohol abuse Other:  New problem(s) identified: N/A  Reason for Continuation of Hospitalization: Medication stabilization Withdrawal symptoms  Interventions implemented related to continuation of hospitalization:   Additional comments:  Estimated length of stay: 1-2 days  Discharge Plan: see below  New goal(s): N/A  Review of initial/current patient goals per problem list:   1.  Goal(s): Safely detox from alcohol  Met:  No  Target date:9/10  As evidenced [id number] vitals, no withdrawal symptoms  2.  Goal (s): Identify comprehensive sobriety plan  Met:  Yes  Target date:9/9  As evidenced [id number] to follow up at [facility],  Declines referral to rehab  3.  Goal(s):  Met:  Yes  Target date:  As evidenced by:  4.  Goal(s):  Met:  Yes  Target date:  As evidenced by:  Attendees: Patient:  Chad Turner 07/09/2012 4:13 PM  Family:     Physician:Neil Mashburn 07/09/2012 4:13 PM   Nursing: Roswell Miners   07/09/2012 4:13 PM   Case Manager:  Richelle Ito, LCSW 07/09/2012 4:13 PM   Counselor:  Ronda Fairly, LCSWA 07/09/2012 4:13 PM   Other:     Other:     Other:     Other:      Scribe for Treatment Team:   Ida Rogue, 07/09/2012 4:13 PM

## 2012-07-09 NOTE — Progress Notes (Signed)
[facility] Group Notes:  (Counselor/Nursing/MHT/Case Management/Adjunct)  07/09/2012   Type of Therapy:  Group Therapy at 1:15 to 2:30 PM  Participation Level:  Active  Participation Quality:  Appropriate, Attentive and Sharing  Affect:  Appropriate  Cognitive:  Alert, Appropriate and Oriented  Insight:  Good  Engagement in Group:  Good  Engagement in Therapy:  Good  Modes of Intervention:  Clarification, Socialization and Support  Summary of Progress/Problems:  Group discussion focused on what patient's see as their own obstacles to recovery.  Patient shares belief (from past experiences) that isolation is difficult to deal, not having a supportive network and too much free time. Samier shared his gratitude for sponsor who came to his home to check on him and helped him get to hospital.  Mynor was able to share with group the pain and emotional strain his daughters have experienced due to his addiction. Alonza was also able to share his plan for staying clean.     [name] 07/09/2012, 3:55 PM

## 2012-07-09 NOTE — Progress Notes (Signed)
Patient did attend the second half of the  evening speaker AA meeting. Pt reported having stomach cramps at the beginning of the meeting.

## 2012-07-09 NOTE — Progress Notes (Signed)
D.  Pt. Lying in bed and reports feeling better.  Pt. Reports that he ate 100% of his meal this evening and that is more than he has eaten in 10 days. Denies SI/HI. A.  Encouragement and support given. R.  Pt. Receptive.

## 2012-07-09 NOTE — Progress Notes (Signed)
Psychoeducational Group Note  Date:  07/09/2012 Time:  1100  Group Topic/Focus:  Self Care:   The focus of this group is to help patients understand the importance of self-care in order to improve or restore emotional, physical, spiritual, interpersonal, and financial health.  Participation Level:  Active  Participation Quality:  Appropriate, Attentive and Sharing  Affect:  Appropriate  Cognitive:  Appropriate  Insight:  Good  Engagement in Group:  Good  Additional Comments:  Pt was very appropriate and attentive. Pt received assitance from staff with filling out worksheet. Pt also shared that he does eat three meals a day but wants to work on doing more fun activities.   Sharyn Lull 07/09/2012, 1:06 PM

## 2012-07-09 NOTE — Progress Notes (Signed)
[facility] In Patient Progress Note  07/09/2012 3:36 PM Chad Turner 11/21/1960 [id number]  DIAGNOSIS:  AXIS I  Alcohol and cocaine dependence   AXIS II  Can't read   AXIS III  See medical history.   AXIS IV  economic problems, educational problems, housing problems, occupational problems and problems with primary support group   AXIS V  51    ADL's:  Intact  Sleep:  Yes,  AEB:  Appetite:  Suicidal Ideation: No suicidal ideation, no plan, no intent, no means.  Homicidal Ideation:  No homicidal ideation, no plan, no intent, no means.  Subjective:   height is 5\' 7"  (1.702 m) and weight is 75.297 kg (166 lb). His oral temperature is 99.1 F (37.3 C). His blood pressure is 101/71 and his pulse is 101. His respiration is 24.   Objective:   Mental Status: Level of Consciousness:  Alert Orientation: x  General Appearance : Behavior:   Eye Contact:  Good Motor Behavior:  Normal Speech:  Normal Mood:  Depressed Affect:  Appropriate Anxiety Level:  Minimal Thought Process:  Coherent Thought Content:  WNL Perception:  Normal Judgment:  Fair Insight:  Present Cognition:  At least average Sleep:  Number of Hours: 4.5   Lab Results: No results found for this or any previous visit (from the past 48 hour(s)). Labs are reviewed. Physical Findings: AIMS: Facial and Oral Movements Muscles of Facial Expression: None, normal Lips and Perioral Area: None, normal Jaw: None, normal Tongue: None, normal,Extremity Movements Upper (arms, wrists, hands, fingers): None, normal Lower (legs, knees, ankles, toes): None, normal, Trunk Movements Neck, shoulders, hips: None, normal, Overall Severity Severity of abnormal movements (highest score from questions above): None, normal Incapacitation due to abnormal movements: None, normal Patient's awareness of abnormal movements (rate only patient's report): No Awareness, Dental Status Current problems with teeth and/or dentures?: No Does patient  usually wear dentures?: No  CIWA:  CIWA-Ar Total: 0  COWS:    Medication:  . atenolol  50 mg Oral Daily  . chlordiazePOXIDE  25 mg Oral TID   Followed by  . chlordiazePOXIDE  25 mg Oral BH-qamhs   Followed by  . chlordiazePOXIDE  25 mg Oral Daily  . chlordiazePOXIDE  50 mg Oral Once  . multivitamin with minerals  1 tablet Oral Daily  . thiamine  100 mg Oral Daily  . traZODone  50 mg Oral QHS,MR X 1   Treatment Plan Summary: Daily contact with patient to assess and evaluate symptoms and progress in treatment Medication management Plan: 1. Continue librium as planned for detox. 2. Continue atenolol as written for BP and calming effect. 3. Continue multivitamins as written. 4. Continue Thiamine 100mg  po qd. 5. Continue Trazodone 50mg  for sleep. Rona Ravens. Alysson Geist PAC 07/09/2012, 3:36 PM

## 2012-07-09 NOTE — Progress Notes (Signed)
Patient ID: Chad Turner, male   DOB: [birth year], 51 y.o.   MRN: [id number] He has been up and about and to groups interacting with peers and staff. Self inventory: depressed 5, Hopeless 8,  Denies withdrawal ,s.

## 2012-07-09 NOTE — Discharge Planning (Signed)
New patient attended AM group, good participation.  States he ended up in ED because he could not keep down any food due to amount of alcohol intake.  Declines referral to rehab as he states he will lose his house if he goes to rehab.  Plans to return home, follow up at [facility].

## 2012-07-10 NOTE — Progress Notes (Signed)
Psychoeducational Group Note  Date:  07/10/2012 Time:  1100  Group Topic/Focus:  Recovery Goals:   The focus of this group is to identify appropriate goals for recovery and establish a plan to achieve them.  Participation Level:  Active  Participation Quality:  Appropriate, Attentive and Sharing  Affect:  Appropriate  Cognitive:  Appropriate  Insight:  Good  Engagement in Group:  Good  Additional Comments:  Pt was appropriate and attentive while attending groups. Pt received assistance from staff in completing the worksheet. Pt also came up with a goal of attending 90 out of 90 AA meeting when discharged.   Sharyn Lull 07/10/2012, 1:28 PM

## 2012-07-10 NOTE — Discharge Planning (Signed)
Kasey attended AM group, good participation.  Let him know I got him a follow up appointment for Friday at [facility].  Likely d/c tomorrow.

## 2012-07-10 NOTE — Progress Notes (Signed)
[facility] Group Notes:  (Counselor/Nursing/MHT/Case Management/Adjunct)  07/10/2012 5:26 PM  Type of Therapy:  Psychoeducational Skills  Participation Level:  Active  Participation Quality:  Appropriate, Attentive and Sharing  Affect:  Appropriate  Cognitive:  Alert, Appropriate and Oriented  Insight:  Good  Engagement in Group:  Good  Engagement in Therapy:  n/a  Modes of Intervention:  Activity, Education, Problem-solving, Socialization and Support  Summary of Progress/Problems: Chad Turner attended psycho education group that focused in using quality time with support systems/individuals to engage in healthy coping skills and stregthen their healthy support system. Chad Turner participated in activity guessing about self and peers. Chad Turner was active while group discussed who their supports are, how they can spend quality time with them as a coping skill and a way to strengthen the relationship. Chad Turner was given a homework assignment to find two ways to improve their support systems and twenty activities they can do to spend quality time with supports.    [name] 07/10/2012, 5:26 PM
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
## 2012-07-10 NOTE — [facility] Counselor (Signed)
[facility] Group Notes:  (Counselor/Nursing/MHT/Case Management/Adjunct)  07/10/2012   Type of Therapy:  Group Therapy at 1:15 to 2:30  Participation Level:  Active  Participation Quality:  Appropriate, Attentive and Sharing  Affect:  Appropriate  Cognitive:  Appropriate  Insight:  Improving  Engagement in Group:  Good  Engagement in Therapy:  Good  Modes of Intervention:  Education, Socialization and Support  Summary of Progress/Problems: Patient attended group presentation by Interior and spatial designer of  [employer] ([employer]). Chad Turner  was attentive and appropriate during session.     [name] 07/10/2012,

## 2012-07-10 NOTE — Progress Notes (Signed)
D:Pt. Denies SI,HI, & AVH. Pt is not having any W/D S/S. Wants long term care.Pt is attending groups;pleasant & cooperative.A: Supported & encouraged.Continues on 15 minute checks. R: Pt safety maintained.

## 2012-07-10 NOTE — Progress Notes (Signed)
Patient resting quietly with eyes closed. Respirations even and unlabored. No distress noted. Q 15 minute check continues to maintain safety   

## 2012-07-11 DIAGNOSIS — F1410 Cocaine abuse, uncomplicated: Secondary | ICD-10-CM

## 2012-07-11 MED ORDER — TRAZODONE HCL 50 MG PO TABS: 50.0000 mg | ORAL_TABLET | Freq: Every evening | ORAL | Status: DC | PRN

## 2012-07-11 NOTE — Progress Notes (Signed)
Patient ID: Chad Turner, male   DOB: [birth year], 51 y.o.   MRN: [id number] He has been up and about and to groups interacting with peers and staff . Say that he feels ready to go home. He denies having any discomfort and no withdrawal symptoms.

## 2012-07-11 NOTE — Progress Notes (Signed)
Discussed with team, agree with assessment and plan.  Read and reviewed. 

## 2012-07-11 NOTE — Progress Notes (Signed)
[facility] Case Management Discharge Plan:  Will you be returning to the same living situation after discharge: Yes,  moms house At discharge, do you have transportation home?:Yes,  son in law Do you have the ability to pay for your medications: medicare  Interagency Information:     Release of information consent forms completed and in the chart;  Patient's signature needed at discharge.  Patient to Follow up at:  Follow-up Information    Follow up with [facility]. On 07/13/2012. (10:00 assessment with Cyndia Bent)    Contact information:   [address]  [place]  [phone]      Follow up with [facility]. (Walk-in between 8-9am Monday through Friday)    Contact information:   [address] [place], [place] [address] [phone]         Patient denies SI/HI:   Yes,  yes    Safety Planning and Suicide Prevention discussed:  Yes,  yes  Barrier to discharge identified:No.  Summary and Recommendations:   Chad Turner, Chad Turner 07/11/2012, 12:00 PM

## 2012-07-11 NOTE — Progress Notes (Signed)
Patient ID: Chad Turner, male   DOB: 11-01-1960, 51 y.o.   MRN: [id number] Pt discharged . Voiced understanding of discharge instruction and of follow- up plan. He denies thoughts of SI and HI. All belonging taken home with him. He said that he was going to get the bus to his mother house and stay there a few days.
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
## 2012-07-13 NOTE — Progress Notes (Signed)
Patient Discharge Instructions:  After Visit Summary (AVS):   Faxed to:  07/13/2012 Psychiatric Admission Assessment Note:   Faxed to:  07/13/2012 Suicide Risk Assessment - Discharge Assessment:   Faxed to:  07/13/2012 Faxed/Sent to the Next Level Care provider:  07/13/2012  Faxed to [facility] @ [phone] And to [facility] @ [phone]   [name], 07/13/2012, 2:04 PM
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
## 2012-12-10 ENCOUNTER — Emergency Department (HOSPITAL_COMMUNITY)
Admission: EM | Admit: 2012-12-10 | Discharge: 2012-12-10 | Disposition: A | Discharge status: Other Acute Inpatient Hospital | Payer: Medicare Other | Source: Home / Self Care | Attending: Emergency Medicine | Admitting: Emergency Medicine

## 2012-12-10 ENCOUNTER — Inpatient Hospital Stay (HOSPITAL_COMMUNITY)
Admission: EM | Admit: 2012-12-10 | Discharge: 2012-12-17 | DRG: 897 | Disposition: A | Discharge status: Home / Self Care | Payer: Medicare Other | Source: Intra-hospital | Attending: Psychiatry | Admitting: Psychiatry

## 2012-12-10 ENCOUNTER — Encounter (HOSPITAL_COMMUNITY): Payer: Self-pay | Admitting: Emergency Medicine

## 2012-12-10 ENCOUNTER — Encounter (HOSPITAL_COMMUNITY): Payer: Self-pay | Admitting: *Deleted

## 2012-12-10 DIAGNOSIS — R112 Nausea with vomiting, unspecified: Secondary | ICD-10-CM | POA: Insufficient documentation

## 2012-12-10 DIAGNOSIS — F1410 Cocaine abuse, uncomplicated: Secondary | ICD-10-CM | POA: Insufficient documentation

## 2012-12-10 DIAGNOSIS — Z79899 Other long term (current) drug therapy: Secondary | ICD-10-CM

## 2012-12-10 DIAGNOSIS — R1011 Right upper quadrant pain: Secondary | ICD-10-CM | POA: Insufficient documentation

## 2012-12-10 DIAGNOSIS — F1010 Alcohol abuse, uncomplicated: Secondary | ICD-10-CM | POA: Insufficient documentation

## 2012-12-10 DIAGNOSIS — J45909 Unspecified asthma, uncomplicated: Secondary | ICD-10-CM | POA: Insufficient documentation

## 2012-12-10 DIAGNOSIS — I10 Essential (primary) hypertension: Secondary | ICD-10-CM | POA: Insufficient documentation

## 2012-12-10 DIAGNOSIS — F10239 Alcohol dependence with withdrawal, unspecified: Principal | ICD-10-CM

## 2012-12-10 DIAGNOSIS — F1020 Alcohol dependence, uncomplicated: Secondary | ICD-10-CM | POA: Diagnosis present

## 2012-12-10 DIAGNOSIS — F10939 Alcohol use, unspecified with withdrawal, unspecified: Principal | ICD-10-CM | POA: Diagnosis present

## 2012-12-10 DIAGNOSIS — Z87828 Personal history of other (healed) physical injury and trauma: Secondary | ICD-10-CM | POA: Insufficient documentation

## 2012-12-10 HISTORY — DX: Depression, unspecified: F32.A

## 2012-12-10 HISTORY — DX: Major depressive disorder, single episode, unspecified: F32.9

## 2012-12-10 LAB — CBC WITH DIFFERENTIAL/PLATELET
Basophils Absolute: 0 10*3/uL (ref 0.0–0.1)
Basophils Relative: 1 % (ref 0–1)
Eosinophils Absolute: 0 10*3/uL (ref 0.0–0.7)
Eosinophils Relative: 1 % (ref 0–5)
HCT: 34.7 % — ABNORMAL LOW (ref 39.0–52.0)
Hemoglobin: 11.9 g/dL — ABNORMAL LOW (ref 13.0–17.0)
Lymphocytes Relative: 41 % (ref 12–46)
Lymphs Abs: 1.8 10*3/uL (ref 0.7–4.0)
MCH: 30.1 pg (ref 26.0–34.0)
MCHC: 34.3 g/dL (ref 30.0–36.0)
MCV: 87.6 fL (ref 78.0–100.0)
Monocytes Absolute: 0.4 10*3/uL (ref 0.1–1.0)
Monocytes Relative: 10 % (ref 3–12)
Neutro Abs: 2.1 10*3/uL (ref 1.7–7.7)
Neutrophils Relative %: 48 % (ref 43–77)
Platelets: 191 10*3/uL (ref 150–400)
RBC: 3.96 MIL/uL — ABNORMAL LOW (ref 4.22–5.81)
RDW: 14.6 % (ref 11.5–15.5)
WBC: 4.4 10*3/uL (ref 4.0–10.5)

## 2012-12-10 LAB — BASIC METABOLIC PANEL
BUN: <3 mg/dL — ABNORMAL LOW (ref 6–23)
CO2: 23 mEq/L (ref 19–32)
Calcium: 8.5 mg/dL (ref 8.4–10.5)
Chloride: 95 mEq/L — ABNORMAL LOW (ref 96–112)
Creatinine, Ser: 0.67 mg/dL (ref 0.50–1.35)
GFR calc Af Amer: >90 mL/min (ref >90)
GFR calc non Af Amer: >90 mL/min (ref >90)
Glucose, Bld: 113 mg/dL — ABNORMAL HIGH (ref 70–99)
Potassium: 3.5 mEq/L (ref 3.5–5.1)
Sodium: 130 mEq/L — ABNORMAL LOW (ref 135–145)

## 2012-12-10 LAB — SALICYLATE LEVEL: Salicylate Lvl: <2 mg/dL — ABNORMAL LOW (ref 2.8–20.0)

## 2012-12-10 LAB — HEPATIC FUNCTION PANEL
ALT: 70 U/L — ABNORMAL HIGH (ref 0–53)
AST: 120 U/L — ABNORMAL HIGH (ref 0–37)
Albumin: 3 g/dL — ABNORMAL LOW (ref 3.5–5.2)
Alkaline Phosphatase: 177 U/L — ABNORMAL HIGH (ref 39–117)
Bilirubin, Direct: 0.2 mg/dL (ref 0.0–0.3)
Indirect Bilirubin: 0.4 mg/dL (ref 0.3–0.9)
Total Bilirubin: 0.6 mg/dL (ref 0.3–1.2)
Total Protein: 7.7 g/dL (ref 6.0–8.3)

## 2012-12-10 LAB — RAPID URINE DRUG SCREEN, HOSP PERFORMED
Amphetamines: NOT DETECTED
Barbiturates: NOT DETECTED
Benzodiazepines: NOT DETECTED
Cocaine: NOT DETECTED
Opiates: NOT DETECTED
Tetrahydrocannabinol: NOT DETECTED

## 2012-12-10 LAB — LIPASE, BLOOD: Lipase: 57 U/L (ref 11–59)

## 2012-12-10 LAB — ACETAMINOPHEN LEVEL: Acetaminophen (Tylenol), Serum: <15 ug/mL (ref 10–30)

## 2012-12-10 LAB — ETHANOL: Alcohol, Ethyl (B): 108 mg/dL — ABNORMAL HIGH (ref 0–11)

## 2012-12-10 MED ORDER — ALUM & MAG HYDROXIDE-SIMETH 200-200-20 MG/5ML PO SUSP: 30.0000 mL | ORAL | Status: DC | PRN

## 2012-12-10 MED ORDER — CHLORDIAZEPOXIDE HCL 25 MG PO CAPS: 25.0000 mg | ORAL_CAPSULE | Freq: Once | ORAL | Status: DC

## 2012-12-10 MED ORDER — ZOLPIDEM TARTRATE 5 MG PO TABS: 5.0000 mg | ORAL_TABLET | Freq: Every evening | ORAL | Status: DC | PRN

## 2012-12-10 MED ORDER — ALBUTEROL SULFATE HFA 108 (90 BASE) MCG/ACT IN AERS: 2.0000 | INHALATION_SPRAY | Freq: Four times a day (QID) | RESPIRATORY_TRACT | Status: DC | PRN

## 2012-12-10 MED ORDER — TRAZODONE HCL 50 MG PO TABS
50.0000 mg | ORAL_TABLET | Freq: Every evening | ORAL | Status: DC | PRN
  Administered 2012-12-11 – 2012-12-16 (×8): 50 mg via ORAL
  Filled 2012-12-10 (×5): qty 1
  Filled 2012-12-10: qty 28
  Filled 2012-12-10 (×2): qty 1
  Filled 2012-12-10: qty 28
  Filled 2012-12-10 (×10): qty 1

## 2012-12-10 MED ORDER — HYDROXYZINE HCL 25 MG PO TABS: 25.0000 mg | ORAL_TABLET | Freq: Four times a day (QID) | ORAL | Status: AC | PRN

## 2012-12-10 MED ORDER — CHLORDIAZEPOXIDE HCL 25 MG PO CAPS: 25.0000 mg | ORAL_CAPSULE | Freq: Four times a day (QID) | ORAL | Status: AC | PRN

## 2012-12-10 MED ORDER — CHLORDIAZEPOXIDE HCL 25 MG PO CAPS
25.0000 mg | ORAL_CAPSULE | Freq: Four times a day (QID) | ORAL | Status: AC
  Administered 2012-12-10 – 2012-12-12 (×6): 25 mg via ORAL
  Filled 2012-12-10 (×6): qty 1

## 2012-12-10 MED ORDER — CHLORDIAZEPOXIDE HCL 25 MG PO CAPS
25.0000 mg | ORAL_CAPSULE | Freq: Every day | ORAL | Status: AC
  Administered 2012-12-15: 25 mg via ORAL
  Filled 2012-12-10: qty 1

## 2012-12-10 MED ORDER — ONDANSETRON HCL 4 MG/2ML IJ SOLN
4.0000 mg | Freq: Once | INTRAMUSCULAR | Status: DC
  Filled 2012-12-10: qty 2

## 2012-12-10 MED ORDER — CLONIDINE HCL 0.1 MG/24HR TD PTWK
0.1000 mg | MEDICATED_PATCH | TRANSDERMAL | Status: DC
  Administered 2012-12-11: 0.1 mg via TRANSDERMAL
  Filled 2012-12-10 (×3): qty 1

## 2012-12-10 MED ORDER — CHLORDIAZEPOXIDE HCL 25 MG PO CAPS
25.0000 mg | ORAL_CAPSULE | Freq: Three times a day (TID) | ORAL | Status: AC
  Administered 2012-12-12 – 2012-12-13 (×3): 25 mg via ORAL
  Filled 2012-12-10 (×3): qty 1

## 2012-12-10 MED ORDER — VITAMIN B-1 100 MG PO TABS
100.0000 mg | ORAL_TABLET | Freq: Every day | ORAL | Status: DC
  Administered 2012-12-11 – 2012-12-17 (×7): 100 mg via ORAL
  Filled 2012-12-10 (×9): qty 1

## 2012-12-10 MED ORDER — SODIUM CHLORIDE 0.9 % IV BOLUS (SEPSIS)
1000.0000 mL | Freq: Once | INTRAVENOUS | Status: AC
  Administered 2012-12-10: 1000 mL via INTRAVENOUS

## 2012-12-10 MED ORDER — LORAZEPAM 1 MG PO TABS: 1.0000 mg | ORAL_TABLET | Freq: Three times a day (TID) | ORAL | Status: DC | PRN

## 2012-12-10 MED ORDER — CHLORDIAZEPOXIDE HCL 25 MG PO CAPS
25.0000 mg | ORAL_CAPSULE | ORAL | Status: AC
  Administered 2012-12-13 – 2012-12-14 (×2): 25 mg via ORAL
  Filled 2012-12-10 (×2): qty 1

## 2012-12-10 MED ORDER — ACETAMINOPHEN 325 MG PO TABS
650.0000 mg | ORAL_TABLET | ORAL | Status: DC | PRN
  Administered 2012-12-10: 650 mg via ORAL
  Filled 2012-12-10: qty 2

## 2012-12-10 MED ORDER — ONDANSETRON HCL 4 MG PO TABS: 4.0000 mg | ORAL_TABLET | Freq: Three times a day (TID) | ORAL | Status: DC | PRN

## 2012-12-10 MED ORDER — MAGNESIUM HYDROXIDE 400 MG/5ML PO SUSP: 30.0000 mL | Freq: Every day | ORAL | Status: DC | PRN

## 2012-12-10 MED ORDER — ACETAMINOPHEN 325 MG PO TABS
650.0000 mg | ORAL_TABLET | Freq: Four times a day (QID) | ORAL | Status: DC | PRN
  Administered 2012-12-10 – 2012-12-16 (×4): 650 mg via ORAL

## 2012-12-10 MED ORDER — ONDANSETRON 4 MG PO TBDP
4.0000 mg | ORAL_TABLET | Freq: Four times a day (QID) | ORAL | Status: AC | PRN
  Administered 2012-12-11: 4 mg via ORAL
  Filled 2012-12-10: qty 1

## 2012-12-10 MED ORDER — IBUPROFEN 600 MG PO TABS: 600.0000 mg | ORAL_TABLET | Freq: Three times a day (TID) | ORAL | Status: DC | PRN

## 2012-12-10 MED ORDER — THIAMINE HCL 100 MG/ML IJ SOLN: 100.0000 mg | Freq: Once | INTRAMUSCULAR | Status: DC

## 2012-12-10 MED ORDER — ADULT MULTIVITAMIN W/MINERALS CH
1.0000 | ORAL_TABLET | Freq: Every day | ORAL | Status: DC
  Administered 2012-12-11 – 2012-12-17 (×7): 1 via ORAL
  Filled 2012-12-10 (×9): qty 1

## 2012-12-10 MED ORDER — NICOTINE 14 MG/24HR TD PT24
14.0000 mg | MEDICATED_PATCH | Freq: Every day | TRANSDERMAL | Status: DC
  Filled 2012-12-10 (×3): qty 1

## 2012-12-10 MED ORDER — LOPERAMIDE HCL 2 MG PO CAPS
2.0000 mg | ORAL_CAPSULE | ORAL | Status: AC | PRN
  Administered 2012-12-11: 4 mg via ORAL
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
## 2012-12-10 NOTE — ED Notes (Signed)
Patient resting in bed; no s/s of distress noted. Pt denies SI/HI

## 2012-12-10 NOTE — ED Notes (Signed)
Reported history of drinking daily, essentially til the stores close. States he has been drinking since teens and has had a couple attempts at detoxing. Was most recently at [facility] ~1 yr ago. States he was sober for about 1 week and relapsed. Presently voluntarily for treatment.

## 2012-12-10 NOTE — ED Notes (Signed)
Pt transported to [facility] via security; no s/s of distress noted at this time.
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
## 2012-12-10 NOTE — ED Notes (Signed)
Suitcase placed in activity room

## 2012-12-10 NOTE — ED Notes (Signed)
Pt reports N/V/D x 1 month, states he thinks he's lost about 10 pounds over the past month.  Pt also states he also drinks beer all day and would like to stop drinking.

## 2012-12-10 NOTE — BHH Counselor (Signed)
Pt has been accepted to [facility] bed 302-1, Armanda Heritage to Cornish MD. EDP and RN notified. Support paperwork signed and faxed to [facility] and originals placed in chart.   Evette Cristal, [place] Assessment Counselor

## 2012-12-11 ENCOUNTER — Encounter (HOSPITAL_COMMUNITY): Payer: Self-pay | Admitting: Psychiatry

## 2012-12-11 DIAGNOSIS — F10939 Alcohol use, unspecified with withdrawal, unspecified: Principal | ICD-10-CM | POA: Diagnosis present

## 2012-12-11 DIAGNOSIS — F1994 Other psychoactive substance use, unspecified with psychoactive substance-induced mood disorder: Secondary | ICD-10-CM

## 2012-12-11 DIAGNOSIS — F1020 Alcohol dependence, uncomplicated: Secondary | ICD-10-CM

## 2012-12-11 DIAGNOSIS — F10239 Alcohol dependence with withdrawal, unspecified: Principal | ICD-10-CM

## 2012-12-11 NOTE — Progress Notes (Signed)
Adult Psychoeducational Group Note  Date:  12/11/2012 Time:  10:59 AM  Group Topic/Focus:  Recovery Goals:   The focus of this group is to identify appropriate goals for recovery and establish a plan to achieve them.  Participation Level:  Minimal  Participation Quality:  Resistant  Affect:  Appropriate  Cognitive:  Appropriate  Insight: Lacking  Engagement in Group:  Resistant  Modes of Intervention:  Education  Additional Comments:  Pt was limited in his interactions and stated that he was having some stomach pain. Pt also stated that women and money is what stands between him and recovery.   Sharyn Lull 12/11/2012, 10:59 AM

## 2012-12-11 NOTE — BHH Suicide Risk Assessment (Signed)
Suicide Risk Assessment  Admission Assessment     Nursing information obtained from:  Patient Demographic factors:  Low socioeconomic status;Living alone;Unemployed Current Mental Status:  NA (denies SI) Loss Factors:  Financial problems / change in socioeconomic status Historical Factors:  Family history of mental illness or substance abuse Risk Reduction Factors:  Religious beliefs about death;Positive social support  CLINICAL FACTORS:   Depression:   Comorbid alcohol abuse/dependence Alcohol/Substance Abuse/Dependencies  COGNITIVE FEATURES THAT CONTRIBUTE TO RISK: None identified   SUICIDE RISK:   Mild:  Suicidal ideation of limited frequency, intensity, duration, and specificity.  There are no identifiable plans, no associated intent, mild dysphoria and related symptoms, good self-control (both objective and subjective assessment), few other risk factors, and identifiable protective factors, including available and accessible social support.  PLAN OF CARE: Supportive approach/coping skills/relapse prevention                              [facility] detox protol  I certify that inpatient services furnished can reasonably be expected to improve the patient's condition.  Rivkah Wolz A 12/11/2012, 8:53 AM
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
## 2012-12-11 NOTE — Tx Team (Signed)
Initial Interdisciplinary Treatment Plan  PATIENT STRENGTHS: (choose at least two) Average or above average intelligence Capable of independent living General fund of knowledge Motivation for treatment/growth Supportive family/friends  PATIENT STRESSORS: Financial difficulties Medication change or noncompliance Substance abuse   PROBLEM LIST: Problem List/Patient Goals Date to be addressed Date deferred Reason deferred Estimated date of resolution  Substance abuse-"I need to stop drinking"        anxiety      Financial difficulties related to his drinking                                           DISCHARGE CRITERIA:  Ability to meet basic life and health needs Improved stabilization in mood, thinking, and/or behavior Motivation to continue treatment in a less acute level of care Verbal commitment to aftercare and medication compliance Withdrawal symptoms are absent or subacute and managed without 24-hour nursing intervention  PRELIMINARY DISCHARGE PLAN: Attend aftercare/continuing care group Attend 12-step recovery group Return to previous living arrangement  PATIENT/FAMIILY INVOLVEMENT: This treatment plan has been presented to and reviewed with the patient, Chad Turner, and/or family member.  The patient and family have been given the opportunity to ask questions and make suggestions.  Jesus Genera [facility] 12/11/2012, 12:52 AM

## 2012-12-11 NOTE — Progress Notes (Signed)
[facility] LCSW Aftercare Discharge Planning Group Note  12/11/2012 8:45 AM  Participation Quality:  Did Not Attend   [name] 12/11/2012, 11:07 AM

## 2012-12-11 NOTE — Progress Notes (Addendum)
Patient ID: Chad Turner, male   DOB: [birth year], 52 y.o.   MRN: [id number] He has been up and to groups today interacting with peers and staff. Self inventory; Depressed 3, hopelessness 3., w/d's craving, denies SI. He has been pleasant and cooperative. He did request and receive PRN this AM for diarrhea and nausea that  was effective.

## 2012-12-11 NOTE — ED Provider Notes (Signed)
Medical screening examination/treatment/procedure(s) were conducted as a shared visit with non-physician practitioner(s) and myself.  I personally evaluated the patient during the encounter.  Long-standing alcohol abuse. Will consult behavioral health for possible detox  Donnetta Hutching, MD 12/11/12 [phone]

## 2012-12-11 NOTE — Progress Notes (Signed)
[facility] Group Notes:  (Counselor/Nursing/MHT/Case Management/Adjunct)  Type of Therapy:  Psychoeducational Skills  Participation Level:  Minimal  Participation Quality:  Appropriate, Attentive, Sharing and Supportive  Affect:  Blunted  Cognitive:  Appropriate and Oriented  Insight:  Good  Engagement in Group:  Engaged  Modes of Intervention:  Activity, Discussion, Education, Problem-solving, Rapport Building, Socialization and Support  Summary of Progress/Problems: Chad Turner attended psychoeducational group that focused on using quality time with support systems/individuals to engage in health coping skills. Chad Turner participated in activity guessing about self and peers. Chad Turner was quiet but attentive while group discussed who their support systems are, how they can spend positive quality time with them as a coping skills and a way to strengthen their relationship. Chad Turner stated his support system included people who enable him in his unhealthy behaviors. Chad Turner was given a homework assignment to find two ways to improve his support systems and twenty activities he can do to spend quality time with his supports.   [name] 12/11/2012 4:24 PM

## 2012-12-11 NOTE — Progress Notes (Signed)
Recreation Therapy Notes   12/11/2012  Time: 3:00pm   Group Topic/Focus: Goal Workshop   Participation Level:  Active   Participation Quality:  Appropriate   Affect:  Euthymic   Cognitive:  Appropriate   Additional Comments: Patient created Recovery Goal Chart, chart contained 3 goals he wants to work on and any obstacles he might encounter. Patient shared goals of going to meeting and spending time with his grandbaby with group. Patient discussed his sponsor being a good support system for him. Patient stated his sponsor was going to come to see him soon. Patient interacted appropriately with peers during session. Patient offered feedback to peers during session.    Marykay Lex Rael Tilly, LRT/CTRS     Jearl Klinefelter 12/11/2012 4:07 PM

## 2012-12-11 NOTE — Progress Notes (Signed)
Vol admit to the 300 hall requesting detox from alcohol.  Pt reports he drinks about 10-12 qts of alcohol daily.  He says he drinks from the time he gets up until he goes to bed each day.  He has been to [facility] in the last year for detox.  He says his longest period of sobriety was 8 months.  He reports that he has had a decrease in appetite because he drinks all the time.  He denies using any other substances.  He reports a hx asthma and HTN.  He said he was taking medication for his BP, but stopped when [facility] closed.  He was hypertensive on admission and this was reported to the PA.  Pt was pleasant/cooperative with the admission process.  Pt was offered a meal which he declined.  Oriented to the unit/room by the MHT.  Safety maintained with q15 minute checks.

## 2012-12-12 MED ORDER — CYANOCOBALAMIN 1000 MCG/ML IJ SOLN
1000.0000 ug | Freq: Once | INTRAMUSCULAR | Status: AC
  Administered 2012-12-12: 1000 ug via INTRAMUSCULAR
  Filled 2012-12-12 (×2): qty 1

## 2012-12-12 MED ORDER — ENSURE COMPLETE PO LIQD
237.0000 mL | Freq: Two times a day (BID) | ORAL | Status: DC
  Administered 2012-12-12 – 2012-12-17 (×7): 237 mL via ORAL

## 2012-12-12 NOTE — Progress Notes (Signed)
Adult Psychoeducational Group Note  Date:  12/12/2012 Time:  1:54 PM  Group Topic/Focus:  Personal Choices and Values:   The focus of this group is to help patients assess and explore the importance of values in their lives, how their values affect their decisions, how they express their values and what opposes their expression.  Participation Level:  Active  Participation Quality:  Appropriate and Attentive  Affect:  Appropriate  Cognitive:  Alert and Appropriate  Insight: Appropriate  Engagement in Group:  Engaged  Modes of Intervention:  Discussion  Additional Comments:  Pt was appropriate and attentive while attending group. Pt stated that he hates that he stays by himself. He feels that being bored cause him to drink so he plans to attend more AA or NA groups throughout the week.   Sharyn Lull 12/12/2012, 1:54 PM

## 2012-12-12 NOTE — Progress Notes (Signed)
D:  Akshar reports that he slept ok and that his appetite is improving.  He states that his energy level is low and that his ability to pay attention is improving.  He reports depression at 5/10 and hopelessness at 5/10.  He denies SI/HI/AVH at this time.  He reports cravings and a headache as withdrawal symptoms.  He is going to groups and is interacting appropriately with staff and other patients.   A:  Safety checks q 15 minutes.  Emotional support provided.  Medications given as ordered.   R:  Safety maintained on unit.

## 2012-12-12 NOTE — Progress Notes (Signed)
[facility] MD Progress Note  12/12/2012 3:45 PM LAM MCCUBBINS  MRN:  [id number] Subjective:  Kenshawn endorses lack of energy, motivation, tiredness. He was not eating but only drinking. He is concerned and worried about how he got to that point. He was drinking from early AM till he went to bed. He states that he cant go to rehab. Once detox he wants to go home and work on outpatient basis Diagnosis:  Alcohol Dependence/withdrawal/cocaine abuse/substance induced mood disorder  ADL's:  Intact  Sleep: Fair  Appetite:  Poor  Suicidal Ideation:  Plan:  denies Intent:  denies Means:  denies Homicidal Ideation:  Plan:  denies Intent:  denies Means:  denies AEB (as evidenced by):  Psychiatric Specialty Exam: Review of Systems  Constitutional: Positive for weight loss and malaise/fatigue.  HENT: Negative.   Eyes: Negative.   Respiratory: Negative.   Cardiovascular: Negative.   Gastrointestinal: Negative.   Genitourinary: Negative.   Musculoskeletal: Positive for myalgias, back pain and joint pain.  Skin: Negative.   Neurological: Positive for weakness.  Endo/Heme/Allergies: Negative.   Psychiatric/Behavioral: Positive for depression and substance abuse. The patient is nervous/anxious.     Blood pressure 131/91, pulse 90, temperature 97.4 F (36.3 C), temperature source Oral, resp. rate 18, height 5' 6.93" (1.7 m), weight 77.565 kg (171 lb).Body mass index is 26.84 kg/(m^2).  General Appearance: Disheveled  Eye Solicitor::  Fair  Speech:  Clear and Coherent, Slow and not spontaneous  Volume:  Decreased  Mood:  Depressed and tired  Affect:  Restricted  Thought Process:  Coherent and Goal Directed  Orientation:  Full (Time, Place, and Person)  Thought Content:  worries, concerns  Suicidal Thoughts:  No  Homicidal Thoughts:  No  Memory:  Immediate;   Fair Recent;   Fair Remote;   Fair  Judgement:  Fair  Insight:  Present  Psychomotor Activity:  Decreased  Concentration:  Fair   Recall:  Fair  Akathisia:  No  Handed:  Right  AIMS (if indicated):     Assets:  Desire for Improvement  Sleep:  Number of Hours: 5.75   Current Medications: Current Facility-Administered Medications  Medication Dose Route Frequency Provider Last Rate Last Dose  . acetaminophen (TYLENOL) tablet 650 mg  650 mg Oral Q6H PRN Kerry Hough, PA   650 mg at 12/12/12 0820  . albuterol (PROVENTIL HFA;VENTOLIN HFA) 108 (90 BASE) MCG/ACT inhaler 2 puff  2 puff Inhalation Q6H PRN Kerry Hough, PA      . alum & mag hydroxide-simeth (MAALOX/MYLANTA) 200-200-20 MG/5ML suspension 30 mL  30 mL Oral Q4H PRN Kerry Hough, PA      . chlordiazePOXIDE (LIBRIUM) capsule 25 mg  25 mg Oral Q6H PRN Kerry Hough, PA      . chlordiazePOXIDE (LIBRIUM) capsule 25 mg  25 mg Oral TID Kerry Hough, PA   25 mg at 12/12/12 1201   Followed by  . [START ON 12/13/2012] chlordiazePOXIDE (LIBRIUM) capsule 25 mg  25 mg Oral BH-qamhs Kerry Hough, PA       Followed by  . [START ON 12/15/2012] chlordiazePOXIDE (LIBRIUM) capsule 25 mg  25 mg Oral Daily Kerry Hough, PA      . cloNIDine (CATAPRES - Dosed in mg/24 hr) patch 0.1 mg  0.1 mg Transdermal Weekly Kerry Hough, PA   0.1 mg at 12/11/12 0008  . cyanocobalamin ((VITAMIN B-12)) injection 1,000 mcg  1,000 mcg Intramuscular Once Rachael Fee, MD      .  feeding supplement (ENSURE COMPLETE) liquid 237 mL  237 mL Oral BID BM Jeoffrey Massed, RD   237 mL at 12/12/12 1435  . hydrOXYzine (ATARAX/VISTARIL) tablet 25 mg  25 mg Oral Q6H PRN Kerry Hough, PA      . loperamide (IMODIUM) capsule 2-4 mg  2-4 mg Oral PRN Kerry Hough, PA   4 mg at 12/11/12 0826  . magnesium hydroxide (MILK OF MAGNESIA) suspension 30 mL  30 mL Oral Daily PRN Kerry Hough, PA      . multivitamin with minerals tablet 1 tablet  1 tablet Oral Daily Kerry Hough, PA   1 tablet at 12/12/12 0809  . ondansetron (ZOFRAN-ODT) disintegrating tablet 4 mg  4 mg Oral Q6H PRN Kerry Hough,  PA   4 mg at 12/11/12 0827  . thiamine (B-1) injection 100 mg  100 mg Intramuscular Once Kerry Hough, PA      . thiamine (VITAMIN B-1) tablet 100 mg  100 mg Oral Daily Kerry Hough, PA   100 mg at 12/12/12 0809  . traZODone (DESYREL) tablet 50 mg  50 mg Oral QHS,MR X 1 Kerry Hough, PA   50 mg at 12/11/12 2246    Lab Results: No results found for this or any previous visit (from the past 48 hour(s)).  Physical Findings: AIMS: Facial and Oral Movements Muscles of Facial Expression: None, normal Lips and Perioral Area: None, normal Jaw: None, normal Tongue: None, normal,Extremity Movements Upper (arms, wrists, hands, fingers): None, normal Lower (legs, knees, ankles, toes): None, normal, Trunk Movements Neck, shoulders, hips: None, normal, Overall Severity Severity of abnormal movements (highest score from questions above): None, normal Incapacitation due to abnormal movements: None, normal Patient's awareness of abnormal movements (rate only patient's report): No Awareness, Dental Status Current problems with teeth and/or dentures?: No Does patient usually wear dentures?: No  CIWA:  CIWA-Ar Total: 1 COWS:     Treatment Plan Summary: Daily contact with patient to assess and evaluate symptoms and progress in treatment Medication management  Plan: Supportive approach/coping skills/relapse prevention           Identify and address the co morbidities           Address the nutritional status  Medical Decision Making Problem Points:  Review of psycho-social stressors (1) Data Points:  Review of medication regiment & side effects (2)  I certify that inpatient services furnished can reasonably be expected to improve the patient's condition.   Ranya Fiddler A 12/12/2012, 3:45 PM

## 2012-12-12 NOTE — Progress Notes (Signed)
Nutrition Brief Note  Patient identified on the Malnutrition Screening Tool (MST) Report  Body mass index is 26.84 kg/(m^2). Patient meets criteria for overweight based on current BMI.   Patient reports UBW of 200-210 lbs with a 30-40# recent weight loss due to ETOH use N/V. Weight currently 171 lbs.   Now tolerating meals here well.  No N/V.  Receptive to Ensure.  Current diet order is regular, patient is consuming approximately 100% of meals at this time. Labs and medications reviewed.   Will order Ensure Complete po BID, each supplement provides 350 kcal and 13 grams of protein. If further nutrition issues arise, please consult RD.   Oran Rein, RD, LDN Clinical Inpatient Dietitian Pager:  [phone] Weekend and after hours pager:  [phone]

## 2012-12-12 NOTE — Progress Notes (Signed)
[facility] LCSW Group Therapy  12/12/2012   Type of Therapy:  Group at 1:15  Participation Level:  Appropriate  Participation Quality: Attentive and sharing  Affect:  Depressed   Cognitive: Alert and oriented   Insight:  Improving  Engagement in Therapy: Engaged  Modes of Intervention:  Discussion Clarification Exploration Support   Summary of Progress/Problems:  Patient shared from several experiences how he has difficulty expressing emotions and often times uses substances to numb his emotions. [place] also offered support to others  [name] 12/12/2012 6:56 PM

## 2012-12-12 NOTE — Progress Notes (Signed)
Patient ID: Chad Turner, male   DOB: Aug 30, 1961, 52 y.o.   MRN: [id number]  D: Patient with dull, flat affect endorsing depression but is pleasant with staff. A: Monitor patient Q 15 minutes for safety, encourage staff/peer interaction, administer medications as ordered. R: Pt denies SI or plans to harm himself.

## 2012-12-12 NOTE — Progress Notes (Signed)
[facility] LCSW Aftercare Discharge Planning Group Note  12/12/2012 8:45 AM  Participation Quality: Appropriate  Affect: Appropriate  Cognitive:  Alert and Oriented  Insight:  Limited  Engagement in Group:  Engaged  Modes of Intervention:  Exploration, Clarification, Rapport Building and Support  Summary of Progress/Problems:  Patient shared that  He has been using alcohol and trying to keep it a secret from family and sponsor.  Patient has his own apartment and agreed to contact his sponsor today to let him know of relapse.  [name] 12/12/2012, 8:45 AM

## 2012-12-12 NOTE — Tx Team (Signed)
Interdisciplinary Treatment Plan Update (Adult)  Date: 12/12/2012  Time Reviewed: 10:13 AM   Progress in Treatment: Attending groups: Yes Participating in groups: Yes Taking medication as prescribed:  Yes Tolerating medication:  Yes Family/Significant othe contact made: Not as yet Patient understands diagnosis: Yes Discussing patient identified problems/goals with staff: Yes Medical problems stabilized or resolved:  Yes Denies suicidal/homicidal ideation: Yes Issues/concerns per patient self-inventory:  None noted Other: N/A  New problem(s) identified: None Identified  Reason for Continuation of Hospitalization: Withdrawal symptoms  Interventions implemented related to continuation of hospitalization: mood stabilization, medication monitoring and adjustment, group therapy and psycho education, suicide risk assessment, collateral contact, aftercare planning, ongoing physician assessments and safety checks q 15 mins  Additional comments: N/A  Estimated length of stay: 5-7 days  Discharge Plan:  CSW is assessing for appropriate referrals. Explore [facility] or other long term treatment as patient has been here multiple times for detox  New goal(s): N/A  Review of initial/current patient goals per problem list:     1.  Goal: Patient will be able to identify effective and ineffective coping patterns  Met: No  Target Date: Discharge   As evidenced by: Patient participation in groups and staff observations 2. Complete Detox Protocol and Identify comprehensive mental wellness and sobriety plan  Met: No  Target date: DC As evidenced RU:EAVW report, and with support of CSW knowing follow up plan    Attendees: Patient:     Family:     Physician:  Geoffery Lyons 12/12/2012 10:13 AM   Nursing:   Robbie Louis, RN 12/12/2012 10:13 AM   Clinical Social Worker Ronda Fairly 12/12/2012 10:13 AM   Other:  Chinita Greenland, RN 12/12/2012 10:13 AM   Other:  Olivia Mackie, Psych Intern  12/12/2012 10:13 AM   Other:   12/12/2012 10:13 AM   Other:   12/12/2012 10:13 AM    Scribe for Treatment Team:   Carney Bern, LCSWA  12/12/2012 10:13 AM

## 2012-12-12 NOTE — Progress Notes (Signed)
[facility] Group Notes:  (Nursing/MHT/Case Management/Adjunct)  Date:  12/12/2012  Time:  5:31 AM  Type of Therapy:  AA  Participation Level:  Active  Participation Quality:  Appropriate  Affect:  Appropriate  Cognitive:  Appropriate  Insight:  Appropriate  Engagement in Group:  Engaged  Modes of Intervention:  Discussion, Education and Support  Summary of Progress/Problems: Patient stayed the entire group.   Chad Turner 12/12/2012, 5:31 AM

## 2012-12-13 MED ORDER — FERROUS SULFATE 325 (65 FE) MG PO TABS
325.0000 mg | ORAL_TABLET | Freq: Two times a day (BID) | ORAL | Status: DC
  Administered 2012-12-13 – 2012-12-17 (×8): 325 mg via ORAL
  Filled 2012-12-13 (×5): qty 1
  Filled 2012-12-13: qty 28
  Filled 2012-12-13 (×3): qty 1
  Filled 2012-12-13: qty 28
  Filled 2012-12-13 (×3): qty 1

## 2012-12-13 NOTE — Progress Notes (Signed)
D-Patient is out in milieu interacting with peers and attending groups.  Rates depression and hopelessness at 5. C/O cravings r/t withdrawal. Reports poor sleep,improving appetite and energy level.  Denies SI. D/C plan is "to go to meetings".  A- Support and encouragement given. Continue current POC and evaluation of treatment goals.  Continue 15' checks for safety

## 2012-12-13 NOTE — Progress Notes (Signed)
[facility] LCSW Group Therapy  12/13/2012   Type of Therapy:  Group Therapy at 1:15  Participation Level:  Active  Participation Quality:  Appropriate  Affect:  Appropriate  Cognitive:   Appropriate  Insight:  Developing/Improving  Engagement in Therapy:  Engaged  Modes of Intervention:  Clarification, Discussion, Exploration, Problem-solving and Reality Testing  Summary of Progress/Problems: Patients were able to process what a life of balance might look like.  Chad Turner was able to process that he still has some imbalance in his life specifically in the form of an intimate relationship and sees that in order to stay away from alcohol he must spend his time with people who do not use.   Chad Turner 12/13/2012, 3:26 PM

## 2012-12-13 NOTE — Progress Notes (Signed)
[facility] Group Notes:  (Nursing/MHT/Case Management/Adjunct)  Date:  12/13/2012  Time:  2:26 AM  Type of Therapy:  Wrap up group  Participation Level:  Did Not Attend  Participation Quality:  Did not attend  Affect:  Did not attend  Cognitive:  Did not attend  Insight:  None  Engagement in Group:  Did not attend  Modes of Intervention:  N/A  Summary of Progress/Problems: Patient remained in his room asleep during group time.  Tomi Bamberger Coursey 12/13/2012, 2:26 AM

## 2012-12-13 NOTE — Progress Notes (Signed)
Pt did go down to the gym for recreation time.  The pt was appropriate but was not active.  Pt observed others activities and enjoyed being off the hall.

## 2012-12-13 NOTE — Progress Notes (Signed)
Adult Psychosocial Assessment Update Interdisciplinary Team  Previous [facility] admissions/discharges:  Admissions Discharges  Date:07/06/12 Date: 07/11/12  Date:12/31/11 Date: 01/17/12  Date: Date:  Date: Date:  Date: Date:   Changes since the last Psychosocial Assessment (including adherence to outpatient mental health and/or substance abuse treatment, situational issues contributing to decompensation and/or relapse).  Patient returned home and got involved with AA and NA meetings and working with a   Marketing executive since mid September.  Patient has been avoiding sponsor since early Dec-  cember and also quit attending 12 step meetings after he began using alcohol again.Patient has remained free of Crack cocaine and has not received any recent   Charges. Meet has experienced severe physical sickness of late and believes it  Is due to his drinking.     Discharge Plan 1. Will you be returning to the same living situation after discharge?   Yes: X No:      If no, what is your plan?           2. Would you like a referral for services when you are discharged? Yes:  X   If yes, for what services?  No:       Patient will need followyup at [facility] [place] and is willing to get reinvolved with 12 step  Programs.  Patient reports he called sponsor last night and let him know what had  Happened.  Sponsor will visit for dinner   Summary and Recommendations (to be completed by the evaluator) Patient is 52 YO unemployeed [place] American male who has been with [place] before and admitted with diagnosis of Alcohol Dependence.  Patient has some health concerns  That appear to be an additional motivator for patient to abstain from alcohol ues.  Patient would benefit from crisis stabilization, medication evaluation, therapy groups for processing thoughts/feelings/experiences, psycho ed groups for coping skills, and case management for discharge planning.                   Signature:   [name], 12/13/2012 6:33 PM

## 2012-12-13 NOTE — Progress Notes (Signed)
[facility] MD Progress Note  12/13/2012 3:37 PM Chad Turner  MRN:  [id number] Subjective:  Chad Turner does not want to go to a rehab program. He would like to have outpatient follow up. His low hemoglobin decreasing in the last 7-8 months, plus the finding of some growth in his liver will need follow up. He is aware of this finding and the need to abstain from drinking. He states he is committed to do it Diagnosis:  Alcohol Dependence/withdrawal, cocaine abuse  ADL's:  Intact  Sleep: Fair  Appetite:  getting better  Suicidal Ideation:  Plan:  denies Intent:  denies Means:  denies Homicidal Ideation:  Plan:  denies Intent:  denies Means:  Denies AEB (as evidenced by):  Psychiatric Specialty Exam: Review of Systems  Constitutional: Positive for malaise/fatigue.  HENT: Negative.   Eyes: Negative.   Respiratory: Negative.   Cardiovascular: Negative.   Gastrointestinal: Negative.   Genitourinary: Negative.   Musculoskeletal: Positive for back pain.  Skin: Negative.   Neurological: Positive for weakness.  Endo/Heme/Allergies: Negative.   Psychiatric/Behavioral: Positive for depression and substance abuse. The patient is nervous/anxious.     Blood pressure 103/71, pulse 93, temperature 98.2 F (36.8 C), temperature source Oral, resp. rate 16, height 5' 6.93" (1.7 m), weight 77.565 kg (171 lb).Body mass index is 26.84 kg/(m^2).  General Appearance: Fairly Groomed  Patent attorney::  Fair  Speech:  Clear and Coherent, Slow and not spontaneous  Volume:  Decreased  Mood:  Depressed  Affect:  Restricted  Thought Process:  Coherent and Goal Directed  Orientation:  Full (Time, Place, and Person)  Thought Content:  WDL and worries and concerns  Suicidal Thoughts:  No  Homicidal Thoughts:  No  Memory:  Immediate;   Fair Recent;   Fair Remote;   Fair  Judgement:  Fair  Insight:  Present  Psychomotor Activity:  Decreased  Concentration:  Fair  Recall:  Fair  Akathisia:  No  Handed:   Right  AIMS (if indicated):     Assets:  Desire for Improvement  Sleep:  Number of Hours: 5.75   Current Medications: Current Facility-Administered Medications  Medication Dose Route Frequency Provider Last Rate Last Dose  . acetaminophen (TYLENOL) tablet 650 mg  650 mg Oral Q6H PRN Kerry Hough, PA   650 mg at 12/12/12 0820  . albuterol (PROVENTIL HFA;VENTOLIN HFA) 108 (90 BASE) MCG/ACT inhaler 2 puff  2 puff Inhalation Q6H PRN Kerry Hough, PA      . alum & mag hydroxide-simeth (MAALOX/MYLANTA) 200-200-20 MG/5ML suspension 30 mL  30 mL Oral Q4H PRN Kerry Hough, PA      . chlordiazePOXIDE (LIBRIUM) capsule 25 mg  25 mg Oral Q6H PRN Kerry Hough, PA      . chlordiazePOXIDE (LIBRIUM) capsule 25 mg  25 mg Oral BH-qamhs Kerry Hough, PA       Followed by  . [START ON 12/15/2012] chlordiazePOXIDE (LIBRIUM) capsule 25 mg  25 mg Oral Daily Kerry Hough, PA      . cloNIDine (CATAPRES - Dosed in mg/24 hr) patch 0.1 mg  0.1 mg Transdermal Weekly Kerry Hough, PA   0.1 mg at 12/11/12 0008  . feeding supplement (ENSURE COMPLETE) liquid 237 mL  237 mL Oral BID BM Jeoffrey Massed, RD   237 mL at 12/13/12 1000  . ferrous sulfate tablet 325 mg  325 mg Oral BID WC Sanjuana Kava, NP      . hydrOXYzine (ATARAX/VISTARIL)  tablet 25 mg  25 mg Oral Q6H PRN Kerry Hough, PA      . loperamide (IMODIUM) capsule 2-4 mg  2-4 mg Oral PRN Kerry Hough, PA   4 mg at 12/11/12 0826  . magnesium hydroxide (MILK OF MAGNESIA) suspension 30 mL  30 mL Oral Daily PRN Kerry Hough, PA      . multivitamin with minerals tablet 1 tablet  1 tablet Oral Daily Kerry Hough, PA   1 tablet at 12/13/12 0830  . ondansetron (ZOFRAN-ODT) disintegrating tablet 4 mg  4 mg Oral Q6H PRN Kerry Hough, PA   4 mg at 12/11/12 0827  . thiamine (B-1) injection 100 mg  100 mg Intramuscular Once Kerry Hough, PA      . thiamine (VITAMIN B-1) tablet 100 mg  100 mg Oral Daily Kerry Hough, PA   100 mg at 12/13/12  0830  . traZODone (DESYREL) tablet 50 mg  50 mg Oral QHS,MR X 1 Kerry Hough, PA   50 mg at 12/12/12 2131    Lab Results: No results found for this or any previous visit (from the past 48 hour(s)).  Physical Findings: AIMS: Facial and Oral Movements Muscles of Facial Expression: None, normal Lips and Perioral Area: None, normal Jaw: None, normal Tongue: None, normal,Extremity Movements Upper (arms, wrists, hands, fingers): None, normal Lower (legs, knees, ankles, toes): None, normal, Trunk Movements Neck, shoulders, hips: None, normal, Overall Severity Severity of abnormal movements (highest score from questions above): None, normal Incapacitation due to abnormal movements: None, normal Patient's awareness of abnormal movements (rate only patient's report): No Awareness, Dental Status Current problems with teeth and/or dentures?: No Does patient usually wear dentures?: No  CIWA:  CIWA-Ar Total: 4 COWS:     Treatment Plan Summary: Daily contact with patient to assess and evaluate symptoms and progress in treatment Medication management  Plan: Supportive approach/coping skills/relapse prevention           Follow up on medical findings  Medical Decision Making Problem Points:  Review of last therapy session (1) and Review of psycho-social stressors (1) Data Points:  Review of medication regiment & side effects (2)  I certify that inpatient services furnished can reasonably be expected to improve the patient's condition.   Cejay Cambre A 12/13/2012, 3:37 PM

## 2012-12-14 DIAGNOSIS — F1410 Cocaine abuse, uncomplicated: Secondary | ICD-10-CM

## 2012-12-14 NOTE — Progress Notes (Signed)
Adult Psychoeducational Group Note  Date:  12/14/2012 Time:  11:00  Group Topic/Focus:  Relapse Prevention Planning:   The focus of this group is to define relapse and discuss the need for planning to combat relapse.  Participation Level:  Active  Participation Quality:  Appropriate, Sharing and Supportive  Affect:  Appropriate  Cognitive:  Appropriate  Insight: Appropriate  Engagement in Group:  Engaged and Supportive  Modes of Intervention:  Discussion, Education and Support  Additional Comments: pt discussed ways to prevent relapses.   Chad Turner M 12/14/2012, 6:59 PM 

## 2012-12-14 NOTE — Progress Notes (Signed)
[facility] Group Notes:  (Counselor/Nursing/MHT/Case Management/Adjunct)  12/14/2012 1:15  Type of Therapy:  Group Therapy at 1:15 to 2:30  Participation Level:  Appropriate  Participation Quality:  Appropriate, Attentive and Sharing  Affect:  Appropriate  Cognitive:  Appropriate  Insight:  Developing/Improving  Engagement in Group:  Engaged  Engagement in Therapy:  Engaged  Modes of Intervention:  Discussion, Education, Scientist, physiological of Progress/Problems: Group session included an educational portion on Post Acute Withdrawal Syndrome (PAWS) and a processing portion on feelings about relapse and what, if anything, is the motive for recovery.  Mishon shared "his motivation for staying clean is health related as a spot was recently discovered on my liver."     [name]  12/14/2012 4:59 PM

## 2012-12-14 NOTE — Progress Notes (Signed)
D- Patient is out in milieu interacting with peers and attending groups.  Rates depression and hopelessness at 5. Denies SI and contracts for safety. C/O HA on patient inventory, but did not voice to staff.  Also c/o etoh cravings.  Vested in 12 step meetings.  A- Support and encouragement given.  Continue current POC and evaluation of treatment goals. Continue 15' checks for safety. R- Remains safe on the unit.

## 2012-12-14 NOTE — Progress Notes (Signed)
[name] Progress Note  12/14/2012 11:31 AM Chad Turner  MRN:  [id number] Subjective:  [name] has some concerns about his medical status. He is still weak, but his appetite is getting better. He is aware of the need for him to abstain specially so with what is going with his liver and Hgb. He is willing to follow up once he is discharged. He is still wanting to go back home and work on an outpatient basis. He is still working on a relapse prevention plan. Diagnosis:  Alcohol Dependence/withdrawal, cocaine abuse  ADL's:  Intact  Sleep: Fair  Appetite:  Fair  Suicidal Ideation:  Plan:  denies, Intent : Denies, Means: Denies Homicidal Ideation:  Plan:  Denies Intent:  Denies Means:  Denies AEB (as evidenced by):  Psychiatric Specialty Exam: Review of Systems  HENT: Negative.   Eyes: Negative.   Respiratory: Negative.   Cardiovascular: Negative.   Gastrointestinal: Negative.   Genitourinary: Negative.   Musculoskeletal: Negative.   Skin: Negative.   Neurological: Positive for weakness.  Endo/Heme/Allergies: Negative.   Psychiatric/Behavioral: Positive for substance abuse. The patient is nervous/anxious.     Blood pressure 108/74, pulse 108, temperature 98.6 F (37 C), temperature source Oral, resp. rate 16, height 5' 6.93" (1.7 m), weight 77.565 kg (171 lb).Body mass index is 26.84 kg/(m^2).  General Appearance: Fairly Groomed  Patent attorney::  Fair  Speech:  Clear and Coherent and Slow  Volume:  Decreased  Mood:  worried  Affect:  Restricted  Thought Process:  Coherent and Goal Directed  Orientation:  Full (Time, Place, and Person)  Thought Content:  WDL and worries, concerns  Suicidal Thoughts:  No  Homicidal Thoughts:  No  Memory:  Immediate;   Fair Recent;   Fair Remote;   Fair  Judgement:  Fair  Insight:  Present  Psychomotor Activity:  Decreased  Concentration:  Fair  Recall:  Fair  Akathisia:  No  Handed:  Right  AIMS (if indicated):     Assets:  Desire  for Improvement  Sleep:  Number of Hours: 5.75   Current Medications: Current Facility-Administered Medications  Medication Dose Route Frequency Provider Last Rate Last Dose  . acetaminophen (TYLENOL) tablet 650 mg  650 mg Oral Q6H PRN Kerry Hough, PA   650 mg at 12/12/12 0820  . albuterol (PROVENTIL HFA;VENTOLIN HFA) 108 (90 BASE) MCG/ACT inhaler 2 puff  2 puff Inhalation Q6H PRN Kerry Hough, PA      . alum & mag hydroxide-simeth (MAALOX/MYLANTA) 200-200-20 MG/5ML suspension 30 mL  30 mL Oral Q4H PRN Kerry Hough, PA      . Melene Muller ON 12/15/2012] chlordiazePOXIDE (LIBRIUM) capsule 25 mg  25 mg Oral Daily Kerry Hough, PA      . cloNIDine (CATAPRES - Dosed in mg/24 hr) patch 0.1 mg  0.1 mg Transdermal Weekly Kerry Hough, PA   0.1 mg at 12/11/12 0008  . feeding supplement (ENSURE COMPLETE) liquid 237 mL  237 mL Oral BID BM Jeoffrey Massed, RD   237 mL at 12/13/12 1000  . ferrous sulfate tablet 325 mg  325 mg Oral BID WC Sanjuana Kava, NP   325 mg at 12/14/12 1478  . magnesium hydroxide (MILK OF MAGNESIA) suspension 30 mL  30 mL Oral Daily PRN Kerry Hough, PA      . multivitamin with minerals tablet 1 tablet  1 tablet Oral Daily Kerry Hough, PA   1 tablet at 12/14/12 0851  .  thiamine (B-1) injection 100 mg  100 mg Intramuscular Once Kerry Hough, PA      . thiamine (VITAMIN B-1) tablet 100 mg  100 mg Oral Daily Kerry Hough, PA   100 mg at 12/14/12 0851  . traZODone (DESYREL) tablet 50 mg  50 mg Oral QHS,MR X 1 Kerry Hough, PA   50 mg at 12/13/12 2133    Lab Results: No results found for this or any previous visit (from the past 48 hour(s)).  Physical Findings: AIMS: Facial and Oral Movements Muscles of Facial Expression: None, normal Lips and Perioral Area: None, normal Jaw: None, normal Tongue: None, normal,Extremity Movements Upper (arms, wrists, hands, fingers): None, normal Lower (legs, knees, ankles, toes): None, normal, Trunk Movements Neck,  shoulders, hips: None, normal, Overall Severity Severity of abnormal movements (highest score from questions above): None, normal Incapacitation due to abnormal movements: None, normal Patient's awareness of abnormal movements (rate only patient's report): No Awareness, Dental Status Current problems with teeth and/or dentures?: No Does patient usually wear dentures?: No  CIWA:  CIWA-Ar Total: 1 COWS:     Treatment Plan Summary: Daily contact with patient to assess and evaluate symptoms and progress in treatment Medication management  Plan: Supportive approach/coping skills/relapse prevention             Medical Decision Making Problem Points:  Review of last therapy session (1) and Review of psycho-social stressors (1) Data Points:  Review or order clinical lab tests (1)  I certify that inpatient services furnished can reasonably be expected to improve the patient's condition.   Ralynn San A 12/14/2012, 11:31 AM

## 2012-12-14 NOTE — Care Management Utilization Note (Signed)
PER STATE REGULATIONS 482.30  THIS CHART WAS REVIEWED FOR MEDICAL NECESSITY WITH RESPECT TO THE PATIENT'S ADMISSION/ DURATION OF STAY.  NEXT REVIEW DATE: 12/17/2012

## 2012-12-14 NOTE — Progress Notes (Signed)
Pt is resting in bed with eyes closed. RR WNL, even and unlabored. Level III obs in place and pt remains safe. Chad Turner  

## 2012-12-15 NOTE — Progress Notes (Addendum)
Adult Psychoeducational Group Note  Date:  12/15/2012 Time:  0900  Group Topic/Focus:  Self Inventory Review  Participation Level:  Active  Participation Quality:  Appropriate and Sharing  Affect:  appropriate  Cognitive:  Alert and Appropriate  Insight: Limited  Engagement in Group:  Engaged  Modes of Intervention:  Education  Additional Comments:   Kenniya Westrich Shari Prows 12/15/2012, 11:03 AM

## 2012-12-15 NOTE — Progress Notes (Signed)
Adult Psychoeducational Group Note  Date:  12/15/2012 Time:  1330  Group Topic/Focus:  Building Self Esteem:   The Focus of this group is helping patients become aware of the effects of self-esteem on their lives, the things they and others do that enhance or undermine their self-esteem, seeing the relationship between their level of self-esteem and the choices they make and learning ways to enhance self-esteem.  Participation Level:  Active  Participation Quality:  Appropriate and Attentive  Affect:  Anxious and Appropriate  Cognitive:  Alert, Appropriate and Oriented  Insight: Improving  Engagement in Group:  Developing/Improving and Engaged  Modes of Intervention:  Clarification, Discussion, Exploration and Problem-solving  Additional Comments:  Very engaged in discussion, coordinating his thoughts and actions to personal goal.  Roselee Culver 12/15/2012, 2:52 PM

## 2012-12-15 NOTE — Progress Notes (Signed)
D.  Pt. Interacting appropriately with peers and staff.  Denies SI/HI and denies A/V hallucinations.  No concerns or issues voiced at present.  Denies pain. A.  Encouraged pt to notify staff with any concerns or issues.   R.  Pt. Remains safe.

## 2012-12-15 NOTE — Progress Notes (Signed)
Patient ID: Chad Turner, male   DOB: [birth year], 52 y.o.   MRN: [id number] D)  Was sleeping at the beginning of the shift, was awakened when I made rounds and reminded him that group would be starting soon, but he didn't want to go.  When rounds were made later, he was easily awakened and told that group was over and everyone was getting hs snacks.  He got up, came down to the dayroom for a little while, watched tv, then came to med window.  Was bright, pleasant, interacting with few select peers.  Denies thoughts of self harm. A)  Encourage to attend groups, continue to monitor for safety, continue POC R)  Remains safe on unit.

## 2012-12-15 NOTE — Progress Notes (Signed)
Patient ID: SARON TWEED, male   DOB: 1961/08/28, 52 y.o.   MRN: [id number] D: Pt is awake and active on the unit this AM. Pt denies SI/HI and A/V hallucinations. Pt is participating in the milieu and is cooperative with staff. Pt rates their depression at 5 and hopelessness at 5. Pt's most recent CIWA score was 2. Pt writes that he plans to attend AA meetings after discharge. Pt's mood is depressed and his affect is flat. Pt c/o not having enough time to finish his meals. Writer suggested that he get to the front of the line so that he can have seconds.   A: Writer utilized therapeutic communication, encouraged pt to discuss feelings with staff and administered medication per MD orders. Writer also encouraged pt to attend groups.  R: Pt is attending groups and tolerating medications well. Writer will continue to monitor. 15 minute checks are ongoing for safety.

## 2012-12-15 NOTE — Progress Notes (Signed)
[facility] MD Progress Note  12/15/2012 7:54 PM Chad Turner  MRN:  [id number] Subjective:  Chad Turner reports that he slept soso.  He seems more positive today, but that may relate to him being seen lining up to go to recc.  Diagnosis:  Alcohol Dependence/withdrawal, cocaine abuse  ADL's:  Intact  Sleep: Fair  Appetite:  Fair  Suicidal Ideation:  Plan:  denies, Intent : Denies, Means: Denies Homicidal Ideation:  Plan:  Denies Intent:  Denies Means:  Denies AEB (as evidenced by):  Psychiatric Specialty Exam: Review of Systems  HENT: Negative.   Eyes: Negative.   Respiratory: Negative.   Cardiovascular: Negative.   Gastrointestinal: Negative.   Genitourinary: Negative.   Musculoskeletal: Negative.   Skin: Negative.   Neurological: Positive for weakness.  Endo/Heme/Allergies: Negative.   Psychiatric/Behavioral: Positive for substance abuse. The patient is nervous/anxious.     Blood pressure 125/80, pulse 101, temperature 97.5 F (36.4 C), temperature source Oral, resp. rate 20, height 5' 6.93" (1.7 m), weight 77.565 kg (171 lb).Body mass index is 26.84 kg/(m^2).  General Appearance: Fairly Groomed  Patent attorney::  Fair  Speech:  Clear and Coherent and Slow  Volume:  Decreased  Mood:  worried  Affect:  Restricted  Thought Process:  Coherent and Goal Directed  Orientation:  Full (Time, Place, and Person)  Thought Content:  WDL and worries, concerns  Suicidal Thoughts:  No  Homicidal Thoughts:  No  Memory:  Immediate;   Fair Recent;   Fair Remote;   Fair  Judgement:  Fair  Insight:  Present  Psychomotor Activity:  Decreased  Concentration:  Fair  Recall:  Fair  Akathisia:  No  Handed:  Right  AIMS (if indicated):     Assets:  Desire for Improvement  Sleep:  Number of Hours: 5.75   Current Medications: Current Facility-Administered Medications  Medication Dose Route Frequency Provider Last Rate Last Dose  . acetaminophen (TYLENOL) tablet 650 mg  650 mg Oral Q6H  PRN Kerry Hough, PA   650 mg at 12/12/12 0820  . albuterol (PROVENTIL HFA;VENTOLIN HFA) 108 (90 BASE) MCG/ACT inhaler 2 puff  2 puff Inhalation Q6H PRN Kerry Hough, PA      . alum & mag hydroxide-simeth (MAALOX/MYLANTA) 200-200-20 MG/5ML suspension 30 mL  30 mL Oral Q4H PRN Kerry Hough, PA      . cloNIDine (CATAPRES - Dosed in mg/24 hr) patch 0.1 mg  0.1 mg Transdermal Weekly Kerry Hough, PA   0.1 mg at 12/11/12 0008  . feeding supplement (ENSURE COMPLETE) liquid 237 mL  237 mL Oral BID BM Jeoffrey Massed, RD   237 mL at 12/15/12 1120  . ferrous sulfate tablet 325 mg  325 mg Oral BID WC Sanjuana Kava, NP   325 mg at 12/15/12 1715  . magnesium hydroxide (MILK OF MAGNESIA) suspension 30 mL  30 mL Oral Daily PRN Kerry Hough, PA      . multivitamin with minerals tablet 1 tablet  1 tablet Oral Daily Kerry Hough, PA   1 tablet at 12/15/12 [phone]  . thiamine (B-1) injection 100 mg  100 mg Intramuscular Once Kerry Hough, PA      . thiamine (VITAMIN B-1) tablet 100 mg  100 mg Oral Daily Kerry Hough, PA   100 mg at 12/15/12 0824  . traZODone (DESYREL) tablet 50 mg  50 mg Oral QHS,MR X 1 Kerry Hough, PA   50 mg at 12/14/12 2148  Lab Results: No results found for this or any previous visit (from the past 48 hour(s)).  Physical Findings: AIMS: Facial and Oral Movements Muscles of Facial Expression: None, normal Lips and Perioral Area: None, normal Jaw: None, normal Tongue: None, normal,Extremity Movements Upper (arms, wrists, hands, fingers): None, normal Lower (legs, knees, ankles, toes): None, normal, Trunk Movements Neck, shoulders, hips: None, normal, Overall Severity Severity of abnormal movements (highest score from questions above): None, normal Incapacitation due to abnormal movements: None, normal Patient's awareness of abnormal movements (rate only patient's report): No Awareness, Dental Status Current problems with teeth and/or dentures?: No Does patient  usually wear dentures?: No  CIWA:  CIWA-Ar Total: 0 COWS:     Treatment Plan Summary: Daily contact with patient to assess and evaluate symptoms and progress in treatment Medication management  Plan: Supportive approach/coping skills/relapse prevention             Medical Decision Making Problem Points:  Review of last therapy session (1) and Review of psycho-social stressors (1) Data Points:  Review or order clinical lab tests (1)  I certify that inpatient services furnished can reasonably be expected to improve the patient's condition.   [name] 12/15/2012, 7:54 PM

## 2012-12-15 NOTE — Clinical Social Work Note (Signed)
[facility] Group Notes:  (Clinical Social Work)  12/15/2012     10-11AM  Summary of Progress/Problems:   The main focus of today's process group was for the patient to identify ways in which they have in the past sabotaged their own recovery. Motivational Interviewing was utilized to ask the group members what they get out of their substance use, and what they want to change.  The Stages of Change were explained, and members identified where they currently are with regard to stages of change.  The patient expressed that he self sabotages by thinking a period of sobriety means he "got this licked" and gives himself permission to drink again.  He stated that drinking gives him the opportunity to get away from his problems "for a minute or two."  He states that he really has been beating himself up, but that just makes the problem worse.  He reports that he now feels he is in the preparation stage and is taking advantage of his time at [facility] to make preparations for action.  Type of Therapy:  Group Therapy - Process   Participation Level:  Active  Participation Quality:  Attentive, Sharing and Supportive  Affect:  Blunted  Cognitive:  Appropriate and Oriented  Insight:  Engaged  Engagement in Therapy:  Engaged  Modes of Intervention:  Education, Teacher, English as a foreign language, Exploration, Discussion, Motivational Interviewing   Ambrose Mantle, LCSW 12/15/2012, 12:57 PM

## 2012-12-16 NOTE — Progress Notes (Signed)
[facility] MD Progress Note  12/16/2012 11:42 AM EDRICK WHITEHORN  MRN:  [id number] Subjective:  Nilson reports that he is doing okay.  He was challenged to participate in recc when he goes.  He used his replaced knees an excuse to not do anything, not even catch a ball while seated..  Diagnosis:  Alcohol Dependence/withdrawal, cocaine abuse  ADL's:  Intact  Sleep: Fair  Appetite:  Fair  Suicidal Ideation:  Plan:  denies, Intent : Denies, Means: Denies Homicidal Ideation:  Plan:  Denies Intent:  Denies Means:  Denies AEB (as evidenced by):  Psychiatric Specialty Exam: Review of Systems  HENT: Negative.   Eyes: Negative.   Respiratory: Negative.   Cardiovascular: Negative.   Gastrointestinal: Negative.   Genitourinary: Negative.   Musculoskeletal: Negative.   Skin: Negative.   Neurological: Positive for weakness.  Endo/Heme/Allergies: Negative.   Psychiatric/Behavioral: Positive for substance abuse. The patient is nervous/anxious.     Blood pressure 111/75, pulse 91, temperature 98.3 F (36.8 C), temperature source Oral, resp. rate 16, height 5' 6.93" (1.7 m), weight 77.565 kg (171 lb).Body mass index is 26.84 kg/(m^2).  General Appearance: Fairly Groomed  Patent attorney::  Fair  Speech:  Clear and Coherent and Slow  Volume:  Decreased  Mood:  worried  Affect:  Restricted  Thought Process:  Coherent and Goal Directed  Orientation:  Full (Time, Place, and Person)  Thought Content:  WDL and worries, concerns  Suicidal Thoughts:  No  Homicidal Thoughts:  No  Memory:  Immediate;   Fair Recent;   Fair Remote;   Fair  Judgement:  Fair  Insight:  Present  Psychomotor Activity:  Decreased  Concentration:  Fair  Recall:  Fair  Akathisia:  No  Handed:  Right  AIMS (if indicated):     Assets:  Desire for Improvement  Sleep:  Number of Hours: 5   Current Medications: Current Facility-Administered Medications  Medication Dose Route Frequency Provider Last Rate Last Dose  .  acetaminophen (TYLENOL) tablet 650 mg  650 mg Oral Q6H PRN Kerry Hough, PA   650 mg at 12/12/12 0820  . albuterol (PROVENTIL HFA;VENTOLIN HFA) 108 (90 BASE) MCG/ACT inhaler 2 puff  2 puff Inhalation Q6H PRN Kerry Hough, PA      . alum & mag hydroxide-simeth (MAALOX/MYLANTA) 200-200-20 MG/5ML suspension 30 mL  30 mL Oral Q4H PRN Kerry Hough, PA      . cloNIDine (CATAPRES - Dosed in mg/24 hr) patch 0.1 mg  0.1 mg Transdermal Weekly Kerry Hough, PA   0.1 mg at 12/11/12 0008  . feeding supplement (ENSURE COMPLETE) liquid 237 mL  237 mL Oral BID BM Jeoffrey Massed, RD   237 mL at 12/16/12 1055  . ferrous sulfate tablet 325 mg  325 mg Oral BID WC Sanjuana Kava, NP   325 mg at 12/16/12 0844  . magnesium hydroxide (MILK OF MAGNESIA) suspension 30 mL  30 mL Oral Daily PRN Kerry Hough, PA      . multivitamin with minerals tablet 1 tablet  1 tablet Oral Daily Kerry Hough, PA   1 tablet at 12/16/12 0844  . thiamine (B-1) injection 100 mg  100 mg Intramuscular Once Kerry Hough, PA      . thiamine (VITAMIN B-1) tablet 100 mg  100 mg Oral Daily Kerry Hough, PA   100 mg at 12/16/12 0844  . traZODone (DESYREL) tablet 50 mg  50 mg Oral QHS,MR X 1  Kerry Hough, PA   50 mg at 12/15/12 2148    Lab Results: No results found for this or any previous visit (from the past 72 hour(s)).  Physical Findings: AIMS: Facial and Oral Movements Muscles of Facial Expression: None, normal Lips and Perioral Area: None, normal Jaw: None, normal Tongue: None, normal,Extremity Movements Upper (arms, wrists, hands, fingers): None, normal Lower (legs, knees, ankles, toes): None, normal, Trunk Movements Neck, shoulders, hips: None, normal, Overall Severity Severity of abnormal movements (highest score from questions above): None, normal Incapacitation due to abnormal movements: None, normal Patient's awareness of abnormal movements (rate only patient's report): No Awareness, Dental Status Current  problems with teeth and/or dentures?: No Does patient usually wear dentures?: No  CIWA:  CIWA-Ar Total: 0 COWS:     Treatment Plan Summary: Daily contact with patient to assess and evaluate symptoms and progress in treatment Medication management  Plan: Supportive approach/coping skills/relapse prevention           Assment: pt sees no reason to do anything different.    Medical Decision Making Problem Points:  Review of last therapy session (1) and Review of psycho-social stressors (1) Data Points:  Review or order clinical lab tests (1) Review of medication regiment & side effects (2)  I certify that inpatient services furnished can reasonably be expected to improve the patient's condition.   Jamee Pacholski 12/16/2012, 11:42 AM

## 2012-12-16 NOTE — Progress Notes (Signed)
[facility] Group Notes:  (Nursing/MHT/Case Management/Adjunct)  Date:  12/16/2012  Time:  5:58 AM  Type of Therapy:  AA  Participation Level:  Active  Participation Quality:  Appropriate  Affect:  Appropriate  Cognitive:  Appropriate  Insight:  Appropriate  Engagement in Group:  Engaged  Modes of Intervention:  Discussion and Support  Summary of Progress/Problems: Patient remained in group the entire time.  Tomi Bamberger Coursey 12/16/2012, 5:58 AM

## 2012-12-16 NOTE — Progress Notes (Signed)
Patient did attend the evening speaker AA meeting.  

## 2012-12-16 NOTE — Progress Notes (Addendum)
Patient ID: Chad Turner, male   DOB: 1961/05/31, 52 y.o.   MRN: [id number] D: Pt is awake and active on the unit this AM. Pt denies SI/HI and A/V hallucinations. Pt is participating in the milieu and is cooperative with staff. Pt rates their depression at 5 and hopelessness at 5. Pt's most recent CIWA score was 0. Pt mood is depressed and his affect is flat/sad. Pt writes that he intends to attend AA meetings after discharge. Pt forwards little to staff but is interacting well with his peers.   A: Writer utilized therapeutic communication, encouraged pt to discuss feelings with staff and administered medication per MD orders. Writer also encouraged pt to attend groups.  R: Pt is attending groups and tolerating medications well. Writer will continue to monitor. 15 minute checks are ongoing for safety.

## 2012-12-16 NOTE — Clinical Social Work Note (Signed)
[facility] Group Notes:  (Clinical Social Work)  12/16/2012  10:00-11:00AM  Summary of Progress/Problems:   The main focus of today's process group was to define "support" and describe what healthy supports are, then to identify the patient's current support system and decide on other supports that can be put in place to prevent future hospitalizations.   Handouts were used, and a lengthy, lively discussion was held about looking at things from several perspectives.  The patient expressed complete understanding of the topic and a willingness to grow his support system.  Type of Therapy:  Process Group with Motivational Interviewing  Participation Level:  Active  Participation Quality:  Attentive and Sharing  Affect:  Appropriate and Blunted  Cognitive:  Oriented  Insight:  Engaged  Engagement in Therapy:  Engaged  Modes of Intervention:  Clarification, Education, Limit-setting, Problem-solving, Socialization, Support and Processing, Exploration, Discussion, Role-Play   Ambrose Mantle, LCSW 12/16/2012, 11:12 AM

## 2012-12-17 MED ORDER — TRAZODONE HCL 50 MG PO TABS: 50.0000 mg | ORAL_TABLET | Freq: Every evening | ORAL | Status: DC | PRN

## 2012-12-17 MED ORDER — FERROUS SULFATE 325 (65 FE) MG PO TABS: 325.0000 mg | ORAL_TABLET | Freq: Two times a day (BID) | ORAL | Status: DC

## 2012-12-17 MED ORDER — CLONIDINE HCL 0.1 MG/24HR TD PTWK: 1.0000 | MEDICATED_PATCH | TRANSDERMAL | Status: DC

## 2012-12-17 NOTE — Tx Team (Signed)
Interdisciplinary Treatment Plan Update (Adult)  Date: 12/17/2012  Time Reviewed: 9:59 AM   Progress in Treatment: Attending groups: Yes Participating in groups: Yes Taking medication as prescribed:  Yes Tolerating medication:  Yes Family/Significant othe contact made: Patient refused consent Patient understands diagnosis: Yes Discussing patient identified problems/goals with staff: Yes Medical problems stabilized or resolved:  Yes Denies suicidal/homicidal ideation: Yes Issues/concerns per patient self-inventory:  None noted Other: N/A  New problem(s) identified: None Identified  Reason for Continuation of Hospitalization: Withdrawal symptoms  Interventions implemented related to continuation of hospitalization: mood stabilization, medication monitoring and adjustment, group therapy and psycho education, suicide risk assessment, collateral contact, aftercare planning, ongoing physician assessments and safety checks q 15 mins  Additional comments: N/A  Estimated length of stay: Discharge today  Discharge Plan:  CSW will followup at [facility] and [facility], attend AA meetings near home at [employer] and have daily contact with sponsor.   New goal(s): N/A  Review of initial/current patient goals per problem list:     1.  Goal: Patient will be able to identify effective and ineffective coping patterns  Met: Yes  As evidenced by: Patient participation in groups and staff observations 2. Complete Detox Protocol and Identify comprehensive mental wellness and sobriety plan  Met: Yes As evidenced [facility]:QION report, and with support of CSW knowing follow up plan    Attendees: Patient:     Family:     Physician:  Geoffery Lyons 12/17/2012  9:59 AM   Nursing:   Robbie Louis, RN [phone]   9:59  AM   Clinical Social Worker Ronda Fairly 12/17/2012  9:59 AM   Other: Roswell Miners, RN 12/17/2012  9:59 AM   Other:  Olivia Mackie, Psych Intern 12/17/2012  9:59 AM   Other:  Michae Kava, PA Student 12/17/2012   9:59 AM  Other:      Scribe for Treatment Team:   Carney Bern, LCSWA  12/17/2012 9:59 AM

## 2012-12-17 NOTE — Progress Notes (Signed)
[facility] Adult Case Management Discharge Plan :  Will you be returning to the same living situation after discharge: Yes,  apartment At discharge, do you have transportation home?:Yes,  bus voucher or firend if he can reach by telephone Do you have the ability to pay for your medications:Yes,  at [facility]  Release of information consent forms completed and in the chart;  Patient's signature needed at discharge.  Patient to Follow up at: Follow-up Information   Follow up with [facility] On 12/20/2012. (Go to walkin clinic on 2/20 between 8:30 and 12:30 PM)    Contact information:   [address] [place] [address] [facility] [phone] Valinda Hoar [phone]      Patient denies SI/HI:   Yes,  denies both    Safety Planning and Suicide Prevention discussed:  Yes,  with patient  [name] 12/17/2012,
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
## 2012-12-17 NOTE — Progress Notes (Signed)
Adult Psychoeducational Group Note  Date:  12/17/2012 Time:  12:06 PM  Group Topic/Focus:  Three minute breathing exercise with CD. Group talked about their experience with relaxtion, meditation and breathing exercises. We did the activity and then talked about the experience. Pts able to talk about coping techniques that worked and ones that did not.   Participation Level:  Minimal  Participation Quality:  Attentive  Affect:  Flat  Cognitive:  Alert  Insight: Good  Engagement in Group:  Limited  Modes of Intervention:  Discussion  Additional Comments:  Pt did not talk in group but was alert. Pt shared he is prepared for discharge.  Chad Turner T 12/17/2012, 12:06 PM
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
## 2012-12-17 NOTE — Progress Notes (Signed)
Patient ID: Chad Turner, male   DOB: 1960-12-04, 52 y.o.   MRN: [id number] He has been discharged home and was picked up by his sister. He voiced understanding of discharge instruction and of follow up plan.  He denies thoughts of SI and stated that he plans to stay clean. All belongings taken home with him.

## 2012-12-17 NOTE — Progress Notes (Signed)
[facility] LCSW Aftercare Discharge Planning Group Note  12/17/2012  8:45 AM  Participation Quality:  Appropriate  Affect:  Appropriate  Cognitive:  Appropriate  Insight:  Developing/Improving  Engagement in Group:  Developing/Improving  Modes of Intervention:  Clarification, Exploration, Problem-solving, Socialization and Support  Summary of Progress/Problems:     Pt denies both suicidal ideation and homicidal ideation.  On a scale of 1 to 10 with ten being the most ever experienced, the patient rates depression at a 0 and anxiety at a 0. Chad Turner reports he feels ready to leave and intends to attend AA meeting later today at [employer] which is near his home. Patient has contacted sponsor and will also be contacting him daily for 90 days and attending classes at [facility] "in order to not sit in front of the TV all day." Others in group thanked [name] for his honesty.     [name] 12/17/2012,

## 2012-12-17 NOTE — Progress Notes (Signed)
Patient ID: Chad Turner, male   DOB: [birth year], 52 y.o.   MRN: [id number] D)  Maxx has been up and about more each day, rather quiet, but out in the dayroom, watching tv this evening, attended group afterward.  Feels he is learning and especially enjoyed the group last night, but not sure he can really stay sober the way he should.  States feels a little better tonight, headache he had earlier is gone.  Had a snack and watched a little more tv before coming for meds and getting ready for bed. Denies thoughts of self harm A)  Will continue to monitor for safety, continue POC R)  Remains safe of unit.

## 2012-12-21 NOTE — Progress Notes (Signed)
Patient Discharge Instructions:  After Visit Summary (AVS):   Faxed to:  12/21/12 Discharge Summary Note:   Faxed to:  12/21/12 Psychiatric Admission Assessment Note:   Faxed to:  12/21/12 Suicide Risk Assessment - Discharge Assessment:   Faxed to:  12/21/12 Faxed/Sent to the Next Level Care provider:  12/21/12 Faxed to [facility] [employer], 12/21/2012, 3:13 PM

## 2012-12-28 NOTE — Discharge Summary (Signed)
Agree with assessment and plan Chad Turner, M.D. 

## 2013-02-28 ENCOUNTER — Emergency Department (INDEPENDENT_AMBULATORY_CARE_PROVIDER_SITE_OTHER): Payer: Medicaid Other

## 2013-02-28 ENCOUNTER — Encounter (HOSPITAL_COMMUNITY): Payer: Self-pay | Admitting: *Deleted

## 2013-02-28 ENCOUNTER — Emergency Department (HOSPITAL_COMMUNITY): Payer: Medicare Other

## 2013-02-28 ENCOUNTER — Emergency Department (INDEPENDENT_AMBULATORY_CARE_PROVIDER_SITE_OTHER)
Admission: EM | Admit: 2013-02-28 | Discharge: 2013-02-28 | Disposition: A | Discharge status: Home / Self Care | Payer: Medicare Other | Source: Home / Self Care

## 2013-02-28 DIAGNOSIS — R141 Gas pain: Secondary | ICD-10-CM

## 2013-02-28 DIAGNOSIS — R160 Hepatomegaly, not elsewhere classified: Secondary | ICD-10-CM

## 2013-02-28 DIAGNOSIS — K2920 Alcoholic gastritis without bleeding: Secondary | ICD-10-CM

## 2013-02-28 DIAGNOSIS — R142 Eructation: Secondary | ICD-10-CM

## 2013-02-28 DIAGNOSIS — R140 Abdominal distension (gaseous): Secondary | ICD-10-CM

## 2013-02-28 DIAGNOSIS — R1110 Vomiting, unspecified: Secondary | ICD-10-CM

## 2013-02-28 LAB — CBC WITH DIFFERENTIAL/PLATELET
Basophils Absolute: 0 10*3/uL (ref 0.0–0.1)
Basophils Relative: 0 % (ref 0–1)
Eosinophils Absolute: 0 10*3/uL (ref 0.0–0.7)
Eosinophils Relative: 0 % (ref 0–5)
HCT: 42.6 % (ref 39.0–52.0)
Hemoglobin: 15.5 g/dL (ref 13.0–17.0)
Lymphocytes Relative: 39 % (ref 12–46)
Lymphs Abs: 1.8 10*3/uL (ref 0.7–4.0)
MCH: 29.8 pg (ref 26.0–34.0)
MCHC: 36.4 g/dL — ABNORMAL HIGH (ref 30.0–36.0)
MCV: 81.9 fL (ref 78.0–100.0)
Monocytes Absolute: 0.6 10*3/uL (ref 0.1–1.0)
Monocytes Relative: 12 % (ref 3–12)
Neutro Abs: 2.3 10*3/uL (ref 1.7–7.7)
Neutrophils Relative %: 49 % (ref 43–77)
Platelets: 131 10*3/uL — ABNORMAL LOW (ref 150–400)
RBC: 5.2 MIL/uL (ref 4.22–5.81)
RDW: 12.9 % (ref 11.5–15.5)
WBC: 4.6 10*3/uL (ref 4.0–10.5)

## 2013-02-28 LAB — COMPREHENSIVE METABOLIC PANEL
ALT: 87 U/L — ABNORMAL HIGH (ref 0–53)
AST: 167 U/L — ABNORMAL HIGH (ref 0–37)
Albumin: 3.8 g/dL (ref 3.5–5.2)
Alkaline Phosphatase: 111 U/L (ref 39–117)
BUN: 3 mg/dL — ABNORMAL LOW (ref 6–23)
CO2: 25 mEq/L (ref 19–32)
Calcium: 9.8 mg/dL (ref 8.4–10.5)
Chloride: 94 mEq/L — ABNORMAL LOW (ref 96–112)
Creatinine, Ser: 0.65 mg/dL (ref 0.50–1.35)
GFR calc Af Amer: >90 mL/min (ref >90)
GFR calc non Af Amer: >90 mL/min (ref >90)
Glucose, Bld: 129 mg/dL — ABNORMAL HIGH (ref 70–99)
Potassium: 3.6 mEq/L (ref 3.5–5.1)
Sodium: 131 mEq/L — ABNORMAL LOW (ref 135–145)
Total Bilirubin: 1.6 mg/dL — ABNORMAL HIGH (ref 0.3–1.2)
Total Protein: 8.2 g/dL (ref 6.0–8.3)

## 2013-02-28 LAB — POCT URINALYSIS DIP (DEVICE)
Bilirubin Urine: NEGATIVE
Glucose, UA: NEGATIVE mg/dL
Ketones, ur: NEGATIVE mg/dL
Leukocytes, UA: NEGATIVE
Nitrite: NEGATIVE
Protein, ur: NEGATIVE mg/dL
Specific Gravity, Urine: 1.015 (ref 1.005–1.030)
Urobilinogen, UA: 0.2 mg/dL (ref 0.0–1.0)
pH: 6 (ref 5.0–8.0)

## 2013-02-28 MED ORDER — ONDANSETRON HCL 4 MG PO TABS: 4.0000 mg | ORAL_TABLET | Freq: Four times a day (QID) | ORAL | Status: DC

## 2013-02-28 MED ORDER — OMEPRAZOLE 20 MG PO CPDR: 20.0000 mg | DELAYED_RELEASE_CAPSULE | Freq: Every day | ORAL | Status: DC

## 2013-02-28 MED ORDER — ONDANSETRON 4 MG PO TBDP
4.0000 mg | ORAL_TABLET | Freq: Once | ORAL | Status: AC
  Administered 2013-02-28: 4 mg via ORAL

## 2013-02-28 MED ORDER — ONDANSETRON HCL 4 MG/2ML IJ SOLN: 4.0000 mg | Freq: Once | INTRAMUSCULAR | Status: DC

## 2013-02-28 MED ORDER — ONDANSETRON 4 MG PO TBDP
ORAL_TABLET | ORAL | Status: AC
  Filled 2013-02-28: qty 1

## 2013-02-28 MED ORDER — SODIUM CHLORIDE 0.9 % IV SOLN: Freq: Once | INTRAVENOUS | Status: DC

## 2013-02-28 MED ORDER — ONDANSETRON HCL 4 MG/2ML IJ SOLN
INTRAMUSCULAR | Status: AC
  Filled 2013-02-28: qty 2

## 2013-02-28 NOTE — ED Notes (Addendum)
Pt reports 5 days of N, V and D with cramping at times.  He denies fever.  He is drinking fluids and is able to keep that down, but he is not eating a lot of solid food.  He family has had recent similar sxs
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
## 2013-02-28 NOTE — ED Notes (Signed)
Attempted 2 !V sticks--right forearm and right hand without success.  D Mabe notified

## 2013-03-01 NOTE — ED Provider Notes (Signed)
Medical screening examination/treatment/procedure(s) were performed by non-physician practitioner and as supervising physician I was immediately available for consultation/collaboration.   MORENO-COLL,Micayla Brathwaite; MD  Lorenso Quirino Moreno-Coll, MD 03/01/13 1024 

## 2013-05-21 ENCOUNTER — Emergency Department (EMERGENCY_DEPARTMENT_HOSPITAL)
Admission: EM | Admit: 2013-05-21 | Discharge: 2013-05-22 | Disposition: A | Discharge status: Other Acute Inpatient Hospital | Payer: Medicare Other | Source: Home / Self Care | Attending: Emergency Medicine | Admitting: Emergency Medicine

## 2013-05-21 ENCOUNTER — Emergency Department (HOSPITAL_COMMUNITY): Payer: Medicare Other

## 2013-05-21 ENCOUNTER — Encounter (HOSPITAL_COMMUNITY): Payer: Self-pay

## 2013-05-21 DIAGNOSIS — F1020 Alcohol dependence, uncomplicated: Secondary | ICD-10-CM

## 2013-05-21 LAB — COMPREHENSIVE METABOLIC PANEL
ALT: 124 U/L — ABNORMAL HIGH (ref 0–53)
AST: 250 U/L — ABNORMAL HIGH (ref 0–37)
Albumin: 3.4 g/dL — ABNORMAL LOW (ref 3.5–5.2)
Alkaline Phosphatase: 143 U/L — ABNORMAL HIGH (ref 39–117)
BUN: 5 mg/dL — ABNORMAL LOW (ref 6–23)
CO2: 20 mEq/L (ref 19–32)
Calcium: 9 mg/dL (ref 8.4–10.5)
Chloride: 91 mEq/L — ABNORMAL LOW (ref 96–112)
Creatinine, Ser: 0.74 mg/dL (ref 0.50–1.35)
GFR calc Af Amer: >90 mL/min (ref >90)
GFR calc non Af Amer: >90 mL/min (ref >90)
Glucose, Bld: 101 mg/dL — ABNORMAL HIGH (ref 70–99)
Potassium: 4.1 mEq/L (ref 3.5–5.1)
Sodium: 126 mEq/L — ABNORMAL LOW (ref 135–145)
Total Bilirubin: 0.7 mg/dL (ref 0.3–1.2)
Total Protein: 7.8 g/dL (ref 6.0–8.3)

## 2013-05-21 LAB — POCT I-STAT, CHEM 8
BUN: 4 mg/dL — ABNORMAL LOW (ref 6–23)
Calcium, Ion: 1.1 mmol/L — ABNORMAL LOW (ref 1.12–1.23)
Chloride: 101 mEq/L (ref 96–112)
Creatinine, Ser: 0.9 mg/dL (ref 0.50–1.35)
Glucose, Bld: 91 mg/dL (ref 70–99)
HCT: 40 % (ref 39.0–52.0)
Hemoglobin: 13.6 g/dL (ref 13.0–17.0)
Potassium: 4.7 mEq/L (ref 3.5–5.1)
Sodium: 131 mEq/L — ABNORMAL LOW (ref 135–145)
TCO2: 23 mmol/L (ref 0–100)

## 2013-05-21 LAB — CBC
HCT: 39.7 % (ref 39.0–52.0)
Hemoglobin: 13.5 g/dL (ref 13.0–17.0)
MCH: 30.5 pg (ref 26.0–34.0)
MCHC: 34 g/dL (ref 30.0–36.0)
MCV: 89.6 fL (ref 78.0–100.0)
Platelets: 240 10*3/uL (ref 150–400)
RBC: 4.43 MIL/uL (ref 4.22–5.81)
RDW: 13.5 % (ref 11.5–15.5)
WBC: 4.5 10*3/uL (ref 4.0–10.5)

## 2013-05-21 LAB — LIPASE, BLOOD: Lipase: 55 U/L (ref 11–59)

## 2013-05-21 LAB — RAPID URINE DRUG SCREEN, HOSP PERFORMED
Amphetamines: NOT DETECTED
Barbiturates: NOT DETECTED
Benzodiazepines: NOT DETECTED
Cocaine: POSITIVE — AB
Opiates: NOT DETECTED
Tetrahydrocannabinol: NOT DETECTED

## 2013-05-21 LAB — ACETAMINOPHEN LEVEL: Acetaminophen (Tylenol), Serum: <15 ug/mL (ref 10–30)

## 2013-05-21 LAB — ETHANOL: Alcohol, Ethyl (B): 61 mg/dL — ABNORMAL HIGH (ref 0–11)

## 2013-05-21 LAB — SALICYLATE LEVEL: Salicylate Lvl: <2 mg/dL — ABNORMAL LOW (ref 2.8–20.0)

## 2013-05-21 MED ORDER — TRAZODONE HCL 50 MG PO TABS: 50.0000 mg | ORAL_TABLET | Freq: Every evening | ORAL | Status: DC | PRN

## 2013-05-21 MED ORDER — LORAZEPAM 1 MG PO TABS: 1.0000 mg | ORAL_TABLET | Freq: Four times a day (QID) | ORAL | Status: DC | PRN

## 2013-05-21 MED ORDER — THIAMINE HCL 100 MG/ML IJ SOLN: 100.0000 mg | Freq: Every day | INTRAMUSCULAR | Status: DC

## 2013-05-21 MED ORDER — FERROUS FUMARATE 325 (106 FE) MG PO TABS
1.0000 | ORAL_TABLET | Freq: Two times a day (BID) | ORAL | Status: DC
  Filled 2013-05-21 (×3): qty 1

## 2013-05-21 MED ORDER — ACETAMINOPHEN 325 MG PO TABS
650.0000 mg | ORAL_TABLET | Freq: Four times a day (QID) | ORAL | Status: DC | PRN
  Administered 2013-05-21: 650 mg via ORAL
  Filled 2013-05-21: qty 2

## 2013-05-21 MED ORDER — LORAZEPAM 1 MG PO TABS: 0.0000 mg | ORAL_TABLET | Freq: Four times a day (QID) | ORAL | Status: DC

## 2013-05-21 MED ORDER — VITAMIN B-1 100 MG PO TABS
100.0000 mg | ORAL_TABLET | Freq: Every day | ORAL | Status: DC
Start: 2013-05-21 — End: 2013-05-22
  Administered 2013-05-21: 100 mg via ORAL
  Filled 2013-05-21: qty 1

## 2013-05-21 MED ORDER — CLONIDINE HCL 0.1 MG/24HR TD PTWK: 0.1000 mg | MEDICATED_PATCH | TRANSDERMAL | Status: DC

## 2013-05-21 MED ORDER — THIAMINE HCL 100 MG/ML IJ SOLN
Freq: Once | INTRAVENOUS | Status: AC
  Administered 2013-05-21: 17:00:00 via INTRAVENOUS
  Filled 2013-05-21: qty 1000

## 2013-05-21 MED ORDER — ADULT MULTIVITAMIN W/MINERALS CH
1.0000 | ORAL_TABLET | Freq: Every day | ORAL | Status: DC
  Administered 2013-05-21: 1 via ORAL
  Filled 2013-05-21: qty 1

## 2013-05-21 MED ORDER — PANTOPRAZOLE SODIUM 40 MG PO TBEC
40.0000 mg | DELAYED_RELEASE_TABLET | Freq: Every day | ORAL | Status: DC
  Administered 2013-05-21: 40 mg via ORAL
  Filled 2013-05-21: qty 1

## 2013-05-21 MED ORDER — ONDANSETRON 4 MG PO TBDP: 8.0000 mg | ORAL_TABLET | Freq: Three times a day (TID) | ORAL | Status: DC | PRN

## 2013-05-21 MED ORDER — LORAZEPAM 1 MG PO TABS: 0.0000 mg | ORAL_TABLET | Freq: Two times a day (BID) | ORAL | Status: DC

## 2013-05-21 MED ORDER — FOLIC ACID 1 MG PO TABS
1.0000 mg | ORAL_TABLET | Freq: Every day | ORAL | Status: DC
  Administered 2013-05-21: 1 mg via ORAL
  Filled 2013-05-21: qty 1

## 2013-05-21 MED ORDER — LORAZEPAM 2 MG/ML IJ SOLN: 1.0000 mg | Freq: Four times a day (QID) | INTRAMUSCULAR | Status: DC | PRN

## 2013-05-21 NOTE — ED Notes (Signed)
Pt moved back to room 14. Given scrubs and red socks to change into.

## 2013-05-21 NOTE — ED Notes (Signed)
Pt requesting detox from [facility].  Also reports abd pain. Pt reports it stays down when he is drinking but is not able to keep any food down.  Pt reports he starts drinking when he wakes up in the morning.  Pt is calm and cooperative at this time.
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
## 2013-05-21 NOTE — ED Notes (Signed)
Pt's belongings include: White tennis shoes Light blue pants  Black belt White socks White shirt Blue boxers [employer]

## 2013-05-21 NOTE — ED Notes (Addendum)
Pt states he is here for detox. Pt says he has been drinking alcohol all of his life and is now wanting detox. Pt called Behavioral Health and they told him to come here for medical clearance. Pt denies SI/HI. Pt also c/o abdominal pain and nausea.

## 2013-05-22 ENCOUNTER — Inpatient Hospital Stay (HOSPITAL_COMMUNITY)
Admission: EM | Admit: 2013-05-22 | Discharge: 2013-05-24 | DRG: 897 | Disposition: A | Discharge status: Home / Self Care | Payer: Medicare Other | Source: Intra-hospital | Attending: Psychiatry | Admitting: Psychiatry

## 2013-05-22 ENCOUNTER — Encounter (HOSPITAL_COMMUNITY): Payer: Self-pay | Admitting: Behavioral Health

## 2013-05-22 DIAGNOSIS — I10 Essential (primary) hypertension: Secondary | ICD-10-CM | POA: Diagnosis present

## 2013-05-22 DIAGNOSIS — J45909 Unspecified asthma, uncomplicated: Secondary | ICD-10-CM | POA: Diagnosis present

## 2013-05-22 DIAGNOSIS — F1410 Cocaine abuse, uncomplicated: Secondary | ICD-10-CM

## 2013-05-22 DIAGNOSIS — F1994 Other psychoactive substance use, unspecified with psychoactive substance-induced mood disorder: Secondary | ICD-10-CM

## 2013-05-22 DIAGNOSIS — Z79899 Other long term (current) drug therapy: Secondary | ICD-10-CM

## 2013-05-22 DIAGNOSIS — F10230 Alcohol dependence with withdrawal, uncomplicated: Secondary | ICD-10-CM

## 2013-05-22 DIAGNOSIS — F1020 Alcohol dependence, uncomplicated: Principal | ICD-10-CM | POA: Diagnosis present

## 2013-05-22 MED ORDER — ALUM & MAG HYDROXIDE-SIMETH 200-200-20 MG/5ML PO SUSP: 30.0000 mL | ORAL | Status: DC | PRN

## 2013-05-22 MED ORDER — CHLORDIAZEPOXIDE HCL 25 MG PO CAPS: 25.0000 mg | ORAL_CAPSULE | ORAL | Status: DC

## 2013-05-22 MED ORDER — ENSURE COMPLETE PO LIQD
237.0000 mL | Freq: Two times a day (BID) | ORAL | Status: DC
  Administered 2013-05-22 – 2013-05-24 (×3): 237 mL via ORAL

## 2013-05-22 MED ORDER — CHLORDIAZEPOXIDE HCL 25 MG PO CAPS
25.0000 mg | ORAL_CAPSULE | Freq: Three times a day (TID) | ORAL | Status: DC
  Administered 2013-05-23 – 2013-05-24 (×2): 25 mg via ORAL
  Filled 2013-05-22: qty 1

## 2013-05-22 MED ORDER — CLONIDINE HCL 0.1 MG/24HR TD PTWK
0.1000 mg | MEDICATED_PATCH | TRANSDERMAL | Status: DC
  Administered 2013-05-23: 0.1 mg via TRANSDERMAL
  Filled 2013-05-22: qty 1

## 2013-05-22 MED ORDER — ACETAMINOPHEN 325 MG PO TABS
650.0000 mg | ORAL_TABLET | Freq: Four times a day (QID) | ORAL | Status: DC | PRN
  Administered 2013-05-22: 650 mg via ORAL

## 2013-05-22 MED ORDER — THIAMINE HCL 100 MG/ML IJ SOLN: 100.0000 mg | Freq: Once | INTRAMUSCULAR | Status: DC

## 2013-05-22 MED ORDER — CHLORDIAZEPOXIDE HCL 25 MG PO CAPS: 25.0000 mg | ORAL_CAPSULE | Freq: Four times a day (QID) | ORAL | Status: DC | PRN

## 2013-05-22 MED ORDER — CHLORDIAZEPOXIDE HCL 25 MG PO CAPS: 25.0000 mg | ORAL_CAPSULE | Freq: Every day | ORAL | Status: DC

## 2013-05-22 MED ORDER — MAGNESIUM HYDROXIDE 400 MG/5ML PO SUSP: 30.0000 mL | Freq: Every day | ORAL | Status: DC | PRN

## 2013-05-22 MED ORDER — LOPERAMIDE HCL 2 MG PO CAPS
2.0000 mg | ORAL_CAPSULE | ORAL | Status: DC | PRN
  Administered 2013-05-22: 4 mg via ORAL

## 2013-05-22 MED ORDER — ADULT MULTIVITAMIN W/MINERALS CH
1.0000 | ORAL_TABLET | Freq: Every day | ORAL | Status: DC
  Administered 2013-05-22 – 2013-05-24 (×3): 1 via ORAL
  Filled 2013-05-22 (×5): qty 1

## 2013-05-22 MED ORDER — VITAMIN B-1 100 MG PO TABS
100.0000 mg | ORAL_TABLET | Freq: Every day | ORAL | Status: DC
  Administered 2013-05-23 – 2013-05-24 (×2): 100 mg via ORAL
  Filled 2013-05-22 (×4): qty 1

## 2013-05-22 MED ORDER — CHLORDIAZEPOXIDE HCL 25 MG PO CAPS
50.0000 mg | ORAL_CAPSULE | Freq: Once | ORAL | Status: AC
  Administered 2013-05-22: 50 mg via ORAL
  Filled 2013-05-22: qty 2

## 2013-05-22 MED ORDER — ONDANSETRON 4 MG PO TBDP: 4.0000 mg | ORAL_TABLET | Freq: Four times a day (QID) | ORAL | Status: DC | PRN

## 2013-05-22 MED ORDER — CHLORDIAZEPOXIDE HCL 25 MG PO CAPS
25.0000 mg | ORAL_CAPSULE | Freq: Four times a day (QID) | ORAL | Status: AC
  Administered 2013-05-22 – 2013-05-23 (×5): 25 mg via ORAL
  Filled 2013-05-22 (×6): qty 1

## 2013-05-22 MED ORDER — HYDROXYZINE HCL 25 MG PO TABS: 25.0000 mg | ORAL_TABLET | Freq: Four times a day (QID) | ORAL | Status: DC | PRN

## 2013-05-22 MED ORDER — TRAZODONE HCL 50 MG PO TABS
50.0000 mg | ORAL_TABLET | Freq: Every evening | ORAL | Status: DC | PRN
  Administered 2013-05-22 – 2013-05-23 (×2): 50 mg via ORAL
  Filled 2013-05-22 (×2): qty 1
  Filled 2013-05-22: qty 8
  Filled 2013-05-22 (×4): qty 1
  Filled 2013-05-22: qty 8

## 2013-05-22 NOTE — [facility] Group Notes (Signed)
[facility] LCSW Aftercare Discharge Planning Group Note   05/22/2013 9:33 AM  Participation Quality:  DID NOT ATTEND    Smart, Chad Turner
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
## 2013-05-22 NOTE — ED Notes (Signed)
Pt is awake and alert, pleasant and cooperative. Denies HI or SI. Pt reports detox program with in the last six months.pt interacts well with staff and others. Will continue to monitor for safety. [email or web address]-RN
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
## 2013-05-22 NOTE — Tx Team (Signed)
Initial Interdisciplinary Treatment Plan  PATIENT STRENGTHS: (choose at least two) Active sense of humor Capable of independent living Communication skills Financial means General fund of knowledge Motivation for treatment/growth Religious Affiliation Supportive family/friends  PATIENT STRESSORS: Educational concerns Substance abuse   PROBLEM LIST: Problem List/Patient Goals Date to be addressed Date deferred Reason deferred Estimated date of resolution  "I am here strictly for detox"                                                       DISCHARGE CRITERIA:  Ability to meet basic life and health needs Improved stabilization in mood, thinking, and/or behavior Reduction of life-threatening or endangering symptoms to within safe limits Withdrawal symptoms are absent or subacute and managed without 24-hour nursing intervention  PRELIMINARY DISCHARGE PLAN: Attend aftercare/continuing care group Attend 12-step recovery group Return to previous living arrangement  PATIENT/FAMIILY INVOLVEMENT: This treatment plan has been presented to and reviewed with the patient, Chad Turner.  The patient and family have been given the opportunity to ask questions and make suggestions.  Angeline Slim M 05/22/2013, 4:04 AM

## 2013-05-22 NOTE — Progress Notes (Signed)
Adult Psychoeducational Group Note  Date:  05/22/2013 Time:  9:26 PM  Group Topic/Focus:  NA group  Participation Level:  Active  Participation Quality:  Appropriate  Affect:  Appropriate  Cognitive:  Alert  Insight: Appropriate  Engagement in Group:  Engaged  Modes of Intervention:  Discussion  Additional Comments:    Flonnie Hailstone 05/22/2013, 9:26 PM

## 2013-05-22 NOTE — Progress Notes (Signed)
Patient ID: Chad Turner, male   DOB: [birth year], 52 y.o.   MRN: [id number] D: pt. Visible on the milieu walking the hall, "feeling sluggish today", denies diarrhea since last Imodium earlier today. A: Writer introduced self to client and discussed symptoms. Staff will monitor q33min for safety. Writer encouraged group. R: Pt. Is safe on the unit. Pt. Attended group.

## 2013-05-22 NOTE — Progress Notes (Signed)
Nutrition Brief Note  Patient identified on the Malnutrition Screening Tool (MST) Report  Body mass index is 26.42 kg/(m^2). Patient meets criteria for overweight based on current BMI.  Ht: 5'9'' Wt: 179 lbs Usual body weight: 210 lbs  Pt reports that he knows he has lost weight. He attributes his weight loss to replacement of food calories with ETOH. He reported that he has been attempting to eat, but that yesterday he vomited after eating. He says that he ate breakfast this morning and did ok with it. He would be most happy at 200 lbs. Pt decided that he felt well enough to attend group.  Nutrition Diagnosis: Inadequate oral intake related to ETOH abuse as evidenced by wt loss of 31 lbs.  Nutrition Intervention: - Ensure Complete po BID, each supplement provides 350 kcal and 13 grams of protein. - Continue to encourage po intake.  Current diet order is regular.Labs and medications reviewed.   No nutrition interventions warranted at this time. If nutrition issues arise, please consult RD.   Ebbie Latus RD, LDN

## 2013-05-22 NOTE — BHH Suicide Risk Assessment (Signed)
Suicide Risk Assessment  Admission Assessment     Nursing information obtained from:  Patient Demographic factors:  Male;Divorced or widowed;Living alone;Unemployed Current Mental Status:    Loss Factors:  Decrease in vocational status;Legal issues;Financial problems / change in socioeconomic status Historical Factors:  Family history of mental illness or substance abuse Risk Reduction Factors:  Positive social support;Positive therapeutic relationship;Religious beliefs about death;Sense of responsibility to family  CLINICAL FACTORS:   Alcohol/Substance Abuse/Dependencies  COGNITIVE FEATURES THAT CONTRIBUTE TO RISK:  Closed-mindedness Polarized thinking Thought constriction (tunnel vision)    SUICIDE RISK:   Moderate:  Frequent suicidal ideation with limited intensity, and duration, some specificity in terms of plans, no associated intent, good self-control, limited dysphoria/symptomatology, some risk factors present, and identifiable protective factors, including available and accessible social support.  PLAN OF CARE: Supportive approach/coping skills/relapse prevention                               Reassess and address the co morbidites  I certify that inpatient services furnished can reasonably be expected to improve the patient's condition.  Kynesha Guerin A 05/22/2013, 4:14 PM

## 2013-05-22 NOTE — BHH Group Notes (Signed)
BHH LCSW Group Therapy  05/22/2013 2:25 PM  Type of Therapy:  Group Therapy  Participation Level:  None  Participation Quality:  Drowsy  Affect:  Blunted  Cognitive:  Lacking  Insight:  Lacking  Engagement in Therapy:  None  Modes of Intervention:  Confrontation, Discussion, Education, Exploration, Socialization and Support  Summary of Progress/Problems: Emotion Regulation: This group focused on both positive and negative emotion identification and allowed group members to process ways to identify feelings, regulate negative emotions, and find healthy ways to manage internal/external emotions. Group members were asked to reflect on a time when their reaction to an emotion led to a negative outcome and explored how alternative responses using emotion regulation would have benefited them. Group members were also asked to discuss a time when emotion regulation was utilized when a negative emotion was experienced. Shyheem was drowsy with eyes clothes throughout group this afternoon. When asked how he felt by CSW, Christie stated that he felt horrible but wanted to sit in group. CSW thanked Lilly for attending group. Delphin did not participate in group, but attempted to stay awake in order to listen to others. He eventually fell asleep. At this time, Darron does not appear to be making progress in the group setting due to intensity of withdrawal symptoms, lack of participation due to drowsiness and discomfort from withdrawals.    Smart, Jafet Wissing 05/22/2013, 2:25 PM

## 2013-05-22 NOTE — Tx Team (Signed)
Interdisciplinary Treatment Plan Update (Adult)  Date: 05/22/2013   Time Reviewed: 10:07 AM  Progress in Treatment:  Attending groups: No. Participating in groups:  No. Taking medication as prescribed: Yes  Tolerating medication: Yes  Family/Significant othe contact made: No. SPE not required for pt.  Patient understands diagnosis: Yes, AEB seeking treatment for depression and ETOH detox.  Discussing patient identified problems/goals with staff: Yes  Medical problems stabilized or resolved: Yes  Denies suicidal/homicidal ideation: Yes during admission and self report.  Patient has not harmed self or Others: Yes  New problem(s) identified: n/a  Discharge Plan or Barriers: Pt not currently attending group. CSW will continue to explore aftercare options with pt. Pt has Medicare/Medicaid.  Additional comments: TAOS TAPP is an 52 y.o. male, single, African-American who presents to [facility] unaccompanied requesting treatment for alcohol dependence. Pt has a history of alcohol dependence and reports he has been drinking daily for approximately three months. He states he relapsed because he was under stress due to legal problems which have since been resolved. He reports seeking treatment at this time because he was having abdominal pain and difficulty with nausea and vomiting, stating "I was just laying around the house and couldn't keep food down so I knew it was time to quit." He reports he has been drinking approximately 6 quarts of beer daily for the past 3 months. He reports withdrawal symptoms when he tries to stop including nausea, vomiting, tremors and increased blood pressure. He denies history of blackouts or seizures. He denies abusing any other substances. He reports depressive symptoms related to alcohol dependence including poor sleep, poor appetite, social withdrawal and feelings or guilt and hopelessness. He denies any suicidal ideation and denies any history of suicide attempts. He denies  homicidal ideation or a history of violence. He denies any psychotic symptoms.  Reason for Continuation of Hospitalization: Librium taper-withdrawals Mood stabilization Medication management Estimated length of stay: 2-3 days  For review of initial/current patient goals, please see plan of care.  Attendees:  Patient:    Family:    Physician: Geoffery Lyons MD 05/22/2013 10:06 AM   Nursing: Lupita Leash RN 05/22/2013 10:07 AM   Clinical Social Worker Lynnex Fulp Smart, LCSWA  05/22/2013 10:07 AM   Other: Maureen Ralphs RN 05/22/2013 10:07 AM   Other: Aggie PA 05/22/2013 10:07 AM   Other: Massie Kluver, Community Care Coordinator  05/22/2013 10:07 AM   Other: Philippa Chester RN 05/22/2013 10:07 AM   Scribe for Treatment Team:  [employer] LCSWA 05/22/2013 10:07 AM

## 2013-05-22 NOTE — Progress Notes (Signed)
Patient ID: Chad Turner, male   DOB: 02-01-1961, 52 y.o.   MRN: [id number] Pt did not attend group. He was asleep in his bed.

## 2013-05-22 NOTE — ED Notes (Signed)
Pt is awake and alert, pleasant and cooperative. Patient denies HI, SI AH or VH. Discharge vitals 154/95 HR 84 RR 20 and unlabored. Pt has is transfer to [facility] inpatient treatment. Will continue to monitor for safety. Report given to [facility] at 0216  Patient escorted by [facility] lobby without incident. T.Melvyn Neth RN

## 2013-05-23 DIAGNOSIS — F329 Major depressive disorder, single episode, unspecified: Secondary | ICD-10-CM

## 2013-05-23 NOTE — Progress Notes (Signed)
Recreation Therapy Notes  Date: 07.24.2014 Time: 3:00pm Location: [address] Dayroom  Group Topic: Leisure Education  Goal Area(s) Addresses:  Patient will have increased knowledge of community resources. Patient will verbalize the ability to use positive leisure/recreation as a coping mechanism. Patient will verbalized a benefit of implementing positive leisure/recreation into daily life.   Behavioral Response: Resistant, Appropriate, Attentive, Engaged  Intervention: Art, Self-Expression  Activity: Match box of Leisure. Patients were given small white boxes similar in construction to [employer]. Patients were instructed to decorate the outside to represent themselves and used magazine clippings to represent three activities of choice that can be used as coping mechanisms. Patients were given boxes, magazines, glue, scissors, color pencils and crayons to complete activity.   Education:  Discharge Planning, Pharmacologist, Leisure Education   Education Outcome: Acknowledges understanding  Clinical Observations: Patient was initially resistant to participating in group activity. Patient observes peers and participated in group discussion for approximately five minutes before he changed his mind about participating in group activity. Patient verbalized knowledge of community resources such as the [employer], parks, Museum/gallery exhibitions officer, and [employer] Patient verbalized he has an understanding of role of individual organization. Patient completed activity, but chose not to share. Patient related to peers stories and shared that he likes to go [place] the drag [place] during his down time, but this is difficult for him because alcohol is readily available.    Marykay Lex Elvis Laufer, LRT/CTRS  Eastyn Dattilo L 05/23/2013 4:55 PM

## 2013-05-23 NOTE — BHH Group Notes (Signed)
[facility] LCSW Group Therapy  05/23/2013 2:34 PM  Type of Therapy:  Group Therapy  Participation Level:  Active  Participation Quality:  Attentive  Affect:  Appropriate  Cognitive:  Alert  Insight:  Improving  Engagement in Therapy:  Improving  Modes of Intervention:  Confrontation, Discussion, Education, Exploration, Socialization and Support  Summary of Progress/Problems:  Finding Balance in Life. Today's group focused on defining balance in one's own words, identifying things that can knock one off balance, and exploring healthy ways to maintain balance in life. Group members were asked to provide an example of a time when they felt off balance, describe how they handled that situation,and process healthier ways to regain balance in the future. Group members were asked to share the most important tool for maintaining balance that they learned while at [facility] and how they plan to apply this method after discharge. Jomes stated that he felt off balance prior to coming to [facility] because of his inability to hold food down and decreased appetite. 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 Locklan appears to be making progress in the group setting AEB his ability to identify triggers (feeling alone; depression; boredom) and identify ways to stay sober--[facility] IOP/medication/applying coping skills to daily life. Craven commented that "just being here a few days, I have my appetite and ability to taste and enjoy food again." "I feel motivated, more than ever, to stay clean."      Smart, Audelia Knape 05/23/2013, 2:34 PM

## 2013-05-23 NOTE — Progress Notes (Signed)
[facility] MD Progress Note  05/23/2013 1:18 PM Chad Turner  MRN:  [id number] Subjective:  Chad Turner feels more clear headed today. He states that he allowed his drinking and his cocaine use to get out of control. He could not get himself out of it. Once detox, he is not sure which way to go, he is afraid of relapsing as he afraid of the possible consequences. Every time he relapses is worst. He was not eating, taking care of himself   Diagnosis:  Alcohol Dependence/Cocaine Abuse/Depressive Disorder NOS  ADL's:  Intact  Sleep: Fair  Appetite:  Fair  Suicidal Ideation:  Plan:  denies Intent:  denies Means:  denies Homicidal Ideation:  Plan:  denies Intent:  denies Means:  denies AEB (as evidenced by):  Psychiatric Specialty Exam: Review of Systems  Constitutional: Negative.   Eyes: Negative.   Respiratory: Negative.   Cardiovascular: Negative.   Gastrointestinal: Negative.   Genitourinary: Negative.   Musculoskeletal: Negative.   Skin: Negative.   Neurological: Positive for headaches.  Endo/Heme/Allergies: Negative.   Psychiatric/Behavioral: Positive for depression and substance abuse. The patient is nervous/anxious.     Blood pressure 121/82, pulse 94, temperature 98.3 F (36.8 C), temperature source Oral, resp. rate 16, height 5\' 9"  (1.753 m), weight 81.194 kg (179 lb), SpO2 99.00%.Body mass index is 26.42 kg/(m^2).  General Appearance: Casual  Eye Contact::  Fair  Speech:  Clear and Coherent  Volume:  Decreased  Mood:  Anxious and worried  Affect:  Restricted  Thought Process:  Coherent and Goal Directed  Orientation:  Full (Time, Place, and Person)  Thought Content:  worries, concerns  Suicidal Thoughts:  No  Homicidal Thoughts:  No  Memory:  Immediate;   Fair Recent;   Fair Remote;   Fair  Judgement:  Fair  Insight:  Present  Psychomotor Activity:  Restlessness  Concentration:  Fair  Recall:  Fair  Akathisia:  No  Handed:  Right  AIMS (if indicated):      Assets:  Desire for Improvement  Sleep:  Number of Hours: 6.5   Current Medications: Current Facility-Administered Medications  Medication Dose Route Frequency Provider Last Rate Last Dose  . acetaminophen (TYLENOL) tablet 650 mg  650 mg Oral Q6H PRN Kerry Hough, PA-C   650 mg at 05/22/13 7829  . alum & mag hydroxide-simeth (MAALOX/MYLANTA) 200-200-20 MG/5ML suspension 30 mL  30 mL Oral Q4H PRN Kerry Hough, PA-C      . chlordiazePOXIDE (LIBRIUM) capsule 25 mg  25 mg Oral Q6H PRN Kerry Hough, PA-C      . chlordiazePOXIDE (LIBRIUM) capsule 25 mg  25 mg Oral QID Kerry Hough, PA-C   25 mg at 05/23/13 0805   Followed by  . chlordiazePOXIDE (LIBRIUM) capsule 25 mg  25 mg Oral TID Kerry Hough, PA-C       Followed by  . [START ON 05/24/2013] chlordiazePOXIDE (LIBRIUM) capsule 25 mg  25 mg Oral BH-qamhs Spencer E Simon, PA-C       Followed by  . [START ON 05/26/2013] chlordiazePOXIDE (LIBRIUM) capsule 25 mg  25 mg Oral Daily Kerry Hough, PA-C      . cloNIDine (CATAPRES - Dosed in mg/24 hr) patch 0.1 mg  0.1 mg Transdermal Weekly Kerry Hough, PA-C   0.1 mg at 05/23/13 0805  . feeding supplement (ENSURE COMPLETE) liquid 237 mL  237 mL Oral BID BM Earna Coder, RD   237 mL at 05/22/13 1937  .  hydrOXYzine (ATARAX/VISTARIL) tablet 25 mg  25 mg Oral Q6H PRN Kerry Hough, PA-C      . loperamide (IMODIUM) capsule 2-4 mg  2-4 mg Oral PRN Kerry Hough, PA-C   4 mg at 05/22/13 1712  . magnesium hydroxide (MILK OF MAGNESIA) suspension 30 mL  30 mL Oral Daily PRN Kerry Hough, PA-C      . multivitamin with minerals tablet 1 tablet  1 tablet Oral Daily Kerry Hough, PA-C   1 tablet at 05/23/13 0805  . ondansetron (ZOFRAN-ODT) disintegrating tablet 4 mg  4 mg Oral Q6H PRN Kerry Hough, PA-C      . thiamine (B-1) injection 100 mg  100 mg Intramuscular Once [employer], PA-C      . thiamine (VITAMIN B-1) tablet 100 mg  100 mg Oral Daily Kerry Hough, PA-C   100 mg  at 05/23/13 0805  . traZODone (DESYREL) tablet 50 mg  50 mg Oral QHS,MR X 1 Kerry Hough, PA-C   50 mg at 05/22/13 2154    Lab Results:  Results for orders placed during the hospital encounter of 05/21/13 (from the past 48 hour(s))  ACETAMINOPHEN LEVEL     Status: None   Collection Time    05/21/13  3:54 PM      Result Value Range   Acetaminophen (Tylenol), Serum <15.0  10 - 30 ug/mL   Comment:            THERAPEUTIC CONCENTRATIONS VARY     SIGNIFICANTLY. A RANGE OF 10-30     ug/mL MAY BE AN EFFECTIVE     CONCENTRATION FOR MANY PATIENTS.     HOWEVER, SOME ARE BEST TREATED     AT CONCENTRATIONS OUTSIDE THIS     RANGE.     ACETAMINOPHEN CONCENTRATIONS     >150 ug/mL AT 4 HOURS AFTER     INGESTION AND >50 ug/mL AT 12     HOURS AFTER INGESTION ARE     OFTEN ASSOCIATED WITH TOXIC     REACTIONS.  CBC     Status: None   Collection Time    05/21/13  3:54 PM      Result Value Range   WBC 4.5  4.0 - 10.5 K/uL   RBC 4.43  4.22 - 5.81 MIL/uL   Hemoglobin 13.5  13.0 - 17.0 g/dL   HCT 16.1  09.6 - 04.5 %   MCV 89.6  78.0 - 100.0 fL   MCH 30.5  26.0 - 34.0 pg   MCHC 34.0  30.0 - 36.0 g/dL   RDW 40.9  81.1 - 91.4 %   Platelets 240  150 - 400 K/uL  COMPREHENSIVE METABOLIC PANEL     Status: Abnormal   Collection Time    05/21/13  3:54 PM      Result Value Range   Sodium 126 (*) 135 - 145 mEq/L   Potassium 4.1  3.5 - 5.1 mEq/L   Comment: SLIGHT HEMOLYSIS     HEMOLYSIS AT THIS LEVEL MAY AFFECT RESULT   Chloride 91 (*) 96 - 112 mEq/L   CO2 20  19 - 32 mEq/L   Glucose, Bld 101 (*) 70 - 99 mg/dL   BUN 5 (*) 6 - 23 mg/dL   Creatinine, Ser 7.82  0.50 - 1.35 mg/dL   Calcium 9.0  8.4 - 95.6 mg/dL   Total Protein 7.8  6.0 - 8.3 g/dL   Albumin 3.4 (*) 3.5 - 5.2 g/dL  AST 250 (*) 0 - 37 U/L   ALT 124 (*) 0 - 53 U/L   Alkaline Phosphatase 143 (*) 39 - 117 U/L   Total Bilirubin 0.7  0.3 - 1.2 mg/dL   GFR calc non Af Amer >90  >90 mL/min   GFR calc Af Amer >90  >90 mL/min   Comment:             The eGFR has been calculated     using the CKD EPI equation.     This calculation has not been     validated in all clinical     situations.     eGFR's persistently     <90 mL/min signify     possible Chronic Kidney Disease.  ETHANOL     Status: Abnormal   Collection Time    05/21/13  3:54 PM      Result Value Range   Alcohol, Ethyl (B) 61 (*) 0 - 11 mg/dL   Comment:            LOWEST DETECTABLE LIMIT FOR     SERUM ALCOHOL IS 11 mg/dL     FOR MEDICAL PURPOSES ONLY  SALICYLATE LEVEL     Status: Abnormal   Collection Time    05/21/13  3:54 PM      Result Value Range   Salicylate Lvl <2.0 (*) 2.8 - 20.0 mg/dL  LIPASE, BLOOD     Status: None   Collection Time    05/21/13  3:54 PM      Result Value Range   Lipase 55  11 - 59 U/L  URINE RAPID DRUG SCREEN (HOSP PERFORMED)     Status: Abnormal   Collection Time    05/21/13  6:57 PM      Result Value Range   Opiates NONE DETECTED  NONE DETECTED   Cocaine POSITIVE (*) NONE DETECTED   Benzodiazepines NONE DETECTED  NONE DETECTED   Amphetamines NONE DETECTED  NONE DETECTED   Tetrahydrocannabinol NONE DETECTED  NONE DETECTED   Barbiturates NONE DETECTED  NONE DETECTED   Comment:            DRUG SCREEN FOR MEDICAL PURPOSES     ONLY.  IF CONFIRMATION IS NEEDED     FOR ANY PURPOSE, NOTIFY LAB     WITHIN 5 DAYS.                LOWEST DETECTABLE LIMITS     FOR URINE DRUG SCREEN     Drug Class       Cutoff (ng/mL)     Amphetamine      1000     Barbiturate      200     Benzodiazepine   200     Tricyclics       300     Opiates          300     Cocaine          300     THC              50  POCT I-STAT, CHEM 8     Status: Abnormal   Collection Time    05/21/13  7:08 PM      Result Value Range   Sodium 131 (*) 135 - 145 mEq/L   Potassium 4.7  3.5 - 5.1 mEq/L   Chloride 101  96 - 112 mEq/L   BUN 4 (*) 6 - 23 mg/dL  Creatinine, Ser 0.90  0.50 - 1.35 mg/dL   Glucose, Bld 91  70 - 99 mg/dL   Calcium, Ion 1.61 (*) 1.12 -  1.23 mmol/L   TCO2 23  0 - 100 mmol/L   Hemoglobin 13.6  13.0 - 17.0 g/dL   HCT 09.6  04.5 - 40.9 %    Physical Findings: AIMS: Facial and Oral Movements Muscles of Facial Expression: None, normal Lips and Perioral Area: None, normal Jaw: None, normal Tongue: None, normal,Extremity Movements Upper (arms, wrists, hands, fingers): None, normal Lower (legs, knees, ankles, toes): None, normal, Trunk Movements Neck, shoulders, hips: None, normal, Overall Severity Severity of abnormal movements (highest score from questions above): None, normal Incapacitation due to abnormal movements: None, normal Patient's awareness of abnormal movements (rate only patient's report): No Awareness, Dental Status Current problems with teeth and/or dentures?: Yes Does patient usually wear dentures?: No  CIWA:  CIWA-Ar Total: 2 COWS:     Treatment Plan Summary: Daily contact with patient to assess and evaluate symptoms and progress in treatment Medication management  Plan: Supportive approach/coping skills/relapse prevention           Pursue the detox protocol (BP/Pulse: High)           Continue to stabilize mood              Medical Decision Making Problem Points:  Review of last therapy session (1) and Review of psycho-social stressors (1) Data Points:  Review of medication regiment & side effects (2)  I certify that inpatient services furnished can reasonably be expected to improve the patient's condition.   Yong Wahlquist A 05/23/2013, 1:18 PM

## 2013-05-23 NOTE — Progress Notes (Signed)
The focus of this group is to educate the patient on the purpose and policies of crisis stabilization and provide a format to answer questions about their admission.  The group details unit policies and expectations of patients while admitted.  Patient participated in the discussion.  He was respectful and supportive of others.

## 2013-05-23 NOTE — Progress Notes (Signed)
Patient did attend the evening karaoke group. Pt was engaged and supportive.   

## 2013-05-23 NOTE — Progress Notes (Signed)
D:  Patient up and visible in the milieu most of the shift.  Has attended and participated in the groups.  He admitted this morning that he is not able to read really well and would appreciate assistance with reading the daily material that is handed out.  Denies suicidal thoughts at this time.  A:  Medications given as prescribed.  Spoke with patient and with the MHT on the hallway to get him some help in reading the packet.  Also encouraged patient to ask a tech or another patient to read the material to him.  Safety checks done every 15 minutes.  R:  Patient has been cooperative today.  Interacting well with staff and peers.  Agrees to having help from a tech to read the material and expressed appreciation.  Safety is maintained.

## 2013-05-23 NOTE — Progress Notes (Signed)
Adult Psychoeducational Group Note  Date:  05/23/2013 Time:  2:09 PM  Group Topic/Focus:  Overcoming Stress:   The focus of this group is to define stress and help patients assess their triggers.  Participation Level:  Active  Participation Quality:  Appropriate and Attentive  Affect:  Appropriate  Cognitive:  Alert and Appropriate  Insight: Good and Improving  Engagement in Group:  Engaged  Modes of Intervention:  Discussion, Education, Problem-solving and Support  Additional Comments:  Pt was able to identify stressors in his life and admitted to drinking when stressed. Affect bright and interacting with group members. Pt admitted to feeling stressed when asked to read and called upon to answer questions he doesn't know. Doristine Mango, Gershon Mussel 05/23/2013, 2:09 PM

## 2013-05-23 NOTE — [facility] Suicide Risk Assessment (Signed)
[facility] INPATIENT: Family/Significant Other Suicide Prevention Education  Suicide Prevention Education:  Education Completed; No one has been identified by the patient as the family member/significant other with whom the patient will be residing, and identified as the person(s) who will aid the patient in the event of a mental health crisis (suicidal ideations/suicide attempt).   Pt did not c/o SI at admission, nor have they endorsed SI during their stay here. SPE not required. SPI pamphlet given to pt and he was encouraged to ask questions and voice any concerns relating to SPE.   [employer], LCSWA 05/23/2013 8:23 AM

## 2013-05-24 ENCOUNTER — Ambulatory Visit: Payer: Self-pay

## 2013-05-24 MED ORDER — TRAZODONE HCL 50 MG PO TABS: 50.0000 mg | ORAL_TABLET | Freq: Every evening | ORAL | Status: DC | PRN

## 2013-05-24 MED ORDER — CLONIDINE HCL 0.1 MG/24HR TD PTWK: 1.0000 | MEDICATED_PATCH | TRANSDERMAL | Status: DC

## 2013-05-24 NOTE — Progress Notes (Signed)
[facility] Adult Case Management Discharge Plan :  Will you be returning to the same living situation after discharge: Yes,  home until [facility] admission At discharge, do you have transportation home?:Yes,  mother Do you have the ability to pay for your medications:Yes,  medicare/medicaid  Release of information consent forms completed and in the chart;  Patient's signature needed at discharge.  Patient to Follow up at: Follow-up Information   Follow up with [facility]. (Walk in Monday through Friday between 8AM-9AM)    Contact information:   [address], [place] [address] phone: [phone] fax: [phone]      Follow up with [facility]. (Call office on day of discharge to schedule Zenaida Niece to pick you up and take you to facility for assessment. (SAIOP and therapy))    Contact information:   [address] [place], [place] [address] phone: [phone] fax: [phone]      Follow up with [facility] On 05/30/2013. (Arrive by 8am with 30 day medication supply, ID, and clothing.)    Contact information:   [address] [place], [place] [address] phone: [phone] fax: [phone]      Patient denies SI/HI:   Yes,  during group/patient self report    Safety Planning and Suicide Prevention discussed:  Yes,  no spe required for this pt, as he did not endorse SI during admission or during stay at [facility]. SPI pamphlet provided to pt and he was encouraged to ask questions/voice concerns.  Smart, Chad Turner 05/24/2013, 10:27 AM

## 2013-05-24 NOTE — Progress Notes (Signed)
Patient ID: Chad Turner, male   DOB: [birth year], 52 y.o.   MRN: [id number] D: Pt. Visible on the floor in dayroom watching TV and on the phone. Pt. Reports "better day today, I ate today, I'm not craving as much" Pt. Reports that he's own disability and lives alone, he has two daughters and a grand daughter.  Pt. Reports that he is motivated to stop drinking. "my attitude changed and that's not me"  A: Writer encouraged pt. Consider a change in friends and habit, encouraged to get involved in AA and other positive activities with family. Data processing manager. Staff will monitor q52min for safety. R: pt. Is safe on the unit. Pt. Attended karaoke.

## 2013-05-24 NOTE — Progress Notes (Signed)
Adult Psychoeducational Group Note  Date:  05/24/2013 Time:  3:24 PM  Group Topic/Focus:  therapeutic activity  Participation Level:  Active  Participation Quality:  Appropriate and Attentive  Affect:  Appropriate and Excited  Cognitive:  Appropriate  Insight: Appropriate and Good  Engagement in Group:  Engaged  Modes of Intervention:  Activity    Dalia Heading 05/24/2013, 3:24 PM

## 2013-05-24 NOTE — Progress Notes (Signed)
Adult Psychoeducational Group Note  Date:  05/24/2013 Time:  3:25 PM  Group Topic/Focus:  Relapse Prevention Planning:   The focus of this group is to define relapse and discuss the need for planning to combat relapse.  Participation Level:  Active  Participation Quality:  Appropriate, Attentive, Sharing and Supportive  Affect:  Appropriate  Cognitive:  Alert and Appropriate  Insight: Appropriate and Good  Engagement in Group:  Engaged  Modes of Intervention:  Discussion, Education and Support    Dalia Heading 05/24/2013, 3:25 PM

## 2013-05-24 NOTE — Progress Notes (Signed)
D/C instructions/meds/follow-up appointments reviewed, pt verbalized understanding, pt's belongings returned to pt, samples given. 
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
## 2013-05-24 NOTE — [facility] Group Notes (Signed)
[facility] LCSW Aftercare Discharge Planning Group Note   05/24/2013 9:13 AM  Participation Quality:  Appropriate   Mood/Affect:  Appropriate  Depression Rating:  0  Anxiety Rating:  0  Thoughts of Suicide:  No Will you contract for safety?   NA  Current AVH:  No  Plan for Discharge/Comments:  Pt stated that he feels ready for d/c today. He will follow up at [facility] for med management and [facility] for [facility]. Pt requested [facility] date today. Msg left for French Ana at [facility].   Transportation Means: mother  Supports: mother/some family  [place], [place]

## 2013-05-29 NOTE — Progress Notes (Signed)
Patient Discharge Instructions:  After Visit Summary (AVS):   Faxed to:  05/30/13 Discharge Summary Note:   Faxed to:  05/30/13 Psychiatric Admission Assessment Note:   Faxed to:  05/30/13 Suicide Risk Assessment - Discharge Assessment:   Faxed to:  05/30/13 Faxed/Sent to the Next Level Care provider:  05/30/13 Faxed to [place] @ [phone] Faxed to [facility] @ [phone] Faxed to [facility] @ [place], 05/29/2013, 2:46 PM

## 2013-05-29 NOTE — Consult Note (Signed)
Agree with assessment and plan Kristel Durkee A. Martinique Pizzimenti, M.D. 

## 2013-08-03 ENCOUNTER — Encounter (HOSPITAL_COMMUNITY): Payer: Self-pay | Admitting: *Deleted

## 2013-08-03 ENCOUNTER — Emergency Department (HOSPITAL_COMMUNITY)
Admission: EM | Admit: 2013-08-03 | Discharge: 2013-08-04 | Disposition: A | Discharge status: Home / Self Care | Payer: Medicare Other | Source: Home / Self Care | Attending: Emergency Medicine | Admitting: Emergency Medicine

## 2013-08-03 DIAGNOSIS — F329 Major depressive disorder, single episode, unspecified: Secondary | ICD-10-CM | POA: Insufficient documentation

## 2013-08-03 DIAGNOSIS — F1020 Alcohol dependence, uncomplicated: Secondary | ICD-10-CM

## 2013-08-03 DIAGNOSIS — F10239 Alcohol dependence with withdrawal, unspecified: Secondary | ICD-10-CM

## 2013-08-03 DIAGNOSIS — Z87828 Personal history of other (healed) physical injury and trauma: Secondary | ICD-10-CM | POA: Insufficient documentation

## 2013-08-03 DIAGNOSIS — I10 Essential (primary) hypertension: Secondary | ICD-10-CM | POA: Insufficient documentation

## 2013-08-03 DIAGNOSIS — J45909 Unspecified asthma, uncomplicated: Secondary | ICD-10-CM | POA: Insufficient documentation

## 2013-08-03 DIAGNOSIS — Z9889 Other specified postprocedural states: Secondary | ICD-10-CM | POA: Insufficient documentation

## 2013-08-03 DIAGNOSIS — Z880 Allergy status to penicillin: Secondary | ICD-10-CM | POA: Insufficient documentation

## 2013-08-03 DIAGNOSIS — Z559 Problems related to education and literacy, unspecified: Secondary | ICD-10-CM | POA: Insufficient documentation

## 2013-08-03 DIAGNOSIS — F10939 Alcohol use, unspecified with withdrawal, unspecified: Secondary | ICD-10-CM | POA: Insufficient documentation

## 2013-08-03 DIAGNOSIS — F3289 Other specified depressive episodes: Secondary | ICD-10-CM | POA: Insufficient documentation

## 2013-08-03 LAB — ACETAMINOPHEN LEVEL: Acetaminophen (Tylenol), Serum: <15 ug/mL (ref 10–30)

## 2013-08-03 LAB — RAPID URINE DRUG SCREEN, HOSP PERFORMED
Amphetamines: NOT DETECTED
Barbiturates: NOT DETECTED
Benzodiazepines: NOT DETECTED
Cocaine: POSITIVE — AB
Opiates: NOT DETECTED
Tetrahydrocannabinol: NOT DETECTED

## 2013-08-03 LAB — CBC
HCT: 43.3 % (ref 39.0–52.0)
Hemoglobin: 15 g/dL (ref 13.0–17.0)
MCH: 30.2 pg (ref 26.0–34.0)
MCHC: 34.6 g/dL (ref 30.0–36.0)
MCV: 87.1 fL (ref 78.0–100.0)
Platelets: 304 10*3/uL (ref 150–400)
RBC: 4.97 MIL/uL (ref 4.22–5.81)
RDW: 13.2 % (ref 11.5–15.5)
WBC: 6.2 10*3/uL (ref 4.0–10.5)

## 2013-08-03 LAB — COMPREHENSIVE METABOLIC PANEL
ALT: 62 U/L — ABNORMAL HIGH (ref 0–53)
AST: 128 U/L — ABNORMAL HIGH (ref 0–37)
Albumin: 3.8 g/dL (ref 3.5–5.2)
Alkaline Phosphatase: 104 U/L (ref 39–117)
BUN: 4 mg/dL — ABNORMAL LOW (ref 6–23)
CO2: 21 mEq/L (ref 19–32)
Calcium: 9.6 mg/dL (ref 8.4–10.5)
Chloride: 96 mEq/L (ref 96–112)
Creatinine, Ser: 0.74 mg/dL (ref 0.50–1.35)
GFR calc Af Amer: >90 mL/min (ref >90)
GFR calc non Af Amer: >90 mL/min (ref >90)
Glucose, Bld: 127 mg/dL — ABNORMAL HIGH (ref 70–99)
Potassium: 3.9 mEq/L (ref 3.5–5.1)
Sodium: 133 mEq/L — ABNORMAL LOW (ref 135–145)
Total Bilirubin: 0.8 mg/dL (ref 0.3–1.2)
Total Protein: 8.5 g/dL — ABNORMAL HIGH (ref 6.0–8.3)

## 2013-08-03 LAB — ETHANOL: Alcohol, Ethyl (B): 111 mg/dL — ABNORMAL HIGH (ref 0–11)

## 2013-08-03 LAB — SALICYLATE LEVEL: Salicylate Lvl: <2 mg/dL — ABNORMAL LOW (ref 2.8–20.0)

## 2013-08-03 MED ORDER — TRAZODONE HCL 50 MG PO TABS
50.0000 mg | ORAL_TABLET | Freq: Every evening | ORAL | Status: DC | PRN
  Administered 2013-08-03 (×2): 50 mg via ORAL
  Filled 2013-08-03 (×2): qty 1

## 2013-08-03 MED ORDER — ZOLPIDEM TARTRATE 5 MG PO TABS: 5.0000 mg | ORAL_TABLET | Freq: Every evening | ORAL | Status: DC | PRN

## 2013-08-03 MED ORDER — CLONIDINE HCL 0.1 MG/24HR TD PTWK
0.1000 mg | MEDICATED_PATCH | TRANSDERMAL | Status: DC
  Administered 2013-08-03: 0.1 mg via TRANSDERMAL
  Filled 2013-08-03: qty 1

## 2013-08-03 MED ORDER — NICOTINE 21 MG/24HR TD PT24
21.0000 mg | MEDICATED_PATCH | Freq: Every day | TRANSDERMAL | Status: DC
  Filled 2013-08-03: qty 1

## 2013-08-03 MED ORDER — LORAZEPAM 1 MG PO TABS: 0.0000 mg | ORAL_TABLET | Freq: Two times a day (BID) | ORAL | Status: DC

## 2013-08-03 MED ORDER — OXYCODONE-ACETAMINOPHEN 5-325 MG PO TABS
1.0000 | ORAL_TABLET | Freq: Two times a day (BID) | ORAL | Status: DC
  Administered 2013-08-03: 1 via ORAL
  Filled 2013-08-03: qty 1

## 2013-08-03 MED ORDER — LORAZEPAM 1 MG PO TABS
0.0000 mg | ORAL_TABLET | Freq: Four times a day (QID) | ORAL | Status: DC
  Administered 2013-08-03 (×2): 1 mg via ORAL
  Filled 2013-08-03 (×2): qty 1

## 2013-08-03 MED ORDER — IBUPROFEN 200 MG PO TABS: 600.0000 mg | ORAL_TABLET | Freq: Three times a day (TID) | ORAL | Status: DC | PRN

## 2013-08-03 MED ORDER — ONDANSETRON HCL 4 MG PO TABS
4.0000 mg | ORAL_TABLET | Freq: Three times a day (TID) | ORAL | Status: DC | PRN
  Administered 2013-08-03: 4 mg via ORAL
  Filled 2013-08-03: qty 1

## 2013-08-03 NOTE — ED Notes (Signed)
Pt requesting ETOH detox. Was scene before and treated with outpt detox. Pt reports he wants to go to [facility], but they told him to come here first. Reports drinks all day. Reports used cocaine 2 days ago. Last ETOH this morning. Denies pain

## 2013-08-03 NOTE — ED Notes (Addendum)
Pt present to psych ED voluntarily wanting ETOH detox.  Pt deneis SI/AH/VH/HI.  Pt reports "feeling a little anxious"  Pt reports last drink was today.  Pt reports "loose count how much I drink, but i drink beer and liquor."
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
## 2013-08-03 NOTE — ED Notes (Signed)
Tele Assessment completed.

## 2013-08-04 ENCOUNTER — Encounter (HOSPITAL_COMMUNITY): Payer: Self-pay | Admitting: *Deleted

## 2013-08-04 ENCOUNTER — Inpatient Hospital Stay (HOSPITAL_COMMUNITY)
Admission: AD | Admit: 2013-08-04 | Discharge: 2013-08-12 | DRG: 897 | Disposition: A | Discharge status: Home / Self Care | Payer: Medicare Other | Attending: Psychiatry | Admitting: Psychiatry

## 2013-08-04 DIAGNOSIS — Z79899 Other long term (current) drug therapy: Secondary | ICD-10-CM

## 2013-08-04 DIAGNOSIS — J45909 Unspecified asthma, uncomplicated: Secondary | ICD-10-CM | POA: Diagnosis present

## 2013-08-04 DIAGNOSIS — F1020 Alcohol dependence, uncomplicated: Secondary | ICD-10-CM | POA: Diagnosis present

## 2013-08-04 DIAGNOSIS — I10 Essential (primary) hypertension: Secondary | ICD-10-CM | POA: Diagnosis present

## 2013-08-04 DIAGNOSIS — F1420 Cocaine dependence, uncomplicated: Secondary | ICD-10-CM

## 2013-08-04 DIAGNOSIS — F10239 Alcohol dependence with withdrawal, unspecified: Principal | ICD-10-CM | POA: Diagnosis present

## 2013-08-04 DIAGNOSIS — F1410 Cocaine abuse, uncomplicated: Secondary | ICD-10-CM

## 2013-08-04 DIAGNOSIS — F411 Generalized anxiety disorder: Secondary | ICD-10-CM | POA: Diagnosis present

## 2013-08-04 DIAGNOSIS — F10939 Alcohol use, unspecified with withdrawal, unspecified: Principal | ICD-10-CM | POA: Diagnosis present

## 2013-08-04 MED ORDER — CHLORDIAZEPOXIDE HCL 25 MG PO CAPS
25.0000 mg | ORAL_CAPSULE | Freq: Three times a day (TID) | ORAL | Status: AC
  Administered 2013-08-05 (×3): 25 mg via ORAL
  Filled 2013-08-04 (×3): qty 1

## 2013-08-04 MED ORDER — CHLORDIAZEPOXIDE HCL 25 MG PO CAPS
25.0000 mg | ORAL_CAPSULE | Freq: Four times a day (QID) | ORAL | Status: DC | PRN
  Administered 2013-08-04: 25 mg via ORAL
  Filled 2013-08-04: qty 1

## 2013-08-04 MED ORDER — CHLORDIAZEPOXIDE HCL 25 MG PO CAPS
25.0000 mg | ORAL_CAPSULE | ORAL | Status: AC
  Administered 2013-08-06 (×2): 25 mg via ORAL
  Filled 2013-08-04 (×2): qty 1

## 2013-08-04 MED ORDER — HYDROXYZINE HCL 25 MG PO TABS: 25.0000 mg | ORAL_TABLET | Freq: Four times a day (QID) | ORAL | Status: AC | PRN

## 2013-08-04 MED ORDER — ONDANSETRON 4 MG PO TBDP: 4.0000 mg | ORAL_TABLET | Freq: Four times a day (QID) | ORAL | Status: DC | PRN

## 2013-08-04 MED ORDER — ADULT MULTIVITAMIN W/MINERALS CH
1.0000 | ORAL_TABLET | Freq: Every day | ORAL | Status: DC
  Administered 2013-08-05 – 2013-08-12 (×8): 1 via ORAL
  Filled 2013-08-04 (×9): qty 1

## 2013-08-04 MED ORDER — CHLORDIAZEPOXIDE HCL 25 MG PO CAPS
25.0000 mg | ORAL_CAPSULE | Freq: Every day | ORAL | Status: AC
  Administered 2013-08-07: 25 mg via ORAL

## 2013-08-04 MED ORDER — LOPERAMIDE HCL 2 MG PO CAPS: 2.0000 mg | ORAL_CAPSULE | ORAL | Status: AC | PRN

## 2013-08-04 MED ORDER — ALUM & MAG HYDROXIDE-SIMETH 200-200-20 MG/5ML PO SUSP: 30.0000 mL | ORAL | Status: DC | PRN

## 2013-08-04 MED ORDER — VITAMIN B-1 100 MG PO TABS
100.0000 mg | ORAL_TABLET | Freq: Every day | ORAL | Status: DC
  Administered 2013-08-05 – 2013-08-12 (×8): 100 mg via ORAL
  Filled 2013-08-04 (×9): qty 1

## 2013-08-04 MED ORDER — CHLORDIAZEPOXIDE HCL 25 MG PO CAPS
25.0000 mg | ORAL_CAPSULE | Freq: Four times a day (QID) | ORAL | Status: AC
  Administered 2013-08-04 (×3): 25 mg via ORAL
  Filled 2013-08-04 (×3): qty 1

## 2013-08-04 MED ORDER — MAGNESIUM HYDROXIDE 400 MG/5ML PO SUSP: 30.0000 mL | Freq: Every day | ORAL | Status: DC | PRN

## 2013-08-04 MED ORDER — LOPERAMIDE HCL 2 MG PO CAPS: 2.0000 mg | ORAL_CAPSULE | ORAL | Status: DC | PRN

## 2013-08-04 MED ORDER — ONDANSETRON 4 MG PO TBDP: 4.0000 mg | ORAL_TABLET | Freq: Four times a day (QID) | ORAL | Status: AC | PRN

## 2013-08-04 MED ORDER — CHLORDIAZEPOXIDE HCL 25 MG PO CAPS
25.0000 mg | ORAL_CAPSULE | Freq: Four times a day (QID) | ORAL | Status: AC | PRN
  Administered 2013-08-05: 25 mg via ORAL
  Filled 2013-08-04 (×2): qty 1

## 2013-08-04 MED ORDER — THIAMINE HCL 100 MG/ML IJ SOLN: 100.0000 mg | Freq: Once | INTRAMUSCULAR | Status: DC

## 2013-08-04 MED ORDER — HYDROXYZINE HCL 25 MG PO TABS: 25.0000 mg | ORAL_TABLET | Freq: Four times a day (QID) | ORAL | Status: DC | PRN

## 2013-08-04 MED ORDER — VITAMIN B-1 100 MG PO TABS
100.0000 mg | ORAL_TABLET | Freq: Every day | ORAL | Status: DC
  Administered 2013-08-04: 100 mg via ORAL
  Filled 2013-08-04: qty 1

## 2013-08-04 MED ORDER — TRAZODONE HCL 50 MG PO TABS
50.0000 mg | ORAL_TABLET | Freq: Every evening | ORAL | Status: DC | PRN
  Administered 2013-08-04 – 2013-08-11 (×8): 50 mg via ORAL
  Filled 2013-08-04 (×21): qty 1

## 2013-08-04 MED ORDER — ACETAMINOPHEN 325 MG PO TABS
650.0000 mg | ORAL_TABLET | Freq: Four times a day (QID) | ORAL | Status: DC | PRN
  Administered 2013-08-05 – 2013-08-11 (×4): 650 mg via ORAL
  Filled 2013-08-04 (×4): qty 2

## 2013-08-04 MED ORDER — CLONIDINE HCL 0.1 MG/24HR TD PTWK
0.1000 mg | MEDICATED_PATCH | TRANSDERMAL | Status: DC
  Filled 2013-08-04: qty 1

## 2013-08-04 MED ORDER — TRAZODONE HCL 50 MG PO TABS: 50.0000 mg | ORAL_TABLET | Freq: Every evening | ORAL | Status: DC | PRN

## 2013-08-04 MED ORDER — ADULT MULTIVITAMIN W/MINERALS CH
1.0000 | ORAL_TABLET | Freq: Every day | ORAL | Status: DC
  Administered 2013-08-04: 1 via ORAL
  Filled 2013-08-04 (×2): qty 1

## 2013-08-04 NOTE — Progress Notes (Signed)
Pt vol admitted via [facility] for etoh dependence and detox. This is his 5th admit to [facility] in the last 18 months. He reports drinking 8-10 40oz beers daily and has been doing so "for years." Pt also admits to using crack cocaine which is confirmed by his UDS. CIWA is a 2 on admit however pt was medicated in ED prior to transfer. BP is elevated and pt has hx of HTN as well as asthma. Pt ambulates with slight limp and indicates he is in need of a full L knee replacement. Knee periodically gives out and though pt denies hx of falls, pt is made a moderate fall risk. Precautions in place and explained. Pt verbalizes understanding. Oriented to unit, meal offered but declined. Fluids given and encouraged. Pt denies any SI/HI/AVH and is safely resting in bed. Lawrence Marseilles
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
## 2013-08-04 NOTE — [facility] Group Notes (Signed)
[facility] Group Notes:  (Clinical Social Work)  08/04/2013  10:00-11:00AM  Summary of Progress/Problems:   The main focus of today's process group was to   identify the patient's current support system and decide on other supports that can be put in place.  The picture on workbook was used to discuss why additional supports are needed, and a hand-out was distributed with four definitions/levels of support, then used to talk about how patients have given and received all different kinds of support.  An emphasis was placed on using counselor, doctor, therapy groups, 12-step groups, and problem-specific support groups to expand supports.  The patient identified one additional support as being possibly going to a depression support group.  He has the support of his mother and sisters, and he has an Charity fundraiser from [place] that visits him at home and takes him to his appointments.  Type of Therapy:  Process Group with Motivational Interviewing  Participation Level:  Active  Participation Quality:  Drowsy and Sharing  Affect:  Blunted  Cognitive:  Oriented  Insight:  Developing/Improving  Engagement in Therapy:  Improving  Modes of Intervention:   Education, Support and Processing, Activity  Ambrose Mantle, LCSW 08/04/2013, 12:34 PM
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
## 2013-08-04 NOTE — Progress Notes (Signed)
D   Pt is depressed and sad   He has stayed in his room this morning  He reports minimal signs and symptoms of withdrawal   He is pleasant on approach and is cooperative   He denies suicidal and homicidal ideation   He denies auditory and visual hallucinations A   Verbal support given  Medications administered and effectiveness monitored   Q 15 min checks R   Pt safe at present

## 2013-08-04 NOTE — BH Assessment (Signed)
[facility] Assessment Progress Note   Patient has been accepted to [facility] by Serena Colonel, NP to the services of Dr. Dub Mikes.  Patient will go to room 307-1.  N[place] requested to make sure that patient understood that he would not have percocet while on the unit.  Nurse Roanna Epley was requested to ask patient if he had any problems with doing this.  Patient told Archie Patten that he had no problem with not having percocet while at [facility].  Dr. Jodi Mourning at [facility] was notified of the acceptance to [facility].

## 2013-08-04 NOTE — [facility] Counselor (Signed)
  Note entered in error Carney Bern, LCSW

## 2013-08-04 NOTE — [facility] Group Notes (Signed)
[facility] Group Notes:  (Nursing/MHT/Case Management/Adjunct)  Date:  08/04/2013  Time:  10:25 AM Type of Therapy: Psychoeducational Skills  Participation Level: Did Not Attend  Participation Quality: na  Affect: na  Cognitive: na  Insight: None  Engagement in Group: na  Modes of Intervention: na  Summary of Progress/Problems:   Malva Limes 08/04/2013, 10:25 AM

## 2013-08-04 NOTE — H&P (Signed)
Pt was interviewed by this provider. NP assessment was reviewed, and I concur with treatment plan.

## 2013-08-04 NOTE — Tx Team (Signed)
Initial Interdisciplinary Treatment Plan  PATIENT STRENGTHS: (choose at least two) Ability for insight Average or above average intelligence Capable of independent living Communication skills General fund of knowledge Supportive family/friends  PATIENT STRESSORS: Financial difficulties Substance abuse   PROBLEM LIST: Problem List/Patient Goals Date to be addressed Date deferred Reason deferred Estimated date of resolution  ETOH dependence/detox 08/04/13                                                      DISCHARGE CRITERIA:  Ability to meet basic life and health needs Motivation to continue treatment in a less acute level of care Reduction of life-threatening or endangering symptoms to within safe limits Withdrawal symptoms are absent or subacute and managed without 24-hour nursing intervention  PRELIMINARY DISCHARGE PLAN: Attend 12-step recovery group Outpatient therapy Return to previous living arrangement  PATIENT/FAMIILY INVOLVEMENT: This treatment plan has been presented to and reviewed with the patient, Chad Turner, and/or family member.  The patient and family have been given the opportunity to ask questions and make suggestions.  Lawrence Marseilles 08/04/2013, 3:55 AM

## 2013-08-04 NOTE — Progress Notes (Signed)
Psychoeducational Group Note  Date:  08/04/2013 Time:  0945 am  Group Topic/Focus:  Making Healthy Choices:   The focus of this group is to help patients identify negative/unhealthy choices they were using prior to admission and identify positive/healthier coping strategies to replace them upon discharge.  Participation Level:  Did Not Attend  Chad Turner J 08/04/2013, 10:29 AM 

## 2013-08-04 NOTE — Progress Notes (Signed)
Patient did attend the evening speaker AA meeting.  

## 2013-08-04 NOTE — [facility] Counselor (Signed)
Writer informed the patient that he has been accepted to [facility].  Writer obtained signatures on the support paperwork and faxed a copy to [facility].  Writer gave the originals to the nurse working with the patient.

## 2013-08-05 NOTE — Progress Notes (Signed)
Pt is quiet and somewhat withdrawn though engages 1:1. States he is having mild withdrawal - anxiety and feeling on edge. Otherwise denies complaints. BP remains slightly elevated though pt has hx of HTN. Pt given support and reassurance. He is pleasant and cooperative. Forwards little information but maintains eye contact. Denies any SI/HI/AVH and is safe. Lawrence Marseilles

## 2013-08-05 NOTE — ED Provider Notes (Signed)
Medical screening examination/treatment/procedure(s) were performed by non-physician practitioner and as supervising physician I was immediately available for consultation/collaboration.   Audree Camel, MD 08/05/13 1006

## 2013-08-05 NOTE — Progress Notes (Signed)
Adult Psychoeducational Group Note  Date:  08/05/2013 Time:  11:19 AM  Group Topic/Focus:  Wellness Toolbox:   The focus of this group is to discuss various aspects of wellness, balancing those aspects and exploring ways to increase the ability to experience wellness.  Patients will create a wellness toolbox for use upon discharge.  Participation Level:  Active  Participation Quality:  Appropriate and Attentive  Affect:  Appropriate  Cognitive:  Alert and Appropriate  Insight: Appropriate and Good  Engagement in Group:  Engaged  Modes of Intervention:  Discussion and Education  Additional Comments:  Pt was very engaged and active during group session. Pt discussed some of his goals which included environmental, emotional, physical, spiritual and intellectual goals.   Guilford Shi K 08/05/2013, 11:19 AM

## 2013-08-05 NOTE — Progress Notes (Signed)
Patient ID: Chad Turner, male   DOB: 1961/05/22, 52 y.o.   MRN: [id number] PER STATE REGULATIONS 482.30  THIS CHART WAS REVIEWED FOR MEDICAL NECESSITY WITH RESPECT TO THE PATIENT'S ADMISSION/ DURATION OF STAY.  NEXT REVIEW DATE: 08/08/2013  Willa Rough, RN, BSN CASE MANAGER

## 2013-08-05 NOTE — [facility] Group Notes (Signed)
[facility] LCSW Aftercare Discharge Planning Group Note   08/05/2013 8:45 AM  Participation Quality:  Alert and Appropriate   Mood/Affect:  Appropriate, Flat and Depressed  Depression Rating:  5  Anxiety Rating:  5  Thoughts of Suicide:  Pt denies SI/HI  Will you contract for safety?   Yes  Current AVH:  Pt denies  Plan for Discharge/Comments:  Pt attended discharge planning group and actively participated in group.  CSW provided pt with today's workbook.  Pt states that he came here for detox.  Pt states that since last admission he tried outpatient services with [facility].  Pt states that he regularly followed up there but feels he needs additional treatment at [facility].  CSW will secure pt's follow up at [facility] and [facility].  Pt lives in [place] and reports having transportation.  No further needs voiced by pt at this time.    Transportation Means: Pt reports access to transportation  Supports: No supports mentioned at this time  Chad Turner, LCSWA 08/05/2013 9:41 AM
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
## 2013-08-05 NOTE — [facility] Group Notes (Signed)
[facility] LCSW Group Therapy  08/05/2013  1:15 PM   Type of Therapy:  Group Therapy  Participation Level:  Active  Participation Quality:  Appropriate and Attentive  Affect:  Appropriate and Calm  Cognitive:  Alert and Appropriate  Insight:  Developing/Improving and Engaged  Engagement in Therapy:  Developing/Improving and Engaged  Modes of Intervention:  Clarification, Confrontation, Discussion, Education, Exploration, Limit-setting, Orientation, Problem-solving, Rapport Building, Dance movement psychotherapist, Socialization and Support  Summary of Progress/Problems: Pt identified obstacles faced currently and processed barriers involved in overcoming these obstacles. Pt identified steps necessary for overcoming these obstacles and explored motivation (internal and external) for facing these difficulties head on. Pt further identified one area of concern in their lives and chose a goal to focus on for today.  Pt shared that his biggest obstacle is overcoming his addiction.  Pt was able to share that since last admission he relapsed and continued to use.  Pt was able to identify that his lack of further treatment after last admission and stepping down to outpatient wasn't enough for him.  Pt expressed worry for his "partying habits" and the fact that it could affect his housing.  Pt states that he doesn't have much but feel she should protect what he does have.  Pt actively participated and was engaged in group discussion.    Reyes Ivan, [place] 08/05/2013 2:33 PM

## 2013-08-05 NOTE — Progress Notes (Addendum)
Patient ID: Chad Turner, male   DOB: 1961/02/09, 52 y.o.   MRN: [id number] D: Pt. In dayroom watching TV, reports he went back to his same routine after discharge. A: Writer introduced self to client and provided emotional support, encouraged pt. To move forward in recovery and consider long term treatment facility. Staff encouraged group. Staff will monitor q41min for safety. R: Pt. Is safe on the unit and attended group. Pt. Reports he is considering [facility].

## 2013-08-05 NOTE — Tx Team (Addendum)
Interdisciplinary Treatment Plan Update (Adult)  Date: 08/05/2013  Time Reviewed:  9:45 AM  Progress in Treatment: Attending groups: Yes Participating in groups:  Yes Taking medication as prescribed:  Yes Tolerating medication:  Yes Family/Significant othe contact made: No, n/a Patient understands diagnosis:  Yes Discussing patient identified problems/goals with staff:  Yes Medical problems stabilized or resolved:  Yes Denies suicidal/homicidal ideation: Yes Issues/concerns per patient self-inventory:  Yes Other:  New problem(s) identified: N/A  Discharge Plan or Barriers: CSW assessing for appropriate referrals.    Reason for Continuation of Hospitalization: Anxiety Depression Detox Medication Stabilization  Comments: N/A  Estimated length of stay: 3-5 days  For review of initial/current patient goals, please see plan of care.  Attendees: Patient:     Family:     Physician:  Dr. Daleen Bo 08/05/2013 11:14 AM   Nursing:   Laurell Josephs, RN 08/05/2013 11:14 AM   Clinical Social Worker:  Reyes Ivan, LCSWA 08/05/2013 11:14 AM   Other: Onnie Boer, RN case manager 08/05/2013 11:14 AM   Other:  Trula Slade, LCSWA 08/05/2013 11:14 AM   Other:   08/05/2013 11:14 AM   Other:     Other:    Other:    Other:    Other:    Other:    Other:     Scribe for Treatment Team:   Carmina Miller, 08/05/2013 , 11:14 AM

## 2013-08-05 NOTE — [facility] Suicide Risk Assessment (Signed)
[facility] INPATIENT: Family/Significant Other Suicide Prevention Education  Suicide Prevention Education:  Education Completed; No one has been identified by the patient as the family member/significant other with whom the patient will be residing, and identified as the person(s) who will aid the patient in the event of a mental health crisis (suicidal ideations/suicide attempt). With written consent from the patient, the family member/significant other has been provided the following suicide prevention education, prior to the and/or following the discharge of the patient.  The suicide prevention education provided includes the following:  Suicide risk factors  Suicide prevention and interventions  National Suicide Hotline telephone number  [facility] assessment telephone number  [facility] Emergency Assistance 911  [facility] and/or Residential Mobile Crisis Unit telephone number Request made of family/significant other to:  Remove weapons (e.g., guns, rifles, knives), all items previously/currently identified as safety concern.  Remove drugs/medications (over-the-counter, prescriptions, illicit drugs), all items previously/currently identified as a safety concern. The family member/significant other verbalizes understanding of the suicide prevention education information provided. The family member/significant other agrees to remove the items of safety concern listed above. Pt did not c/o SI at admission, nor have they endorsed SI during their stay here. SPE not required.  Reyes Ivan, LCSWA 08/05/2013  11:10 AM

## 2013-08-05 NOTE — Progress Notes (Signed)
D:Pt rates depression as a 5 on 1-10 scale with 10 being the most depressed. He rates his hopelessness as a 4. Pt had mild tremor this morning and blood pressure is WNL. A:Offered support, encouragement and 15 minute checks. Gave medications as ordered. R:Pt denies si and hi. Safety maintained on the unit.

## 2013-08-06 NOTE — Progress Notes (Signed)
The focus of this group is to educate the patient on the purpose and policies of crisis stabilization and provide a format to answer questions about their admission.  The group details unit policies and expectations of patients while admitted. Patient attended the group and was very insightful and engaging.
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
## 2013-08-06 NOTE — Progress Notes (Signed)
Recreation Therapy Notes  Date: 10.07.2014 Time: 3:00pm Location: [address] Dayroom  Group Topic: Communication, Team Building, Problem Solving  Goal Area(s) Addresses:  Patient will effectively work with peer towards shared goal.  Patient will identify skill used to make activity successful.  Patient will identify how skills used during activity can be used to reach post d/c goals.   Behavioral Response: Appropriate, Engaged in Activity, Disengaged from discussion    Intervention: Problem Solving Activitiy  Activity: Life Boat. Patients were given a scenario about being on a sinking yacht. Patients were informed the yacht included 15 guest, 8 of which could be placed on the life boat, along with all group members. Individuals on guest list were of varying socioeconomic classes such as a Education officer, museum, Materials engineer, Midwife, Tree surgeon.   Education: Customer service manager, Discharge Planning   Education Outcome: Acknowledges understanding  Clinical Observations/Feedback: Patient actively engaged in group activity, voicing his opinion and debating with peers appropriately. Patient made no contributions to group discussion and it is unclear if patient was listening as he covered his face with his hand.   Marykay Lex Khizar Fiorella, LRT/CTRS  Jearl Klinefelter 08/06/2013 4:38 PM

## 2013-08-06 NOTE — Progress Notes (Signed)
D: Patient in bed awake during this assessment. Mood and affect sad and depressed. He reported having mild tremors. Denied SI/HI and denied hallucinations.  A: Writer encouraged and supported patient. R: Patient receptive to encouragement and support. Q 15 minute check continues as scheduled to maintain safety.

## 2013-08-06 NOTE — [facility] Group Notes (Signed)
[facility] LCSW Group Therapy  08/06/2013  1:15 PM   Type of Therapy:  Group Therapy  Participation Level:  Active  Participation Quality:  Appropriate and Attentive  Affect:  Appropriate and Calm  Cognitive:  Alert and Appropriate  Insight:  Developing/Improving and Engaged  Engagement in Therapy:  Developing/Improving and Engaged  Modes of Intervention:  Activity, Clarification, Confrontation, Discussion, Education, Exploration, Limit-setting, Orientation, Problem-solving, Rapport Building, Dance movement psychotherapist, Socialization and Support  Summary of Progress/Problems: Patient was attentive and engaged with speaker from [employer].  Patient was attentive to speaker while they shared their story of dealing with mental health and overcoming it.  Patient expressed interest in their programs and services and received information on their agency.  Patient processed ways they can relate to the speaker.   Pt asked insightful questions and was able to relate to the speaker.  Pt actively participated and was engaged in group discussion.    Reyes Ivan, LCSWA 08/06/2013 2:19 PM

## 2013-08-06 NOTE — Progress Notes (Signed)
D:Pt rates his anxiety as a 5 on 1-10 scale with 10 being the most anxious. Pt is pleasant and cooperative. He reports that his cravings are worse in the morning when he gets up because he is used to going and getting his alcohol in the mornings. A:Offered support, encouragement and 15 minute checks. R:Pt denies si and hi. Safety maintained on the unit.

## 2013-08-06 NOTE — Progress Notes (Signed)
Adult Psychoeducational Group Note  Date:  08/06/2013 Time:  6:38 PM  Group Topic/Focus:  Recovery Goals:   The focus of this group is to identify appropriate goals for recovery and establish a plan to achieve them.  Participation Level:  Active  Participation Quality:  Appropriate and Attentive  Affect:  Appropriate  Cognitive:  Appropriate  Insight: Good  Engagement in Group:  Engaged  Modes of Intervention:  Discussion, Exploration, Socialization and Support  Additional Comments:  Pt came to group and shared that his children and negative people are standing between him and recovery. Pt plans on changing this by going to [facility] and an outpatient program and going to therapy.  Cathlean Cower 08/06/2013, 6:38 PM

## 2013-08-06 NOTE — Progress Notes (Signed)
Recreation Therapy Notes  Date: 10.07.2014 Time: 2:30pm Location: 300 Hall Dayroom  Group Topic: Software engineer Activities (AAA)  Behavioral Response: Did not attend. Patient consent indicates patient declines all AAA services during admission.    Marykay Lex Anissa Abbs, LRT/CTRS  Miana Politte L 08/06/2013 4:18 PM

## 2013-08-07 NOTE — BHH Group Notes (Signed)
[facility] LCSW Aftercare Discharge Planning Group Note   08/07/2013 8:45 AM  Participation Quality:  Alert and Appropriate   Mood/Affect:  Appropriate and Calm  Depression Rating:  5  Anxiety Rating:  5  Thoughts of Suicide:  Pt denies SI/HI  Will you contract for safety?   Yes  Current AVH:  Pt denies  Plan for Discharge/Comments:  Pt attended discharge planning group and actively participated in group.  CSW provided pt with today's workbook.  Pt reports doing okay today.  Pt is scheduled to go to [facility] on Tuesday for further treatment and [facility] for outpatient treatment.  Pt lives in [place] and reports having transportation.  No further needs voiced by pt at this time.    Transportation Means: Pt reports access to transportation  Supports: No supports mentioned at this time  Chad Turner, LCSWA 08/07/2013 9:45 AM

## 2013-08-07 NOTE — Tx Team (Signed)
Interdisciplinary Treatment Plan Update (Adult)  Date: 08/07/2013  Time Reviewed:  9:45 AM  Progress in Treatment: Attending groups: Yes Participating in groups:  Yes Taking medication as prescribed:  Yes Tolerating medication:  Yes Family/Significant othe contact made: No, N/A Patient understands diagnosis:  Yes Discussing patient identified problems/goals with staff:  Yes Medical problems stabilized or resolved:  Yes Denies suicidal/homicidal ideation: Yes Issues/concerns per patient self-inventory:  Yes Other:  New problem(s) identified: N/A  Discharge Plan or Barriers: Pt will follow up at [facility] for further inpatient treatment and [facility] for outpatient services.    Reason for Continuation of Hospitalization: Anxiety Depression Medication Stabilization  Comments: N/A  Estimated length of stay: 5 days, d/c on Tuesday  For review of initial/current patient goals, please see plan of care.   Attendees: Patient:     Family:     Physician:  Dr. Daleen Bo 08/07/2013 10:45 AM   Nursing:   Burnetta Sabin 08/07/2013 10:45 AM   Clinical Social Worker:  Reyes Ivan, LCSWA 08/07/2013 10:45 AM   Other: Onnie Boer, RN case manager 08/07/2013 10:45 AM   Other:  Trula Slade, LCSWA 08/07/2013 10:45 AM   Other:  Serena Colonel, NP 08/07/2013 10:45 AM   Other:  Berneice Heinrich, RN 08/07/2013 10:46 AM   Other:    Other:    Other:    Other:    Other:    Other:     Scribe for Treatment Team:   Carmina Miller, 08/07/2013 , 10:45 AM

## 2013-08-07 NOTE — Progress Notes (Signed)
Reviewed, agree with plan

## 2013-08-07 NOTE — Progress Notes (Signed)
  D) Patient pleasant and cooperative upon my assessment. Patient completed Patient Self Inventory, reports slept "fair," and  appetite is "improving." Patient rates depression as   5/10, patient rates hopeless feelings as  5/10. Patient denies SI/HI, denies A/V hallucinations. Patient denies symptoms of withdrawal at this time.   A) Patient offered support and encouragement, patient encouraged to discuss feelings/concerns with staff. Patient verbalized understanding. Patient monitored Q15 minutes for safety. Patient met with MD  to discuss today's goals and plan of care.  R) Patient visible in milieu, attending groups in day room and meals in dining room. Patient appropriate with staff and peers.   Patient taking medications as ordered. Will continue to monitor.

## 2013-08-07 NOTE — BHH Group Notes (Signed)
[facility] LCSW Group Therapy  08/07/2013  1:15 PM   Type of Therapy:  Group Therapy  Participation Level:  Active  Participation Quality:  Appropriate and Attentive  Affect:  Appropriate and Calm  Cognitive:  Alert and Appropriate  Insight:  Developing/Improving and Engaged  Engagement in Therapy:  Developing/Improving and Engaged  Modes of Intervention:  Clarification, Confrontation, Discussion, Education, Exploration, Limit-setting, Orientation, Problem-solving, Rapport Building, Dance movement psychotherapist, Socialization and Support  Summary of Progress/Problems: The topic for group today was emotional regulation.  This group focused on both positive and negative emotion identification and allowed group members to process ways to identify feelings, regulate negative emotions, and find healthy ways to manage internal/external emotions. Group members were asked to reflect on a time when their reaction to an emotion led to a negative outcome and explored how alternative responses using emotion regulation would have benefited them. Group members were also asked to discuss a time when emotion regulation was utilized when a negative emotion was experienced. Pt shared that he was partying too much and could see the consequences would be losing his house.  Pt shared how important what he has obtained is to him and not wanting his substance use to cause him to lose it.  Pt states that his family has tried to get him to get help the previous times but this time he wants to do it for himself.  Pt states that he plans to walk away when he is dealing with a negative emotion.  Pt actively participated and was engaged in group discussion.    Chad Turner, LCSWA 08/07/2013 2:32 PM

## 2013-08-07 NOTE — Progress Notes (Signed)
Adult Psychoeducational Group Note  Date:  08/07/2013 Time:  12:51 PM  Group Topic/Focus:  Emotional Education:   The focus of this group is to discuss what feelings/emotions are, and how they are experienced.  Participation Level:  Active  Participation Quality:  Appropriate, Sharing and Supportive  Affect:  Appropriate  Cognitive:  Appropriate  Insight: Appropriate  Engagement in Group:  Engaged  Modes of Intervention:  Education  Additional Comments:  Pts watched a video "The Sober Life- My Attitude". Pt where encouraged to discuss their feelings,opinions, and what they learned from watching the video.     Tora Perches N 08/07/2013, 12:51 PM
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
## 2013-08-07 NOTE — Progress Notes (Signed)
AA Group 

## 2013-08-08 MED ORDER — CLONIDINE HCL 0.1 MG/24HR TD PTWK
0.1000 mg | MEDICATED_PATCH | TRANSDERMAL | Status: DC
  Administered 2013-08-10: 0.1 mg via TRANSDERMAL
  Filled 2013-08-08: qty 1

## 2013-08-08 NOTE — Progress Notes (Signed)
D:  Patient up and active in the milieu.  Attending and participating in groups today.  Rates depression at a 7 and hopelessness at 5.  Denies suicidal ideation.  States his plan is to go from here to [facility].  States he was there years ago and was successful for over a year in his recovery.   A:  Medications given as prescribed.  Encouraged participation in all groups.   R:  Cooperative with staff.  Interacting well with peers.  Patient states the is looking forward to [facility] and is hoping for a successful recovery.
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
## 2013-08-08 NOTE — Progress Notes (Signed)
Adult Psychoeducational Group Note  Date:  08/08/2013 Time:  1:58 PM  Group Topic/Focus:  Building Self Esteem:   The Focus of this group is helping patients become aware of the effects of self-esteem on their lives, the things they and others do that enhance or undermine their self-esteem, seeing the relationship between their level of self-esteem and the choices they make and learning ways to enhance self-esteem.  Participation Level:  Active  Participation Quality:  Appropriate and Attentive  Affect:  Appropriate  Cognitive:  Alert and Appropriate  Insight: Good  Engagement in Group:  Engaged  Modes of Intervention:  Activity, Discussion, Exploration, Socialization and Support  Additional Comments:  Pt came to group and shared that drugs/alcohol and negative people are effecting his self-esteem. Pt plans on changing this by going to AA and therapy and changing this social circle to new people.   Cathlean Cower 08/08/2013, 1:58 PM

## 2013-08-08 NOTE — Progress Notes (Signed)
Recreation Therapy Notes  Date: 10.09.2014 Time: 2:30pm Location: 300 Hall Dayroom   Group Topic: Software engineer Activities (AAA)  Behavioral Response: Engaged, Appropriate  Affect: Euthymic  Clinical Observations/Feedback: Dog Team: [employer] Patient interacted appropriately with peer, dog team, LRT and MHT.   Marykay Lex Camile Esters, LRT/CTRS  Tasnim Balentine L 08/08/2013 4:30 PM

## 2013-08-08 NOTE — Progress Notes (Signed)
Patient ID: Chad Turner, male   DOB: January 11, 1961, 52 y.o.   MRN: [id number] PER STATE REGULATIONS 482.30  THIS CHART WAS REVIEWED FOR MEDICAL NECESSITY WITH RESPECT TO THE PATIENT'S ADMISSION/ DURATION OF STAY.  NEXT REVIEW DATE:  08/11/2013  Willa Rough, RN, BSN CASE MANAGER

## 2013-08-08 NOTE — Progress Notes (Addendum)
Recreation Therapy Notes  Date: 10.09.2014 Time: 3:00pm Location: 300 Hall Dayroom   Group Topic: Communication, Team Building, Problem Solving  Goal Area(s) Addresses:  Patient will effectively work with peer towards shared goal.  Patient will identify skill used to make activity successful.  Patient will identify how skills used during activity can be used to reach post d/c goals.   Behavioral Response: Engaged, Attentive, Appropriate  Intervention: Problem Solving Activity  Activity: Landing Pad. In teams patients were given 12 plastic drinking straws and a length of masking tape. Using the materials provided patients were asked to build a landing pad to catch a golf ball dropped from approximately 4 feet in the air.   Education: Discharge Planning  Education Outcome: Acknowledges understanding.   Clinical Observations/Feedback: Patient actively engaged in group activity. Patient shared ideas and opinions with team mates, as well as assisted with building team landing pad. Patient contributed to wrap up discussion, relating communication to going to meetings and telling his story post d/c.   Marykay Lex Dalylah Ramey, LRT/CTRS  Jearl Klinefelter 08/08/2013 4:51 PM

## 2013-08-08 NOTE — Progress Notes (Signed)
Pt is depressed in affect and mood. He identifies with no stressors that has led to his depressed mood this evening. Pt tells this Clinical research associate, "that is just one of those days". Pt was encouraged to become present within the milieu for the remainder of the evening. Pt attend group and was observed interacting approprietly with others.  A: Writer administered scheduled medications to pt. Continued support and availability as needed was extended to this pt. Staff continue to monitor pt with q12min checks.  R: No adverse drug reactions noted. Pt receptive to treatment. Pt remains safe at this time.

## 2013-08-08 NOTE — BHH Group Notes (Signed)
[facility] LCSW Group Therapy  08/08/2013  1:15 PM   Type of Therapy:  Group Therapy  Participation Level:  Active  Participation Quality:  Appropriate and Attentive  Affect:  Appropriate and Calm  Cognitive:  Alert and Appropriate  Insight:  Developing/Improving and Engaged  Engagement in Therapy:  Developing/Improving and Engaged  Modes of Intervention:  Activity, Clarification, Confrontation, Discussion, Education, Exploration, Limit-setting, Orientation, Problem-solving, Rapport Building, Dance movement psychotherapist, Socialization and Support  Summary of Progress/Problems: Patient was attentive and engaged with speaker from [employer].  Patient was attentive to speaker while they shared their story of dealing with mental health and overcoming it.  Patient expressed interest in their programs and services and received information on their agency.  Patient processed ways they can relate to the speaker.     Chrisie Jankovich Horton, LCSWA 08/08/2013 1:32 PM

## 2013-08-08 NOTE — Progress Notes (Signed)
The focus of this group is to educate the patient on the purpose and policies of crisis stabilization and provide a format to answer questions about their admission.  The group details unit policies and expectations of patients while admitted.  Patient attended 0900 nurse education orientation group this morning.  Patient actively participated, appropriate affect, alert, appropriate insight and engagement.  Patient will work on goals for discharge.

## 2013-08-09 MED ORDER — INFLUENZA VAC SPLIT QUAD 0.5 ML IM SUSP
0.5000 mL | INTRAMUSCULAR | Status: AC
  Administered 2013-08-10: 0.5 mL via INTRAMUSCULAR
  Filled 2013-08-09: qty 0.5

## 2013-08-09 NOTE — BHH Group Notes (Signed)
[facility] LCSW Aftercare Discharge Planning Group Note   08/09/2013 8:45 AM  Participation Quality:  Alert and Appropriate   Mood/Affect:  Appropriate and Bright  Depression Rating:  0  Anxiety Rating:  5  Thoughts of Suicide:  Pt denies SI/HI  Will you contract for safety?   Yes  Current AVH:  Pt denies  Plan for Discharge/Comments:  Pt attended discharge planning group and actively participated in group.  CSW provided pt with today's workbook.  Pt reports doing "great" today.  Pt is scheduled to go to [facility] on Tuesday for further treatment and [facility] for outpatient treatment.  Pt lives in [place] and reports having transportation to get to [facility]  No further needs voiced by pt at this time.    Transportation Means: Pt reports access to transportation  Supports: No supports mentioned at this time  Reyes Ivan, LCSWA 08/09/2013 9:55 AM

## 2013-08-09 NOTE — Progress Notes (Signed)
D: Pt mood is depressed but he brightens upon approach. Pt interacts well within the milieu and attended group tonight.  A: Support given. Verbalization encouraged. Pt encouraged to come to staff with any concerns.  R: Pt is receptive. No complaints of pain or discomfort. Will continue to monitor pt. Q15 min safety checks maintained.

## 2013-08-09 NOTE — Progress Notes (Signed)
D: Pt is appropriate in affect and mood. Pt reports having a good day today. Pt was up and active within the milieu. Pt is denying any detox symptoms. Pt is also denying any SI/HI/AVH. Pt is optimistic about the results he will receive from programming at [name]. He is  Pt attended group this evening. Pt observed interacting appropriately within the milieu.  A: Writer administered scheduled and prn medications to pt. Continued support and availability as needed was extended to this pt. Staff continue to monitor pt with q75min checks.  R: No adverse drug reactions noted. Pt receptive to treatment. Pt remains safe at this time.

## 2013-08-09 NOTE — Progress Notes (Signed)
Patient ID: Chad Turner, male   DOB: 07/29/1961, 52 y.o.   MRN: [id number] D. Patient presents with depressed mood, affect blunted. Patient states '' I'm doing fine this morning . '' Patient denies any acute concerns stating '' My only concern is when I'm being discharge, I'm supposed to be following up with [facility] '' Pt completed self inventory and rates depression at 5/10 on depression scale, 10 being worst depression 1 being least. Pt denies SI/HI. A. Medications given as ordered. Support and encouragement provided. R. Pt remains calm and cooperative, attending unit programming. Will continue to monitor q 15 minutes for safety.
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
## 2013-08-09 NOTE — [facility] Group Notes (Signed)
[facility] LCSW Group Therapy  08/09/2013  1:15 PM    Type of Therapy:  Group Therapy  Participation Level:  Active  Participation Quality:  Appropriate and Attentive  Affect:  Appropriate and Bright  Cognitive:  Alert and Appropriate  Insight:  Developing/Improving and Engaged  Engagement in Therapy:  Developing/Improving and Engaged  Modes of Intervention:  Clarification, Confrontation, Discussion, Education, Exploration, Limit-setting, Orientation, Problem-solving, Rapport Building, Dance movement psychotherapist, Socialization and Support  Summary of Progress/Problems: The topic for today was feelings about relapse.  Pt discussed what relapse prevention is to them and identified triggers that they are on the path to relapse.  Pt processed their feeling towards relapse and was able to relate to peers.  Pt discussed coping skills that can be used for relapse prevention.   Pt shared that he has shame around his relapse but is ready to change and stop using this time.  Pt explained that on last admissions he knew he would relapse but this time he chose to stay until he goes directly to [facility] because he knows he's not ready to be out in the negative environment, that influences him to use.  Pt actively participated and was engaged in group discussion.    Chad Turner, [place] 08/09/2013 2:33 PM

## 2013-08-09 NOTE — [facility] Group Notes (Signed)
Adult Psychoeducational Group Note  Date:  08/09/2013 Time:  10:32 PM  Group Topic/Focus:  AA Meeting  Participation Level:  Active  Participation Quality:  Appropriate  Affect:  Appropriate  Cognitive:  Appropriate  Insight: Appropriate  Engagement in Group:  Engaged  Modes of Intervention:  Discussion  Additional Comments:  Oney attended AA group.  Caroll Rancher A 08/09/2013, 10:32 PM

## 2013-08-09 NOTE — Progress Notes (Signed)
Adult Psychoeducational Group Note  Date:  08/09/2013 Time:  1:41 PM  Group Topic/Focus:  Relapse Prevention Planning:   The focus of this group is to define relapse and discuss the need for planning to combat relapse.  Participation Level:  Active  Participation Quality:  Sharing and Supportive  Affect:  Appropriate  Cognitive:  Appropriate  Insight: Appropriate  Engagement in Group:  Engaged  Modes of Intervention:  Discussion  Additional Comments:  Pts where encouraged to discuss what they could do if they felt themselves slipping back into old habits. Each pt was given a [facility] journal to write their goals down in.   Tora Perches N 08/09/2013, 1:41 PM

## 2013-08-09 NOTE — Tx Team (Signed)
Interdisciplinary Treatment Plan Update (Adult)  Date: 08/09/2013  Time Reviewed:  9:45 AM  Progress in Treatment: Attending groups: Yes Participating in groups:  Yes Taking medication as prescribed:  Yes Tolerating medication:  Yes Family/Significant othe contact made: No, N/A Patient understands diagnosis:  Yes Discussing patient identified problems/goals with staff:  Yes Medical problems stabilized or resolved:  Yes Denies suicidal/homicidal ideation: Yes Issues/concerns per patient self-inventory:  Yes Other:  New problem(s) identified: N/A  Discharge Plan or Barriers: Pt will follow up at [facility] for further inpatient treatment and [facility] for outpatient medication management and therapy.    Reason for Continuation of Hospitalization: Anxiety Depression Substance Abuse Medication Stabilization  Comments: N/A  Estimated length of stay: 3-4 days  For review of initial/current patient goals, please see plan of care.  Attendees: Patient:     Family:     Physician:  Dr. Tawni Carnes 08/09/2013 10:36 AM   Nursing:   Burnetta Sabin, RN 08/09/2013 10:36 AM   Clinical Social Worker:  Reyes Ivan, LCSWA 08/09/2013 10:36 AM   Other: Onnie Boer, RN case manager 08/09/2013 10:36 AM   Other:  Trula Slade, LCSWA 08/09/2013 10:36 AM   Other:  Serena Colonel, NP 08/09/2013 10:36 AM   Other:  Wenda Overland, RN 08/09/2013 10:37 AM   Other:    Other:    Other:    Other:    Other:    Other:     Scribe for Treatment Team:   Carmina Miller, 08/09/2013 , 10:36 AM

## 2013-08-10 MED ORDER — LOPERAMIDE HCL 2 MG PO CAPS
2.0000 mg | ORAL_CAPSULE | ORAL | Status: DC | PRN
  Administered 2013-08-10: 2 mg via ORAL
  Filled 2013-08-10: qty 1

## 2013-08-10 NOTE — [facility] Group Notes (Signed)
[facility] Group Notes:  (Nursing/MHT/Case Management/Adjunct)  Date:  08/10/2013  Time:  1330  Type of Therapy:  Nurse Education  Participation Level:  Active  Participation Quality:  Appropriate and Attentive  Affect:  Appropriate  Cognitive:  Alert and Appropriate  Insight:  Improving  Engagement in Group:  Engaged  Modes of Intervention:  Education  Summary of Progress/Problems:  Chad Turner 08/10/2013, 3:04 PM

## 2013-08-10 NOTE — [facility] Group Notes (Signed)
[facility] Group Notes:  (Nursing/MHT/Case Management/Adjunct)  Date:  08/10/2013  Time:  4:14 PM  Type of Therapy:  Psychoeducational Skills  Participation Level:  Did Not Attend  Participation Quality:  na  Affect:  na  Cognitive:  na  Insight:  None  Engagement in Group:  na  Modes of Intervention:  na  Summary of Progress/Problems:   Chad Turner 08/10/2013, 4:14 PM

## 2013-08-10 NOTE — Progress Notes (Signed)
D: Pt mood is depressed but he brightens upon approach. He interacts well within the milieu and is appropriate and cooperative.  A: Support given. Verbalization encouraged. Medications given as prescribed. Pt encouraged to come to nurse with any concerns.  R: Pt is responsive. No complaints of pain or discomfort. Will continue to monitor pt. Q15 min safety checks maintained

## 2013-08-10 NOTE — [facility] Group Notes (Signed)
[facility] Group Notes: (Clinical Social Work)   08/10/2013      Type of Therapy:  Group Therapy   Participation Level:  Did Not Attend    Ambrose Mantle, LCSW 08/10/2013, 12:05 PM

## 2013-08-10 NOTE — Progress Notes (Signed)
Patient ID: Chad Turner, male   DOB: [birth year], 52 y.o.   MRN: [id number] D. Patient presents with depressed mood, affect appropriate. Patient denies any acute concerns stating '' No I'm feeling fine, I've just had trouble with my stomach, having some diarrhea '' Patient completed self inventory and rates depression at 5/10 on depression scale, 10 being worst depression 1 being least. Patient also reports poor sleep. A. Allowed pt to ventilate, support and encouragement provided. Encouraged pt to discuss above concerns with MD. R . Patient has been attending unit programming, visible in the milieu, noted to joke and laugh with peers and staff. In no acute distress. Will continue to monitor q 15 minutes for safety.

## 2013-08-11 DIAGNOSIS — F1410 Cocaine abuse, uncomplicated: Secondary | ICD-10-CM

## 2013-08-11 NOTE — [facility] Group Notes (Signed)
[facility] Group Notes:  (Nursing/MHT/Case Management/Adjunct)  Date:  08/11/2013  Time:  2:36 PM  Type of Therapy: Psychoeducational Skills  Participation Level: Active  Participation Quality: Appropriate  Affect: Appropriate  Cognitive: Alert  Insight: Appropriate  Engagement in Group: Engaged  Modes of Intervention: Discussion, Education and Exploration  Summary of Progress/Problems: Healthy support systems reviewed, discussed healthy coping skills, and reviewed self inventory with RN. Patient reports '' I feel good about follow up plan going to [facility] '' . Patient was attentive, and sharing during group.    Malva Limes 08/11/2013, 2:36 PM

## 2013-08-11 NOTE — Progress Notes (Signed)
Patient did attend the evening speaker AA meeting.  
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
## 2013-08-11 NOTE — Progress Notes (Signed)
Patient ID: Chad Turner, male   DOB: January 06, 1961, 52 y.o.   MRN: [id number] D. Patient presents with depressed mood, affect appropriate. Patient denies any acute concerns in am stating '' I'm feeling better, good today '' Patient completed self inventory and rates depression at 5/10 on depression scale, 10 being worst depression 1 being least. Patient reports sleep improved. Patient states '' Well I know I'm not ready to go home, I'm glad I'm going to [facility] ''  Pt denies SI/HI. A. Allowed pt to ventilate, support and encouragement provided. Patient has denied any further voiced concerns at this time.. R . Patient has been attending unit programming, visible in the milieu, noted to joke and laugh with peers and staff. In no acute distress. Will continue to monitor q 15 minutes for safety.
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
## 2013-08-11 NOTE — [facility] Group Notes (Signed)
[facility] Group Notes:  (Clinical Social Work)  08/11/2013  10:00-11:00AM  Summary of Progress/Problems:   The main focus of today's process group was to   identify the patient's current support system and decide on other supports that can be put in place.  The picture on workbook was used to discuss why additional supports are needed, and a hand-out was distributed with four definitions/levels of support, then used to talk about how patients have given and received all different kinds of support.  An emphasis was placed on using counselor, doctor, therapy groups, 12-step groups, and problem-specific support groups to expand supports.  The patient identified current supports as being his family, NA, and an Charity fundraiser at [facility] who picks him up for his appointments.  He had a sponsor who has relapsed, so he needs to seek a new sponsor.  He was very involved throughout the discussion today.  Type of Therapy:  Process Group with Motivational Interviewing  Participation Level:  Active  Participation Quality:  Attentive, Sharing and Supportive  Affect:  Blunted  Cognitive:  Oriented  Insight:  Engaged  Engagement in Therapy:  Engaged  Modes of Intervention:   Education, Support and Processing, Activity  Ambrose Mantle, LCSW 08/11/2013, 12:33 PM

## 2013-08-11 NOTE — Progress Notes (Signed)
Adult Psychoeducational Group Note  Date:  08/10/13 Time:  8:00 pm  Group Topic/Focus:  Wrap-Up Group:   The focus of this group is to help patients review their daily goal of treatment and discuss progress on daily workbooks.  Participation Level:  Active  Participation Quality:  Appropriate and Sharing  Affect:  Appropriate  Cognitive:  Appropriate  Insight: Appropriate  Engagement in Group:  Engaged  Modes of Intervention:  Discussion, Education, Socialization and Support  Additional Comments: Pt stated that he is the hospital for alcohol. Pt stated that he is a good person that tries to help others when he can.   Laural Benes, Emalyn Schou 08/11/2013, 12:34 AM

## 2013-08-11 NOTE — Progress Notes (Signed)
Patient ID: Chad Turner, male   DOB: 1961-10-14, 52 y.o.   MRN: [id number]  D: Patient pleasant and cooperative with assessment. Pt interacting well with staff/peers on unit.  A: Q 15 minute safety checks, encourage group participation and medication compliance. R: Pt compliant with medications and group sessions. No s/s of distress noted.

## 2013-08-12 DIAGNOSIS — F1910 Other psychoactive substance abuse, uncomplicated: Secondary | ICD-10-CM

## 2013-08-12 DIAGNOSIS — F1010 Alcohol abuse, uncomplicated: Secondary | ICD-10-CM

## 2013-08-12 DIAGNOSIS — F411 Generalized anxiety disorder: Secondary | ICD-10-CM

## 2013-08-12 MED ORDER — TRAZODONE HCL 50 MG PO TABS: 50.0000 mg | ORAL_TABLET | Freq: Every evening | ORAL | Status: DC | PRN

## 2013-08-12 MED ORDER — CLONIDINE HCL 0.1 MG/24HR TD PTWK: 1.0000 | MEDICATED_PATCH | TRANSDERMAL | Status: DC

## 2013-08-12 NOTE — B[facility] Group Notes (Signed)
[facility] LCSW Aftercare Discharge Planning Group Note   08/12/2013 10:33 AM  Participation Quality:  DID NOT ATTEND   Smart, Chad Turner

## 2013-08-12 NOTE — Progress Notes (Signed)
[place] [facility] Management Discharge Plan :  Will you be returning to the same living situation after discharge: Yes,  returning home and going to [facility] tomorrow At discharge, do you have transportation home?:Yes,  access to transportation Do you have the ability to pay for your medications:Yes,  access to meds  Release of information consent forms completed and in the chart;  Patient's signature needed at discharge.  Patient to Follow up at: Follow-up Information   Follow up with [facility] On 08/13/2013. (Arrive at 8:00 am promptly for admission screening.  Bring supply of medication and belongings. Date given by Brennan Bailey)    Contact information:   [address] [place], [place] [address] Phone: [phone] Fax: [phone]      Follow up with [facility]. (Walk in for medication management and therapy.  Walk in clinic is Monday - Friday 8 am - 3 pm.  )    Contact information:   [address], [place] [address] Phone: [phone] Fax: [phone]      Patient denies SI/HI:   Yes,  denies SI/HI    Safety Planning and Suicide Prevention discussed:  Yes,  discussed with pt, n/a to contact family/friend due to no SI on admission.    Horton, Salome Arnt 08/12/2013, 12:00 PM

## 2013-08-12 NOTE — Progress Notes (Signed)
D/C instructions/meds/follow-up appointments reviewed, pt verbalized understanding, pt's belongings returned to pt. 
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
## 2013-08-12 NOTE — Progress Notes (Signed)
Adult Psychoeducational Group Note  Date:  08/12/2013 Time:  1:47 PM  Group Topic/Focus:  Self Care:   The focus of this group is to help patients understand the importance of self-care in order to improve or restore emotional, physical, spiritual, interpersonal, and financial health.  Participation Level:  Active  Participation Quality:  Appropriate  Affect:  Appropriate  Cognitive:  Appropriate  Insight: Appropriate  Engagement in Group:  Engaged  Modes of Intervention:  Activity and Discussion  Additional Comments:   In addition to completing the self-care worksheets pts watched a video "The Sober Life-My Behavior" as this relates to wellness and self-care. Pts engaged in discussion on how they could relate to the topics presented on the video.    Tora Perches N 08/12/2013, 1:47 PM

## 2013-08-15 NOTE — Progress Notes (Signed)
Patient Discharge Instructions:  After Visit Summary (AVS):   Faxed to:  08/15/13 Discharge Summary Note:   Faxed to:  08/15/13 Psychiatric Admission Assessment Note:   Faxed to:  08/15/13 Suicide Risk Assessment - Discharge Assessment:   Faxed to:  08/15/13 Faxed/Sent to the Next Level Care provider:  08/15/13 Faxed to [facility] @ [phone] Faxed to [facility] @ [place], 08/15/2013, 3:20 PM

## 2013-10-19 ENCOUNTER — Emergency Department (HOSPITAL_COMMUNITY)
Admission: EM | Admit: 2013-10-19 | Discharge: 2013-10-19 | Disposition: A | Discharge status: Other Acute Inpatient Hospital | Payer: Medicare Other | Source: Home / Self Care | Attending: Emergency Medicine | Admitting: Emergency Medicine

## 2013-10-19 ENCOUNTER — Inpatient Hospital Stay (HOSPITAL_COMMUNITY)
Admission: AD | Admit: 2013-10-19 | Discharge: 2013-10-24 | DRG: 897 | Disposition: A | Discharge status: Home / Self Care | Payer: Medicare Other | Source: Intra-hospital | Attending: Psychiatry | Admitting: Psychiatry

## 2013-10-19 ENCOUNTER — Encounter (HOSPITAL_COMMUNITY): Payer: Self-pay | Admitting: Emergency Medicine

## 2013-10-19 DIAGNOSIS — F1020 Alcohol dependence, uncomplicated: Secondary | ICD-10-CM

## 2013-10-19 DIAGNOSIS — F10229 Alcohol dependence with intoxication, unspecified: Secondary | ICD-10-CM | POA: Insufficient documentation

## 2013-10-19 DIAGNOSIS — F3289 Other specified depressive episodes: Secondary | ICD-10-CM | POA: Diagnosis present

## 2013-10-19 DIAGNOSIS — F1019 Alcohol abuse with unspecified alcohol-induced disorder: Secondary | ICD-10-CM

## 2013-10-19 DIAGNOSIS — Z87828 Personal history of other (healed) physical injury and trauma: Secondary | ICD-10-CM | POA: Insufficient documentation

## 2013-10-19 DIAGNOSIS — R45851 Suicidal ideations: Secondary | ICD-10-CM

## 2013-10-19 DIAGNOSIS — Z79899 Other long term (current) drug therapy: Secondary | ICD-10-CM | POA: Insufficient documentation

## 2013-10-19 DIAGNOSIS — Z5989 Other problems related to housing and economic circumstances: Secondary | ICD-10-CM

## 2013-10-19 DIAGNOSIS — I10 Essential (primary) hypertension: Secondary | ICD-10-CM | POA: Insufficient documentation

## 2013-10-19 DIAGNOSIS — F411 Generalized anxiety disorder: Secondary | ICD-10-CM | POA: Diagnosis present

## 2013-10-19 DIAGNOSIS — F10930 Alcohol use, unspecified with withdrawal, uncomplicated: Secondary | ICD-10-CM | POA: Diagnosis present

## 2013-10-19 DIAGNOSIS — IMO0001 Reserved for inherently not codable concepts without codable children: Secondary | ICD-10-CM | POA: Diagnosis present

## 2013-10-19 DIAGNOSIS — F329 Major depressive disorder, single episode, unspecified: Secondary | ICD-10-CM | POA: Diagnosis present

## 2013-10-19 DIAGNOSIS — F1994 Other psychoactive substance use, unspecified with psychoactive substance-induced mood disorder: Secondary | ICD-10-CM | POA: Diagnosis present

## 2013-10-19 DIAGNOSIS — G4700 Insomnia, unspecified: Secondary | ICD-10-CM | POA: Diagnosis present

## 2013-10-19 DIAGNOSIS — F1420 Cocaine dependence, uncomplicated: Secondary | ICD-10-CM | POA: Diagnosis present

## 2013-10-19 DIAGNOSIS — Z23 Encounter for immunization: Secondary | ICD-10-CM

## 2013-10-19 DIAGNOSIS — F1410 Cocaine abuse, uncomplicated: Secondary | ICD-10-CM | POA: Diagnosis present

## 2013-10-19 DIAGNOSIS — R1110 Vomiting, unspecified: Secondary | ICD-10-CM | POA: Insufficient documentation

## 2013-10-19 DIAGNOSIS — R1013 Epigastric pain: Secondary | ICD-10-CM | POA: Insufficient documentation

## 2013-10-19 DIAGNOSIS — J45909 Unspecified asthma, uncomplicated: Secondary | ICD-10-CM | POA: Insufficient documentation

## 2013-10-19 DIAGNOSIS — F1210 Cannabis abuse, uncomplicated: Secondary | ICD-10-CM | POA: Insufficient documentation

## 2013-10-19 DIAGNOSIS — Z5987 Material hardship due to limited financial resources, not elsewhere classified: Secondary | ICD-10-CM

## 2013-10-19 DIAGNOSIS — Z880 Allergy status to penicillin: Secondary | ICD-10-CM | POA: Insufficient documentation

## 2013-10-19 DIAGNOSIS — Z598 Other problems related to housing and economic circumstances: Secondary | ICD-10-CM

## 2013-10-19 DIAGNOSIS — F10230 Alcohol dependence with withdrawal, uncomplicated: Secondary | ICD-10-CM | POA: Diagnosis present

## 2013-10-19 LAB — COMPREHENSIVE METABOLIC PANEL
ALT: 47 U/L (ref 0–53)
AST: 63 U/L — ABNORMAL HIGH (ref 0–37)
Albumin: 3.8 g/dL (ref 3.5–5.2)
Alkaline Phosphatase: 105 U/L (ref 39–117)
BUN: 6 mg/dL (ref 6–23)
CO2: 20 mEq/L (ref 19–32)
Calcium: 9.3 mg/dL (ref 8.4–10.5)
Chloride: 90 mEq/L — ABNORMAL LOW (ref 96–112)
Creatinine, Ser: 1.04 mg/dL (ref 0.50–1.35)
GFR calc Af Amer: >90 mL/min (ref >90)
GFR calc non Af Amer: 80 mL/min — ABNORMAL LOW (ref >90)
Glucose, Bld: 113 mg/dL — ABNORMAL HIGH (ref 70–99)
Potassium: 4 mEq/L (ref 3.5–5.1)
Sodium: 125 mEq/L — ABNORMAL LOW (ref 135–145)
Total Bilirubin: 1 mg/dL (ref 0.3–1.2)
Total Protein: 8 g/dL (ref 6.0–8.3)

## 2013-10-19 LAB — CBC
HCT: 39.9 % (ref 39.0–52.0)
Hemoglobin: 13.7 g/dL (ref 13.0–17.0)
MCH: 30 pg (ref 26.0–34.0)
MCHC: 34.3 g/dL (ref 30.0–36.0)
MCV: 87.3 fL (ref 78.0–100.0)
Platelets: 206 10*3/uL (ref 150–400)
RBC: 4.57 MIL/uL (ref 4.22–5.81)
RDW: 14.8 % (ref 11.5–15.5)
WBC: 7.2 10*3/uL (ref 4.0–10.5)

## 2013-10-19 LAB — BASIC METABOLIC PANEL
BUN: 6 mg/dL (ref 6–23)
CO2: 25 mEq/L (ref 19–32)
Calcium: 8.7 mg/dL (ref 8.4–10.5)
Chloride: 100 mEq/L (ref 96–112)
Creatinine, Ser: 1.1 mg/dL (ref 0.50–1.35)
GFR calc Af Amer: 86 mL/min — ABNORMAL LOW (ref >90)
GFR calc non Af Amer: 74 mL/min — ABNORMAL LOW (ref >90)
Glucose, Bld: 104 mg/dL — ABNORMAL HIGH (ref 70–99)
Potassium: 4.2 mEq/L (ref 3.5–5.1)
Sodium: 132 mEq/L — ABNORMAL LOW (ref 135–145)

## 2013-10-19 LAB — RAPID URINE DRUG SCREEN, HOSP PERFORMED
Amphetamines: POSITIVE — AB
Barbiturates: NOT DETECTED
Benzodiazepines: NOT DETECTED
Cocaine: POSITIVE — AB
Opiates: NOT DETECTED
Tetrahydrocannabinol: POSITIVE — AB

## 2013-10-19 LAB — SALICYLATE LEVEL: Salicylate Lvl: <2 mg/dL — ABNORMAL LOW (ref 2.8–20.0)

## 2013-10-19 LAB — ETHANOL: Alcohol, Ethyl (B): 31 mg/dL — ABNORMAL HIGH (ref 0–11)

## 2013-10-19 LAB — ACETAMINOPHEN LEVEL: Acetaminophen (Tylenol), Serum: <15 ug/mL (ref 10–30)

## 2013-10-19 LAB — LIPASE, BLOOD: Lipase: 30 U/L (ref 11–59)

## 2013-10-19 MED ORDER — PANTOPRAZOLE SODIUM 40 MG IV SOLR
40.0000 mg | Freq: Once | INTRAVENOUS | Status: AC
  Administered 2013-10-19: 40 mg via INTRAVENOUS
  Filled 2013-10-19: qty 40

## 2013-10-19 MED ORDER — ONDANSETRON HCL 4 MG/2ML IJ SOLN
4.0000 mg | Freq: Once | INTRAMUSCULAR | Status: AC
  Administered 2013-10-19: 4 mg via INTRAVENOUS
  Filled 2013-10-19: qty 2

## 2013-10-19 MED ORDER — GI COCKTAIL ~~LOC~~
30.0000 mL | Freq: Once | ORAL | Status: AC
  Administered 2013-10-19: 30 mL via ORAL
  Filled 2013-10-19: qty 30

## 2013-10-19 MED ORDER — SODIUM CHLORIDE 0.9 % IV BOLUS (SEPSIS)
1000.0000 mL | Freq: Once | INTRAVENOUS | Status: AC
  Administered 2013-10-19: 1000 mL via INTRAVENOUS

## 2013-10-19 MED ORDER — SODIUM CHLORIDE 0.9 % IV SOLN
INTRAVENOUS | Status: DC
  Administered 2013-10-19: 17:00:00 via INTRAVENOUS

## 2013-10-19 NOTE — ED Notes (Addendum)
TTS in place, for consult. Monitor at bedside.  Pt given sandwich and beverage.

## 2013-10-19 NOTE — BH Assessment (Signed)
Assessment complete. Consulted with Laverle Hobby, [facility] who confirmed bed availability. Gave report to Alberteen Sam, NP who accepted Pt to the service of Dr. Geoffery Lyons, room 301-1. Notified Dr. Lorre Nick of acceptance.Pamalee Leyden, [facility], [facility] Triage Specialist

## 2013-10-19 NOTE — ED Notes (Signed)
Pt had 4 bags of belongings contacted security to have him wanted.
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
## 2013-10-19 NOTE — BH Assessment (Signed)
Received request for tele-assessment. Spoke to Dr. Lorre Nick who said Pt is medically cleared and requesting alcohol detox. Tele-assessment will be initiated.  Chad Turner, [facility] Triage Specialist

## 2013-10-19 NOTE — ED Notes (Signed)
Patient is resting comfortably. Breathing WNL.  

## 2013-10-19 NOTE — ED Notes (Signed)
Pt from home requesting detox from ETOH, marijuana, cocaine. Pt last drink and drug use was last pm. Pt reports that he drinks approx "11 quarts a day."  Pt states that he is unable to keep food down. Pt is A&O and in NAD

## 2013-10-19 NOTE — ED Notes (Signed)
[place] transportation called  
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
## 2013-10-20 ENCOUNTER — Encounter (HOSPITAL_COMMUNITY): Payer: Self-pay | Admitting: Behavioral Health

## 2013-10-20 DIAGNOSIS — F10230 Alcohol dependence with withdrawal, uncomplicated: Secondary | ICD-10-CM | POA: Diagnosis present

## 2013-10-20 DIAGNOSIS — F1994 Other psychoactive substance use, unspecified with psychoactive substance-induced mood disorder: Secondary | ICD-10-CM | POA: Diagnosis present

## 2013-10-20 MED ORDER — HYDROCHLOROTHIAZIDE 12.5 MG PO CAPS
12.5000 mg | ORAL_CAPSULE | Freq: Every day | ORAL | Status: DC
  Administered 2013-10-20 – 2013-10-24 (×5): 12.5 mg via ORAL
  Filled 2013-10-20 (×7): qty 1

## 2013-10-20 MED ORDER — HYDROXYZINE HCL 25 MG PO TABS: 25.0000 mg | ORAL_TABLET | Freq: Four times a day (QID) | ORAL | Status: AC | PRN

## 2013-10-20 MED ORDER — CHLORDIAZEPOXIDE HCL 25 MG PO CAPS
25.0000 mg | ORAL_CAPSULE | Freq: Three times a day (TID) | ORAL | Status: AC
  Administered 2013-10-21 – 2013-10-22 (×3): 25 mg via ORAL
  Filled 2013-10-20 (×3): qty 1

## 2013-10-20 MED ORDER — THIAMINE HCL 100 MG/ML IJ SOLN
100.0000 mg | Freq: Once | INTRAMUSCULAR | Status: AC
  Administered 2013-10-20: 100 mg via INTRAMUSCULAR
  Filled 2013-10-20: qty 2

## 2013-10-20 MED ORDER — PNEUMOCOCCAL VAC POLYVALENT 25 MCG/0.5ML IJ INJ: 0.5000 mL | INJECTION | INTRAMUSCULAR | Status: DC

## 2013-10-20 MED ORDER — LISINOPRIL-HYDROCHLOROTHIAZIDE 10-12.5 MG PO TABS: 1.0000 | ORAL_TABLET | Freq: Every day | ORAL | Status: DC

## 2013-10-20 MED ORDER — ACETAMINOPHEN 325 MG PO TABS
650.0000 mg | ORAL_TABLET | Freq: Four times a day (QID) | ORAL | Status: DC | PRN
  Administered 2013-10-20 – 2013-10-21 (×2): 650 mg via ORAL
  Filled 2013-10-20 (×2): qty 2

## 2013-10-20 MED ORDER — MAGNESIUM HYDROXIDE 400 MG/5ML PO SUSP: 30.0000 mL | Freq: Every day | ORAL | Status: DC | PRN

## 2013-10-20 MED ORDER — CHLORDIAZEPOXIDE HCL 25 MG PO CAPS: 25.0000 mg | ORAL_CAPSULE | Freq: Four times a day (QID) | ORAL | Status: AC | PRN

## 2013-10-20 MED ORDER — CHLORDIAZEPOXIDE HCL 25 MG PO CAPS
25.0000 mg | ORAL_CAPSULE | Freq: Every day | ORAL | Status: AC
  Administered 2013-10-24: 25 mg via ORAL
  Filled 2013-10-20: qty 1

## 2013-10-20 MED ORDER — LISINOPRIL 10 MG PO TABS
10.0000 mg | ORAL_TABLET | Freq: Every day | ORAL | Status: DC
  Administered 2013-10-20 – 2013-10-24 (×5): 10 mg via ORAL
  Filled 2013-10-20 (×6): qty 1

## 2013-10-20 MED ORDER — LOPERAMIDE HCL 2 MG PO CAPS: 2.0000 mg | ORAL_CAPSULE | ORAL | Status: AC | PRN

## 2013-10-20 MED ORDER — ONDANSETRON 4 MG PO TBDP: 4.0000 mg | ORAL_TABLET | Freq: Four times a day (QID) | ORAL | Status: AC | PRN

## 2013-10-20 MED ORDER — CHLORDIAZEPOXIDE HCL 25 MG PO CAPS
25.0000 mg | ORAL_CAPSULE | ORAL | Status: AC
  Administered 2013-10-22 – 2013-10-23 (×2): 25 mg via ORAL
  Filled 2013-10-20 (×2): qty 1

## 2013-10-20 MED ORDER — ADULT MULTIVITAMIN W/MINERALS CH
1.0000 | ORAL_TABLET | Freq: Every day | ORAL | Status: DC
  Administered 2013-10-20 – 2013-10-24 (×5): 1 via ORAL
  Filled 2013-10-20 (×6): qty 1

## 2013-10-20 MED ORDER — TRAZODONE HCL 50 MG PO TABS
50.0000 mg | ORAL_TABLET | Freq: Every evening | ORAL | Status: DC | PRN
  Administered 2013-10-20 – 2013-10-23 (×5): 50 mg via ORAL
  Filled 2013-10-20 (×4): qty 1
  Filled 2013-10-20: qty 14
  Filled 2013-10-20: qty 1

## 2013-10-20 MED ORDER — VITAMIN B-1 100 MG PO TABS
100.0000 mg | ORAL_TABLET | Freq: Every day | ORAL | Status: DC
  Administered 2013-10-21 – 2013-10-24 (×4): 100 mg via ORAL
  Filled 2013-10-20 (×5): qty 1

## 2013-10-20 MED ORDER — CHLORDIAZEPOXIDE HCL 25 MG PO CAPS
50.0000 mg | ORAL_CAPSULE | Freq: Once | ORAL | Status: AC
  Administered 2013-10-20: 50 mg via ORAL
  Filled 2013-10-20: qty 2

## 2013-10-20 MED ORDER — ALUM & MAG HYDROXIDE-SIMETH 200-200-20 MG/5ML PO SUSP
30.0000 mL | ORAL | Status: DC | PRN
  Administered 2013-10-20 (×3): 30 mL via ORAL

## 2013-10-20 MED ORDER — CHLORDIAZEPOXIDE HCL 25 MG PO CAPS
25.0000 mg | ORAL_CAPSULE | Freq: Four times a day (QID) | ORAL | Status: AC
  Administered 2013-10-20 – 2013-10-21 (×6): 25 mg via ORAL
  Filled 2013-10-20 (×6): qty 1

## 2013-10-20 NOTE — [facility] Suicide Risk Assessment (Signed)
Suicide Risk Assessment  Admission Assessment     Nursing information obtained from:  Patient Demographic factors:  Male Current Mental Status:  NA Loss Factors:  NA Historical Factors:  NA Risk Reduction Factors:  Sense of responsibility to family  CLINICAL FACTORS:   Severe Anxiety and/or Agitation Depression:   Anhedonia Comorbid alcohol abuse/dependence Hopelessness Impulsivity Insomnia Recent sense of peace/wellbeing Severe Alcohol/Substance Abuse/Dependencies More than one psychiatric diagnosis Unstable or Poor Therapeutic Relationship Previous Psychiatric Diagnoses and Treatments Medical Diagnoses and Treatments/Surgeries  COGNITIVE FEATURES THAT CONTRIBUTE TO RISK:  Closed-mindedness Loss of executive function Polarized thinking Thought constriction (tunnel vision)    SUICIDE RISK:   Moderate:  Frequent suicidal ideation with limited intensity, and duration, some specificity in terms of plans, no associated intent, good self-control, limited dysphoria/symptomatology, some risk factors present, and identifiable protective factors, including available and accessible social support.  PLAN OF CARE: Admit for alcohol detox treatment, safety monitoring, crisis stabilization, medication management for substance induced mood disorder and substance abuse counseling.  I certify that inpatient services furnished can reasonably be expected to improve the patient's condition.  Audriana Aldama,JANARDHAHA R. 10/20/2013, 12:38 PM

## 2013-10-20 NOTE — Progress Notes (Signed)
Patient ID: Chad Turner, male   DOB: 27-Oct-1961, 52 y.o.   MRN: [id number]  Pt is a 52 year old male admitted voluntarily requesting detox from alcohol. Pt states that he drinks about ten 40 oz a day. He reports using about $100 of cocaine a week and THC use. His longest period of sobriety was for one year and that was 5 years ago.He denies any recent stressors and denies being depressed. He does state that he feels lonely at times. He denies any SI/HI/AVH. He lists his only social support as his mother, although he does have 2 adult daughters. He was recently admitted here a few months ago. He has a history of hypertension. Pt educated on unit rules and patient verbalized understanding. Consents signed and pt verbalized understanding. Belongings searched and pt's skin visually assessed by RN. Pt remains safe on the unit. Q15 min safety checks maintained.

## 2013-10-20 NOTE — Care Management Utilization Note (Signed)
Per State Regulation 482.30  The chart was reviewed for necessity with respect to the patient's Admission/ Duration of stay. Admission 10/19/2013  Next Review Date:10/22/2013  Lacinda Axon, RN, BSN

## 2013-10-20 NOTE — Progress Notes (Signed)
Patient ID: Chad Turner, male   DOB: Sep 07, 1961, 52 y.o.   MRN: [id number]  D: Patient withdrawn and endorsing depression, staying in his room for the majority of the shift. A:Q 15 minute safety checks, encourage staff/peer interaction and group participation. Administer medications as ordered by MD. R: Pt denies SI at this time. No distress noted.

## 2013-10-20 NOTE — Tx Team (Signed)
Initial Interdisciplinary Treatment Plan  PATIENT STRENGTHS: (choose at least two) Average or above average intelligence Capable of independent living General fund of knowledge Physical Health  PATIENT STRESSORS: Substance abuse   PROBLEM LIST: Problem List/Patient Goals Date to be addressed Date deferred Reason deferred Estimated date of resolution  Substance abuse 10/20/13                                                      DISCHARGE CRITERIA:  Ability to meet basic life and health needs Withdrawal symptoms are absent or subacute and managed without 24-hour nursing intervention  PRELIMINARY DISCHARGE PLAN: Attend 12-step recovery group Return to previous living arrangement Return to previous work or school arrangements  PATIENT/FAMIILY INVOLVEMENT: This treatment plan has been presented to and reviewed with the patient, BURDETTE FOREHAND, and/or family member.  The patient and family have been given the opportunity to ask questions and make suggestions.  Leda Quail T 10/20/2013, 1:05 AM

## 2013-10-20 NOTE — Progress Notes (Signed)
Adult Psychoeducational Group Note  Date:  10/20/2013 Time:  9:42 PM  Group Topic/Focus:  AA group  Participation Level:  Active  Participation Quality:  Appropriate  Affect:  Appropriate  Cognitive:  Alert  Insight: Appropriate  Engagement in Group:  Engaged  Modes of Intervention:  Discussion  Additional Comments:    Flonnie Hailstone 10/20/2013, 9:42 PM
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
## 2013-10-20 NOTE — BHH Counselor (Signed)
Adult Psychosocial Assessment Update Interdisciplinary Team  Previous [facility] admissions/discharges:  Admissions Discharges  Date:  08/04/13 Date:  08/12/13  Date:  05/22/13 Date:  05/24/13  Date:  12/10/12 Date:12/17/12  Date:  07/06/12 Date:  07/11/12  Date:   01/10/12 Date:  01/17/12   Changes since the last Psychosocial Assessment (including adherence to outpatient mental health and/or substance abuse treatment, situational issues contributing to decompensation and/or relapse). At last discharge, went to [facility] the next day and was admitted.  Left that program after approximately 10 days because he needed to pay his bills.  He asked if he could return after paying his bills, but that was not allowed.  He remained sober about two additional weeks, then relapsed on alcohol.  He would like to return to [facility].  He has put his mother on his bank account, so leaving to pay bills will no longer be an issue.  Is drinking about ten 40-oz. Beers daily.  Has gone to several AA and NA meetings.  His sponsor also relapsed, and the patient has not obtained another sponsor at this time.           Discharge Plan 1. Will you be returning to the same living situation after discharge?   Yes:  X No:      If no, what is your plan?    Has his own apartment, rent is paid.       2. Would you like a referral for services when you are discharged? Yes:  X   If yes, for what services?  No:       Would like a referral to [facility].  Had tried to set up an appointment at [facility] unsuccessfully.       Summary and Recommendations (to be completed by the evaluator) This is the 6th [facility] admission for this 52yo African-American male who admitted for detox from alcohol.  He went to [facility] after his last discharge, but left there to pay bills.  He states he went to no other outpatient after that, and relapsed 2 weeks later.  He did try to call [facility] for an  appointment, unsuccessfully, and he did attend "a couple" of AA and NA meetings.  His sponsor relapsed and he has not gotten a new one.  He has now put his mother on his bank account, so he will not have to leave a program to pay bills, and he is requesting placement in long-term rehab such as [facility].  He has his own apartment where he will return afterwards.  He would benefit from safety monitoring, medication evaluation, psychoeducation, group therapy, and discharge planning to link with ongoing resources.                        Signature:  Sarina Ser, 10/20/2013 9:15 AM

## 2013-10-20 NOTE — BHH Group Notes (Signed)
[facility] Group Notes:  (Nursing/MHT/Case Management/Adjunct)  Date:  10/20/2013  Time:  10:03 AM  Type of Therapy:  Psychoeducational Skills  Participation Level:  Did Not Attend  Participation Quality:  Did Not Attend  Affect:  Did Not Attend  Cognitive:  Did Not Attend  Insight:  None  Engagement in Group:  Did Not Attend  Modes of Intervention:  Did Not Attend  Summary of Progress/Problems:  Renaee Munda 10/20/2013, 10:03 AM

## 2013-10-20 NOTE — BHH Group Notes (Signed)
B[facility] Group Notes: (Clinical Social Work)   10/20/2013      Type of Therapy:  Group Therapy   Participation Level:  Did Not Attend    Ambrose Mantle, LCSW 10/20/2013, 12:10 PM

## 2013-10-20 NOTE — BHH Group Notes (Signed)
[facility] Group Notes:  (Nursing/MHT/Case Management/Adjunct)  Date:  10/20/2013  Time:  9:49 AM  Type of Therapy:  Psychoeducational Skills  Participation Level:  Did Not Attend  Participation Quality:  Did Not Attend  Affect:  Did Not Attend  Cognitive:  Did Not Attend  Insight:  None  Engagement in Group:  Did Not Attend  Modes of Intervention:  Did Not Attend  Summary of Progress/Problems:  Chad Turner 10/20/2013, 9:49 AM 

## 2013-10-21 DIAGNOSIS — F1994 Other psychoactive substance use, unspecified with psychoactive substance-induced mood disorder: Secondary | ICD-10-CM

## 2013-10-21 DIAGNOSIS — F1020 Alcohol dependence, uncomplicated: Principal | ICD-10-CM

## 2013-10-21 DIAGNOSIS — F10988 Alcohol use, unspecified with other alcohol-induced disorder: Secondary | ICD-10-CM

## 2013-10-21 DIAGNOSIS — F1410 Cocaine abuse, uncomplicated: Secondary | ICD-10-CM

## 2013-10-21 NOTE — Progress Notes (Signed)
NUTRITION ASSESSMENT  Pt identified as at risk on the Malnutrition Screen Tool  INTERVENTION: 1. Educated patient on the importance of nutrition and encouraged intake of food and beverages. 2. Discussed weight goals. 3. Supplements: MVI and thiamine daily  NUTRITION DIAGNOSIS: Unintentional weight loss related to sub-optimal intake as evidenced by pt report.   Goal: Pt to meet >/= 90% of their estimated nutrition needs.  Monitor:  PO intake  Assessment:  Patient admitted with etoh and cocaine abuse.  UBW:  210 lbs 3 months ago and states that he had not been eating well for the past 2 months prior to admit secondary to increased etoh use.  States that he has had some problems with his stomach prior to admit and problems with detox but is feeling better and tolerated breakfast this morning.    52 y.o. male  Height: Ht Readings from Last 1 Encounters:  10/20/13 5\' 9"  (1.753 m)    Weight: Wt Readings from Last 1 Encounters:  10/20/13 202 lb (91.627 kg)    Weight Hx: Wt Readings from Last 10 Encounters:  10/20/13 202 lb (91.627 kg)  08/04/13 187 lb (84.823 kg)  05/22/13 179 lb (81.194 kg)  12/10/12 171 lb (77.565 kg)  07/06/12 166 lb (75.297 kg)  05/08/12 189 lb (85.73 kg)  01/19/12 228 lb (103.42 kg)  01/10/12 205 lb (92.987 kg)  01/03/12 225 lb (102.059 kg)  01/03/12 225 lb (102.059 kg)    BMI:  Body mass index is 29.82 kg/(m^2). Pt meets criteria for overweight based on current BMI.  Estimated Nutritional Needs: Kcal: 25-30 kcal/kg Protein: > 1 gram protein/kg Fluid: 1 ml/kcal  Diet Order: General Pt is also offered choice of unit snacks mid-morning and mid-afternoon.  Pt is eating as desired.   Lab results and medications reviewed.   Oran Rein, RD, LDN Clinical Inpatient Dietitian Pager:  [phone] Weekend and after hours pager:  [phone]

## 2013-10-21 NOTE — BHH Group Notes (Signed)
BHH LCSW Group Therapy  10/21/2013 2:53 PM  Type of Therapy:  Group Therapy  Participation Level:  Active  Participation Quality:  Attentive, Sharing and Supportive  Affect:  Anxious and Blunted  Cognitive:  Alert and Appropriate  Insight:  Limited  Engagement in Therapy:  Limited  Modes of Intervention:  Discussion, Exploration and Problem-solving  Summary of Progress/Problems:  Roman was very involved in group discussion today surrounding topics of obstacles and barriers to soberity. He shares his past experiences of using drugs and the initial feeling of the high and how at times he looks forward to this.  Other times he shares that he feels guilty when he leaves the house and his mother cries.  Eliyah is able to give advice to others however struggles applying his advice of being sober,going to programs, and removing himself from the "streets".  He remains fixated on his discharge plan and going back to [facility] and lacking depth in the real reason he uses substances.  He is progressing and attended entire group.  Nail, Catalina Gravel 10/21/2013, 2:53 PM

## 2013-10-21 NOTE — BHH Group Notes (Signed)
[facility] LCSW Aftercare Discharge Planning Group Note   10/21/2013 12:26 PM  Participation Quality:  Active and cooperative  Mood/Affect:  Flat  Depression Rating:  Did not report  Anxiety Rating:  Reports a 4 due to plans a DC  Thoughts of Suicide:  No Will you contract for safety?   Yes  Current AVH:  No  Plan for Discharge/Comments:  Patient reports he wants to go long term at [facility]. He reports the last time he was here he did not follow recommendations. This time he reports he made his mother his payee to pay his bills so he would not lose his apartment. He shares that he wants to additional treatment. NO other follow up in place at this time.  Transportation Means:  Mother or bus  Supports: Mother  Nail, Catalina Gravel
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
## 2013-10-21 NOTE — Progress Notes (Signed)
The focus of this group is to help patients review their daily goal of treatment and discuss progress on daily workbooks. Pt attended the evening group session and responded to all discussion prompts from the Writer. Pt shared that today was a good day on the unit and that he had particularly enjoyed today's groups. "I felt like I was able to share things and be listened to." Pt's only additional request from Nursing Staff this evening was to shave, which he was allowed to do following group. Pt's affect was appropriate and he volunteered several encouraging comments to his peers.

## 2013-10-21 NOTE — Progress Notes (Signed)
Patient ID: Chad Turner, male   DOB: [birth year], 52 y.o.   MRN: [id number] D)  Has been out and about on the hall this evening, watching tv in the dayroom, laughing and interacting appropriately with staff and peers.  Has had various c/o's of w/d sx but coping well and states wants to get himself back on the right track and stay there this time.  Attended group, came to med window, compliant and participating in the milieu and program. A)  Will continue to monitor for safety, continue POC R)  Safety maintained

## 2013-10-21 NOTE — Progress Notes (Signed)
Patient ID: Chad Turner, male   DOB: Dec 14, 1960, 52 y.o.   MRN: [id number] He has been up and to groups interacting with peers and staff. Stated that he did not have withdrawal symptoms in the AM but this Pm he c/o a headache and received a prn tylenol that was helpful.

## 2013-10-21 NOTE — Progress Notes (Signed)
Adult Psychoeducational Group Note  Date:  10/21/2013 Time:  1:12 PM  Group Topic/Focus:  Self-Care Assessment  Participation Level:  Active  Participation Quality:  Appropriate, Sharing and Supportive  Affect:  Appropriate  Cognitive:  Alert and Appropriate  Insight: Appropriate  Engagement in Group:  Engaged  Modes of Intervention:  Discussion, Education and Exploration  Additional Comments: During this group Writer discussed the importance of Wellness and Self care. Patients had the opportunity to complete self care assessment in daily packet and discuss the results of assessment. Patients and Clinical research associate discussed the importance of working on the weak parts of the assessment and how to improve them   Lauralee Evener 10/21/2013, 1:12 PM

## 2013-10-21 NOTE — Progress Notes (Signed)
D   Pt has a depressed sad affect   He denies suicidal ideation and contracts for safety if feeling suicidal   He attends and participates in groups  His interaction with others is appropriate but minimal A   Verbal support given  Medications administered and effectiveness monitored   Q 15 min checks R  Pt safe at present

## 2013-10-22 NOTE — [facility] Suicide Risk Assessment (Signed)
[facility] INPATIENT: Family/Significant Other Suicide Prevention Education  Suicide Prevention Education:  Education Completed; No one has been identified by the patient as the family member/significant other with whom the patient will be residing, and identified as the person(s) who will aid the patient in the event of a mental health crisis (suicidal ideations/suicide attempt).   Pt did not c/o SI at admission, nor have they endorsed SI during their stay here. SPE not required. SPI pamphlet provided to pt and he was encouraged to share information with his support network, ask questions, and talk about any concerns relating to SPE.   [employer], LCSWA 10/22/2013 11:44 AM

## 2013-10-22 NOTE — Progress Notes (Signed)
Adult Psychoeducational Group Note  Date:  10/22/2013 Time:  1:24 PM  Group Topic/Focus:  Recovery Goals:   The focus of this group is to identify appropriate goals for recovery and establish a plan to achieve them.  Participation Level:  Active  Participation Quality:  Appropriate and Attentive  Affect:  Appropriate  Cognitive:  Alert and Appropriate  Insight: Good  Engagement in Group:  Engaged  Modes of Intervention:  Activity, Discussion, Education, Exploration, Socialization and Support  Additional Comments:  Pt came to group and shared that guilt and shame are standing between him and recovery. Pt plans on changing this by continuing treatment after he leaves here and attend counseling and go to [facility].   Cathlean Cower 10/22/2013, 1:24 PM

## 2013-10-22 NOTE — Progress Notes (Signed)
Recreation Therapy Notes  Date: 12.23.2014 Time: 3:00pm Location: [address] Dayroom   Group Topic: Communication, Team Building, Problem Solving  Goal Area(s) Addresses:  Patient will effectively work with peer towards shared goal.  Patient will identify skill used to make activity successful.  Patient will identify how skills used during activity can be used to reach post d/c goals.   Behavioral Response: Engaged, Attentive, Appropriate   Intervention: Problem Solving Activitiy  Activity: Life Boat. Patients were given a scenario about being on a sinking yacht. Patients were informed the yacht included 15 guest, 8 of which could be placed on the life boat, along with all group members. Individuals on guest list were of varying socioeconomic classes such as a Education officer, museum, Materials engineer, Midwife, Tree surgeon.   Education: Pharmacist, community, Discharge Planning   Education Outcome: Acknowledges understanding  Clinical Observations/Feedback: Patient actively engaged in group activity, voicing her opinion and debating with peers appropriately. Patient contributed to group discussion, relating the use of group skills to changing his "people, places and things" post d/c.    Marykay Lex Samik Balkcom, LRT/CTRS  Jearl Klinefelter 10/22/2013 4:40 PM

## 2013-10-22 NOTE — Progress Notes (Signed)
Attended AA group 

## 2013-10-22 NOTE — Progress Notes (Signed)
Patient ID: Chad Turner, male   DOB: 04-13-1961, 52 y.o.   MRN: [id number] He has been up and to part of the groups interacting with peers and staff. Self inventory: depression 7, hopeless 7, reported chilling and tremors but has not requested a prn. He denies SI thoughts. He has been encourage to attend groups . Continue to monitor .

## 2013-10-22 NOTE — Progress Notes (Signed)
D  Pt brightens on approach   He does endorse hopelessness and depression and feels like he is not ready to leave the hospital    He attends and participates in groups and interacts appropriately   He denies any withdrawal symptoms A   Verbal support given   Medications administered and effectiveness monitored   Q 15 min safety checks R   Pt safe at present

## 2013-10-22 NOTE — [facility] Group Notes (Signed)
[facility] LCSW Group Therapy  10/22/2013 1:27 PM  Type of Therapy:  Group Therapy  Participation Level:  Active  Participation Quality:  Attentive  Affect:  Appropriate  Cognitive:  Alert and Oriented  Insight:  Improving  Engagement in Therapy:  Engaged  Modes of Intervention:  Discussion, Education, Exploration, Rapport Building, Socialization and Support  Summary of Progress/Problems: MHA Speaker came to talk about his personal journey with substance abuse and addiction. The pt processed ways by which to relate to the speaker. MHA speaker provided handouts and educational information pertaining to groups and services offered by the [facility]. Azaiah was attentive and engaged throughout today's group. He actively listened as speaker shared his personal experience regarding SA and MI. Shade followed along as speaker reviewed various groups/services offered by [facility].    Smart, HeatherLCSWA 10/22/2013, 1:27 PM

## 2013-10-22 NOTE — Progress Notes (Signed)
Recreation Therapy Notes   Animal-Assisted Activity/Therapy (AAA/T) Program Checklist/Progress Notes Patient Eligibility Criteria Checklist & Daily Group note for Rec Tx Intervention  Date: 12.23.2014 Time: 2:30pm Location: [address]   AAA/T Program Assumption of Risk Form signed by Patient/ or Parent Legal Guardian yes  Patient is free of allergies or sever asthma yes  Patient reports no fear of animals yes  Patient reports no history of cruelty to animals yes   Patient understands his/her participation is voluntary yes  Behavioral Response: Did not attend.   Marykay Lex Karia Ehresman, LRT/CTRS  Jearl Klinefelter 10/22/2013 4:25 PM

## 2013-10-22 NOTE — Progress Notes (Signed)
The focus of this group is to educate the patient on the purpose and policies of crisis stabilization and provide a format to answer questions about their admission.  The group details unit policies and expectations of patients while admitted.  Patient did not attend group. 
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
## 2013-10-23 MED ORDER — TRAZODONE HCL 50 MG PO TABS: 50.0000 mg | ORAL_TABLET | Freq: Every evening | ORAL | Status: DC | PRN

## 2013-10-23 MED ORDER — LISINOPRIL-HYDROCHLOROTHIAZIDE 10-12.5 MG PO TABS: 1.0000 | ORAL_TABLET | Freq: Every day | ORAL | Status: DC

## 2013-10-23 NOTE — Tx Team (Signed)
Interdisciplinary Treatment Plan Update (Adult)  Date: 10/23/2013  Time Reviewed: 12:09 PM  Progress in Treatment:  Attending groups: Yes  Participating in groups:  Yes Taking medication as prescribed: Yes  Tolerating medication: Yes  Family/Significant othe contact made: No. SPE not required for this pt.  Patient understands diagnosis: Yes, AEB seeking treatment for ETOH detox and mood stabilization.  Discussing patient identified problems/goals with staff: Yes  Medical problems stabilized or resolved: Yes  Denies suicidal/homicidal ideation: Yes during admission/self reprot.  Patient has not harmed self or Others: Yes  New problem(s) identified:  Discharge Plan or Barriers: Pt going to [facility] on 12/25 at 1pm. NP (aggie) notified that pt needs 14 day med supply and inhaler for asthma. Contact person at [facility]: Melissa.  Additional comments: This is a 52 year old African-American male. Admitted to [facility] from the [facility] with complaints of alcohol withdrawal symptoms requesting detoxification treatment. Patient reports, "I know I was here a few months ago but I went back to drinking. I drink all day for the last few months. I start when I get up in the morning. Having cravings to drink now. I just can't keep on. I'm really ready to get help this time. Want to go back to rehab. I'm afraid I will ruin my health. I know it's not good what I do. I came to the ED because my abdomen started hurting from so much drinking. I don't eat that well." The patient appears depressed and anxious throughout the assessment but he is cooperative. He appears excessively worried about being able to turn his life around stating "If I go back out there I will just keep destroying myself." Reason for Continuation of Hospitalization: Librium taper-withdrawals Mood stabilization Estimated length of stay: 1 day ([facility] to transport pt to facility at 1pm Thursday 12/25).  For review of initial/current patient goals,  please see plan of care.  Attendees:  Patient:    Family:    Physician:Dr. Lolly Mustache MD 10/23/2013 12:08 PM   Nursing: Lupita Leash RN 10/23/2013 12:08 PM   Clinical Social Worker Sun Wilensky Smart, LCSWA  10/23/2013 12:08 PM   Other: Dahlia Client Nail LCSW 10/23/2013 12:08 PM   Other:    Other:     Other:    Scribe for Treatment Team:  [employer] LCSWA 10/23/2013 12:09 PM
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
## 2013-10-23 NOTE — Progress Notes (Signed)
Patient ID: Chad Turner, male   DOB: 01-30-1961, 52 y.o.   MRN: [id number] PER STATE REGULATIONS 482.30  THIS CHART WAS REVIEWED FOR MEDICAL NECESSITY WITH RESPECT TO THE PATIENT'S ADMISSION/ DURATION OF STAY.  NEXT REVIEW DATE: 10/26/2013  Willa Rough, RN, BSN CASE MANAGER

## 2013-10-23 NOTE — Progress Notes (Signed)
[facility] Adult Case Management Discharge Plan :  Will you be returning to the same living situation after discharge: No.[facility]  At discharge, do you have transportation home?:Yes,  [facility] coming to transport pt at 1PM Thursday Do you have the ability to pay for your medications:Yes,  medicare  Release of information consent forms completed submitted to Medical Records by CSW.   Patient to Follow up at: Follow-up Information   Follow up with [facility]. (If still interested in going to this facility after [facility], call [phone] to schedule screening for after you are discharged from [facility]. )    Contact information:   [address] [place], [place] [address] Phone: [phone] Fax: [phone]      Follow up with [facility]. (Walk in between 8am-9am Monday through Friday for hospital followup/medication management/assessment for therapy services.)    Contact information:   [address], [place] [address] Phone: [phone] Fax: [phone]      Follow up with [facility] On 10/24/2013. ([facility] will transport you to facility at 1:00PM. 14 day medication supply needed. )    Contact information:   [address] [place], [place] [address] Phone: [phone] Fax: [phone]      Patient denies SI/HI:   Yes,  during admission/self report/group    Safety Planning and Suicide Prevention discussed:  Yes,  SPE not required for this pt. He was provided with SPI pamphlet and encouraged to share information with his support network.   Smart, HeatherLCSWA  10/23/2013, 12:03 PM

## 2013-10-23 NOTE — Progress Notes (Signed)
Patient ID: Chad Turner, male   DOB: Aug 02, 1961, 52 y.o.   MRN: [id number] He has been up and to groups interacting with peers and staff. He refused to fill out self inventory. Stated that he was still depressed.
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
## 2013-10-23 NOTE — Progress Notes (Signed)
Attended Group

## 2013-10-23 NOTE — Progress Notes (Signed)
D: Pt denies SI/HI/AVH. Pt is pleasant and cooperative. Pt just ready to leave tomorrow.  A: Pt was offered support and encouragement. Pt was given scheduled medications. Pt was encourage to attend groups. Q 15 minute checks were done for safety.   R:Pt attends groups and interacts well with peers and staff. Pt is taking medication. Pt has no complaints at this time.Pt receptive to treatment and safety maintained on unit.

## 2013-10-23 NOTE — Progress Notes (Signed)
Patient ID: Chad Turner, male   DOB: 1961-05-21, 52 y.o.   MRN: [id number] Pt attended Pharmacy group.
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
## 2013-10-24 NOTE — Progress Notes (Signed)
Patient ID: Chad Turner, male   DOB: [birth year], 52 y.o.   MRN: [id number]  Pt was discharged to [facility], patient reported that he is not looking forward to going to [facility]. Pt reported that he feels like [facility] is jail. Pt reported being negative SI/HI, no AH/VH noted. Pt was given discharge instructions, no issues or concerns noted.

## 2013-10-29 NOTE — Progress Notes (Signed)
Patient Discharge Instructions:  After Visit Summary (AVS):   Faxed to:  10/29/13 Discharge Summary Note:   Faxed to:  10/29/13 Psychiatric Admission Assessment Note:   Faxed to:  10/29/13 Suicide Risk Assessment - Discharge Assessment:   Faxed to:  10/29/13 Faxed/Sent to the Next Level Care provider:  10/29/13 Faxed to [facility] @ [phone] Faxed to [facility] @ [phone] Faxed to [facility] @ [phone]  Chad Turner, 10/29/2013, 2:27 PM

## 2013-11-20 ENCOUNTER — Emergency Department (HOSPITAL_BASED_OUTPATIENT_CLINIC_OR_DEPARTMENT_OTHER)
Admission: EM | Admit: 2013-11-20 | Discharge: 2013-11-20 | Disposition: A | Discharge status: Home / Self Care | Payer: Medicare Other | Attending: Emergency Medicine | Admitting: Emergency Medicine

## 2013-11-20 ENCOUNTER — Encounter (HOSPITAL_BASED_OUTPATIENT_CLINIC_OR_DEPARTMENT_OTHER): Payer: Self-pay | Admitting: Emergency Medicine

## 2013-11-20 DIAGNOSIS — F329 Major depressive disorder, single episode, unspecified: Secondary | ICD-10-CM | POA: Insufficient documentation

## 2013-11-20 DIAGNOSIS — Z559 Problems related to education and literacy, unspecified: Secondary | ICD-10-CM | POA: Insufficient documentation

## 2013-11-20 DIAGNOSIS — R059 Cough, unspecified: Secondary | ICD-10-CM

## 2013-11-20 DIAGNOSIS — Z880 Allergy status to penicillin: Secondary | ICD-10-CM | POA: Insufficient documentation

## 2013-11-20 DIAGNOSIS — F3289 Other specified depressive episodes: Secondary | ICD-10-CM | POA: Insufficient documentation

## 2013-11-20 DIAGNOSIS — J45909 Unspecified asthma, uncomplicated: Secondary | ICD-10-CM | POA: Insufficient documentation

## 2013-11-20 DIAGNOSIS — Z79899 Other long term (current) drug therapy: Secondary | ICD-10-CM | POA: Insufficient documentation

## 2013-11-20 DIAGNOSIS — Z87828 Personal history of other (healed) physical injury and trauma: Secondary | ICD-10-CM | POA: Insufficient documentation

## 2013-11-20 DIAGNOSIS — I10 Essential (primary) hypertension: Secondary | ICD-10-CM | POA: Insufficient documentation

## 2013-11-20 DIAGNOSIS — R05 Cough: Secondary | ICD-10-CM

## 2013-11-20 MED ORDER — BENZONATATE 100 MG PO CAPS: 100.0000 mg | ORAL_CAPSULE | Freq: Three times a day (TID) | ORAL | Status: DC
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
## 2013-11-20 NOTE — ED Notes (Signed)
Pt in from [facility] with c/o cough and nasal congestion

## 2013-11-20 NOTE — Discharge Instructions (Signed)
If your cough does not improve after 7-10 days it may be secondary to your blood pressure medicine and he may need to consider changing your medicine.   Cough, Adult  A cough is a reflex that helps clear your throat and airways. It can help heal the body or may be a reaction to an irritated airway. A cough may only last 2 or 3 weeks (acute) or may last more than 8 weeks (chronic).  CAUSES Acute cough:  Viral or bacterial infections. Chronic cough:  Infections.  Allergies.  Asthma.  Post-nasal drip.  Smoking.  Heartburn or acid reflux.  Some medicines.  Chronic lung problems (COPD).  Cancer. SYMPTOMS   Cough.  Fever.  Chest pain.  Increased breathing rate.  High-pitched whistling sound when breathing (wheezing).  Colored mucus that you cough up (sputum). TREATMENT   A bacterial cough may be treated with antibiotic medicine.  A viral cough must run its course and will not respond to antibiotics.  Your caregiver may recommend other treatments if you have a chronic cough. HOME CARE INSTRUCTIONS   Only take over-the-counter or prescription medicines for pain, discomfort, or fever as directed by your caregiver. Use cough suppressants only as directed by your caregiver.  Use a cold steam vaporizer or humidifier in your bedroom or home to help loosen secretions.  Sleep in a semi-upright position if your cough is worse at night.  Rest as needed.  Stop smoking if you smoke. SEEK IMMEDIATE MEDICAL CARE IF:   You have pus in your sputum.  Your cough starts to worsen.  You cannot control your cough with suppressants and are losing sleep.  You begin coughing up blood.  You have difficulty breathing.  You develop pain which is getting worse or is uncontrolled with medicine.  You have a fever. MAKE SURE YOU:   Understand these instructions.  Will watch your condition.  Will get help right away if you are not doing well or get worse. Document Released:  04/15/2011 Document Revised: 01/09/2012 Document Reviewed: 04/15/2011 [facility] Patient Information 2014 [place].

## 2013-12-03 ENCOUNTER — Encounter (HOSPITAL_BASED_OUTPATIENT_CLINIC_OR_DEPARTMENT_OTHER): Payer: Self-pay | Admitting: Emergency Medicine

## 2013-12-03 ENCOUNTER — Emergency Department (HOSPITAL_BASED_OUTPATIENT_CLINIC_OR_DEPARTMENT_OTHER)
Admission: EM | Admit: 2013-12-03 | Discharge: 2013-12-03 | Disposition: A | Discharge status: Home / Self Care | Payer: Medicare Other | Attending: Emergency Medicine | Admitting: Emergency Medicine

## 2013-12-03 DIAGNOSIS — I10 Essential (primary) hypertension: Secondary | ICD-10-CM | POA: Insufficient documentation

## 2013-12-03 DIAGNOSIS — Z87828 Personal history of other (healed) physical injury and trauma: Secondary | ICD-10-CM | POA: Insufficient documentation

## 2013-12-03 DIAGNOSIS — J45909 Unspecified asthma, uncomplicated: Secondary | ICD-10-CM | POA: Insufficient documentation

## 2013-12-03 DIAGNOSIS — Z559 Problems related to education and literacy, unspecified: Secondary | ICD-10-CM | POA: Insufficient documentation

## 2013-12-03 DIAGNOSIS — F1021 Alcohol dependence, in remission: Secondary | ICD-10-CM | POA: Insufficient documentation

## 2013-12-03 DIAGNOSIS — T465X5A Adverse effect of other antihypertensive drugs, initial encounter: Secondary | ICD-10-CM | POA: Insufficient documentation

## 2013-12-03 DIAGNOSIS — R05 Cough: Secondary | ICD-10-CM

## 2013-12-03 DIAGNOSIS — R058 Adverse effect of angiotensin-converting-enzyme inhibitors, initial encounter: Secondary | ICD-10-CM

## 2013-12-03 DIAGNOSIS — F3289 Other specified depressive episodes: Secondary | ICD-10-CM | POA: Insufficient documentation

## 2013-12-03 DIAGNOSIS — Z880 Allergy status to penicillin: Secondary | ICD-10-CM | POA: Insufficient documentation

## 2013-12-03 DIAGNOSIS — F329 Major depressive disorder, single episode, unspecified: Secondary | ICD-10-CM | POA: Insufficient documentation

## 2013-12-03 DIAGNOSIS — R059 Cough, unspecified: Secondary | ICD-10-CM | POA: Insufficient documentation

## 2013-12-03 DIAGNOSIS — T464X5A Adverse effect of angiotensin-converting-enzyme inhibitors, initial encounter: Secondary | ICD-10-CM

## 2013-12-03 DIAGNOSIS — Z79899 Other long term (current) drug therapy: Secondary | ICD-10-CM | POA: Insufficient documentation

## 2013-12-03 MED ORDER — BENZONATATE 100 MG PO CAPS: 100.0000 mg | ORAL_CAPSULE | Freq: Three times a day (TID) | ORAL | Status: DC

## 2013-12-03 MED ORDER — METOPROLOL TARTRATE 25 MG PO TABS: 25.0000 mg | ORAL_TABLET | Freq: Two times a day (BID) | ORAL | Status: DC

## 2013-12-03 NOTE — Discharge Instructions (Signed)
Stop your lisinopril. Start metoprolol as prescribed.  Tessalon Perles as needed for cough.  Followup with your primary Dr. for a recheck of your blood pressure in the next week.   Cough, Adult  A cough is a reflex that helps clear your throat and airways. It can help heal the body or may be a reaction to an irritated airway. A cough may only last 2 or 3 weeks (acute) or may last more than 8 weeks (chronic).  CAUSES Acute cough:  Viral or bacterial infections. Chronic cough:  Infections.  Allergies.  Asthma.  Post-nasal drip.  Smoking.  Heartburn or acid reflux.  Some medicines.  Chronic lung problems (COPD).  Cancer. SYMPTOMS   Cough.  Fever.  Chest pain.  Increased breathing rate.  High-pitched whistling sound when breathing (wheezing).  Colored mucus that you cough up (sputum). TREATMENT   A bacterial cough may be treated with antibiotic medicine.  A viral cough must run its course and will not respond to antibiotics.  Your caregiver may recommend other treatments if you have a chronic cough. HOME CARE INSTRUCTIONS   Only take over-the-counter or prescription medicines for pain, discomfort, or fever as directed by your caregiver. Use cough suppressants only as directed by your caregiver.  Use a cold steam vaporizer or humidifier in your bedroom or home to help loosen secretions.  Sleep in a semi-upright position if your cough is worse at night.  Rest as needed.  Stop smoking if you smoke. SEEK IMMEDIATE MEDICAL CARE IF:   You have pus in your sputum.  Your cough starts to worsen.  You cannot control your cough with suppressants and are losing sleep.  You begin coughing up blood.  You have difficulty breathing.  You develop pain which is getting worse or is uncontrolled with medicine.  You have a fever. MAKE SURE YOU:   Understand these instructions.  Will watch your condition.  Will get help right away if you are not doing well or  get worse. Document Released: 04/15/2011 Document Revised: 01/09/2012 Document Reviewed: 04/15/2011 [facility] Patient Information 2014 [place].

## 2013-12-03 NOTE — ED Notes (Signed)
Pt here from [facility] for congestion and sore throat
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
## 2014-07-10 ENCOUNTER — Other Ambulatory Visit: Payer: Self-pay | Admitting: Orthopaedic Surgery

## 2014-07-28 ENCOUNTER — Encounter (HOSPITAL_COMMUNITY): Payer: Self-pay

## 2014-07-31 ENCOUNTER — Inpatient Hospital Stay (HOSPITAL_COMMUNITY): Admission: RE | Admit: 2014-07-31 | Payer: Self-pay | Source: Ambulatory Visit

## 2014-08-05 NOTE — Pre-Procedure Instructions (Addendum)
Chad Turner  08/05/2014   Your procedure is scheduled on:  Tuesday, October 13.  Report to [facility] Admitting at 10:30 AM.  Call this number if you have problems the morning of surgery: [phone]   Remember:   Do not eat food or drink liquids after midnight Monday, October 12.   Take these medicines the morning of surgery with A SIP OF WATER: amLODipine (NORVASC).   Do not wear jewelry, make-up or nail polish.  Do not wear lotions, powders, or perfumes.               Men may shave face and neck.  Do not bring valuables to the hospital.              [facility] is not responsible for any belongings or valuables.               Contacts, dentures or bridgework may not be worn into surgery.  Leave suitcase in the car. After surgery it may be brought to your room.  For patients admitted to the hospital, discharge time is determined by your treatment team.                 Special Instructions: Review  [place] - Preparing For Surgery.   Please read over the following fact sheets that you were given: Pain Booklet, Coughing and Deep Breathing, Blood Transfusion Information and Surgical Site Infection Prevention and Incentive Spirometry.

## 2014-08-06 ENCOUNTER — Encounter (HOSPITAL_COMMUNITY): Payer: Self-pay

## 2014-08-06 ENCOUNTER — Encounter (HOSPITAL_COMMUNITY)
Admission: RE | Admit: 2014-08-06 | Discharge: 2014-08-06 | Disposition: A | Discharge status: Home / Self Care | Payer: Medicare Other | Source: Ambulatory Visit | Attending: Orthopaedic Surgery | Admitting: Orthopaedic Surgery

## 2014-08-06 ENCOUNTER — Ambulatory Visit (HOSPITAL_COMMUNITY)
Admission: RE | Admit: 2014-08-06 | Discharge: 2014-08-06 | Disposition: A | Discharge status: Home / Self Care | Payer: Medicare Other | Source: Ambulatory Visit | Attending: Orthopaedic Surgery | Admitting: Orthopaedic Surgery

## 2014-08-06 DIAGNOSIS — Z01818 Encounter for other preprocedural examination: Secondary | ICD-10-CM | POA: Diagnosis present

## 2014-08-06 DIAGNOSIS — M179 Osteoarthritis of knee, unspecified: Secondary | ICD-10-CM | POA: Diagnosis not present

## 2014-08-06 LAB — CBC WITH DIFFERENTIAL/PLATELET
Basophils Absolute: 0 10*3/uL (ref 0.0–0.1)
Basophils Relative: 1 % (ref 0–1)
Eosinophils Absolute: 0.1 10*3/uL (ref 0.0–0.7)
Eosinophils Relative: 1 % (ref 0–5)
HCT: 41.6 % (ref 39.0–52.0)
Hemoglobin: 14.4 g/dL (ref 13.0–17.0)
Lymphocytes Relative: 29 % (ref 12–46)
Lymphs Abs: 1.6 10*3/uL (ref 0.7–4.0)
MCH: 29.6 pg (ref 26.0–34.0)
MCHC: 34.6 g/dL (ref 30.0–36.0)
MCV: 85.6 fL (ref 78.0–100.0)
Monocytes Absolute: 1 10*3/uL (ref 0.1–1.0)
Monocytes Relative: 19 % — ABNORMAL HIGH (ref 3–12)
Neutro Abs: 2.8 10*3/uL (ref 1.7–7.7)
Neutrophils Relative %: 50 % (ref 43–77)
Platelets: 241 10*3/uL (ref 150–400)
RBC: 4.86 MIL/uL (ref 4.22–5.81)
RDW: 13.2 % (ref 11.5–15.5)
WBC: 5.6 10*3/uL (ref 4.0–10.5)

## 2014-08-06 LAB — PROTIME-INR
INR: 1.05 (ref 0.00–1.49)
Prothrombin Time: 13.7 seconds (ref 11.6–15.2)

## 2014-08-06 LAB — BASIC METABOLIC PANEL
Anion gap: 18 — ABNORMAL HIGH (ref 5–15)
BUN: 4 mg/dL — ABNORMAL LOW (ref 6–23)
CO2: 23 mEq/L (ref 19–32)
Calcium: 9.2 mg/dL (ref 8.4–10.5)
Chloride: 95 mEq/L — ABNORMAL LOW (ref 96–112)
Creatinine, Ser: 0.78 mg/dL (ref 0.50–1.35)
GFR calc Af Amer: >90 mL/min (ref >90)
GFR calc non Af Amer: >90 mL/min (ref >90)
Glucose, Bld: 110 mg/dL — ABNORMAL HIGH (ref 70–99)
Potassium: 3.6 mEq/L — ABNORMAL LOW (ref 3.7–5.3)
Sodium: 136 mEq/L — ABNORMAL LOW (ref 137–147)

## 2014-08-06 LAB — TYPE AND SCREEN
ABO/RH(D): O POS
Antibody Screen: NEGATIVE

## 2014-08-06 LAB — URINALYSIS, ROUTINE W REFLEX MICROSCOPIC
Bilirubin Urine: NEGATIVE
Glucose, UA: NEGATIVE mg/dL
Hgb urine dipstick: NEGATIVE
Ketones, ur: NEGATIVE mg/dL
Leukocytes, UA: NEGATIVE
Nitrite: NEGATIVE
Protein, ur: NEGATIVE mg/dL
Specific Gravity, Urine: 1.012 (ref 1.005–1.030)
Urobilinogen, UA: 1 mg/dL (ref 0.0–1.0)
pH: 5.5 (ref 5.0–8.0)

## 2014-08-06 LAB — SURGICAL PCR SCREEN
MRSA, PCR: NEGATIVE
Staphylococcus aureus: POSITIVE — AB

## 2014-08-06 LAB — APTT: aPTT: 34 seconds (ref 24–37)

## 2014-08-06 LAB — ABO/RH: ABO/RH(D): O POS

## 2014-08-06 NOTE — Progress Notes (Signed)
08/06/14 0918  OBSTRUCTIVE SLEEP APNEA  Score 4 or greater  Results sent to PCP

## 2014-08-06 NOTE — Progress Notes (Signed)
Pt called and informed of positive staph results script called to [place] on [facility].

## 2014-08-06 NOTE — Progress Notes (Addendum)
PCP is [facility] Denies seeing a cardiologist Denies having a recent CXR or EKG. Denies ever having a stress test, echo, or card cath. History of Asthma noted, but pt denies having any problems at present time.

## 2014-08-07 ENCOUNTER — Encounter (HOSPITAL_COMMUNITY): Payer: Self-pay
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
## 2014-08-11 MED ORDER — VANCOMYCIN HCL IN DEXTROSE 1-5 GM/200ML-% IV SOLN
1000.0000 mg | INTRAVENOUS | Status: AC
  Administered 2014-08-12: 1000 mg via INTRAVENOUS
  Filled 2014-08-11: qty 200

## 2014-08-12 ENCOUNTER — Encounter (HOSPITAL_COMMUNITY): Payer: Self-pay | Admitting: *Deleted

## 2014-08-12 ENCOUNTER — Encounter (HOSPITAL_COMMUNITY)
Admission: RE | Disposition: A | Discharge status: Home Health | Payer: Self-pay | Source: Ambulatory Visit | Attending: Orthopaedic Surgery

## 2014-08-12 ENCOUNTER — Encounter (HOSPITAL_COMMUNITY): Payer: Medicare Other | Admitting: Vascular Surgery

## 2014-08-12 ENCOUNTER — Inpatient Hospital Stay (HOSPITAL_COMMUNITY): Payer: Medicare Other | Admitting: Certified Registered"

## 2014-08-12 ENCOUNTER — Inpatient Hospital Stay (HOSPITAL_COMMUNITY)
Admission: RE | Admit: 2014-08-12 | Discharge: 2014-08-14 | DRG: 470 | Disposition: A | Discharge status: Home Health | Payer: Medicare Other | Source: Ambulatory Visit | Attending: Orthopaedic Surgery | Admitting: Orthopaedic Surgery

## 2014-08-12 DIAGNOSIS — M1712 Unilateral primary osteoarthritis, left knee: Secondary | ICD-10-CM | POA: Diagnosis present

## 2014-08-12 DIAGNOSIS — Z880 Allergy status to penicillin: Secondary | ICD-10-CM

## 2014-08-12 DIAGNOSIS — R110 Nausea: Secondary | ICD-10-CM | POA: Diagnosis not present

## 2014-08-12 DIAGNOSIS — F329 Major depressive disorder, single episode, unspecified: Secondary | ICD-10-CM | POA: Diagnosis present

## 2014-08-12 DIAGNOSIS — I252 Old myocardial infarction: Secondary | ICD-10-CM | POA: Diagnosis not present

## 2014-08-12 DIAGNOSIS — I10 Essential (primary) hypertension: Secondary | ICD-10-CM | POA: Diagnosis present

## 2014-08-12 DIAGNOSIS — F1410 Cocaine abuse, uncomplicated: Secondary | ICD-10-CM | POA: Diagnosis present

## 2014-08-12 DIAGNOSIS — M179 Osteoarthritis of knee, unspecified: Secondary | ICD-10-CM | POA: Diagnosis present

## 2014-08-12 DIAGNOSIS — F1020 Alcohol dependence, uncomplicated: Secondary | ICD-10-CM | POA: Diagnosis present

## 2014-08-12 DIAGNOSIS — J45909 Unspecified asthma, uncomplicated: Secondary | ICD-10-CM | POA: Diagnosis present

## 2014-08-12 HISTORY — PX: TOTAL KNEE ARTHROPLASTY: SHX125

## 2014-08-12 LAB — HEPATIC FUNCTION PANEL
ALT: 44 U/L (ref 0–53)
AST: 70 U/L — ABNORMAL HIGH (ref 0–37)
Albumin: 3.8 g/dL (ref 3.5–5.2)
Alkaline Phosphatase: 116 U/L (ref 39–117)
Bilirubin, Direct: <0.2 mg/dL (ref 0.0–0.3)
Total Bilirubin: 0.8 mg/dL (ref 0.3–1.2)
Total Protein: 8.8 g/dL — ABNORMAL HIGH (ref 6.0–8.3)

## 2014-08-12 SURGERY — ARTHROPLASTY, KNEE, TOTAL
Anesthesia: Spinal | Site: Knee | Laterality: Left

## 2014-08-12 MED ORDER — BUPIVACAINE IN DEXTROSE 0.75-8.25 % IT SOLN
INTRATHECAL | Status: DC | PRN
  Administered 2014-08-12: 2 mL via INTRATHECAL

## 2014-08-12 MED ORDER — FENTANYL CITRATE 0.05 MG/ML IJ SOLN
INTRAMUSCULAR | Status: DC | PRN
  Administered 2014-08-12 (×5): 50 ug via INTRAVENOUS

## 2014-08-12 MED ORDER — FENTANYL CITRATE 0.05 MG/ML IJ SOLN
INTRAMUSCULAR | Status: AC
  Filled 2014-08-12: qty 5

## 2014-08-12 MED ORDER — HYDROCHLOROTHIAZIDE 25 MG PO TABS
12.5000 mg | ORAL_TABLET | Freq: Every day | ORAL | Status: DC
  Filled 2014-08-12 (×2): qty 0.5

## 2014-08-12 MED ORDER — ACETAMINOPHEN 650 MG RE SUPP: 650.0000 mg | Freq: Four times a day (QID) | RECTAL | Status: DC | PRN

## 2014-08-12 MED ORDER — 0.9 % SODIUM CHLORIDE (POUR BTL) OPTIME
TOPICAL | Status: DC | PRN
  Administered 2014-08-12: 1000 mL

## 2014-08-12 MED ORDER — PHENOL 1.4 % MT LIQD: 1.0000 | OROMUCOSAL | Status: DC | PRN

## 2014-08-12 MED ORDER — PROPOFOL INFUSION 10 MG/ML OPTIME
INTRAVENOUS | Status: DC | PRN
  Administered 2014-08-12: 25 ug/kg/min via INTRAVENOUS

## 2014-08-12 MED ORDER — LACTATED RINGERS IV SOLN: INTRAVENOUS | Status: DC

## 2014-08-12 MED ORDER — SODIUM CHLORIDE 0.9 % IR SOLN
Status: DC | PRN
  Administered 2014-08-12: 2000 mL

## 2014-08-12 MED ORDER — HYDROMORPHONE HCL 1 MG/ML IJ SOLN: 0.2500 mg | INTRAMUSCULAR | Status: DC | PRN

## 2014-08-12 MED ORDER — BUPIVACAINE LIPOSOME 1.3 % IJ SUSP
20.0000 mL | Freq: Once | INTRAMUSCULAR | Status: DC
  Filled 2014-08-12: qty 20

## 2014-08-12 MED ORDER — BISACODYL 5 MG PO TBEC: 5.0000 mg | DELAYED_RELEASE_TABLET | Freq: Every day | ORAL | Status: DC | PRN

## 2014-08-12 MED ORDER — HYDROCODONE-ACETAMINOPHEN 5-325 MG PO TABS
1.0000 | ORAL_TABLET | ORAL | Status: DC | PRN
  Administered 2014-08-12: 1 via ORAL
  Administered 2014-08-12: 2 via ORAL
  Administered 2014-08-12: 1 via ORAL
  Administered 2014-08-13 – 2014-08-14 (×7): 2 via ORAL
  Filled 2014-08-12 (×2): qty 2
  Filled 2014-08-12: qty 1
  Filled 2014-08-12: qty 2
  Filled 2014-08-12: qty 1
  Filled 2014-08-12 (×5): qty 2

## 2014-08-12 MED ORDER — TRANEXAMIC ACID 100 MG/ML IV SOLN
2000.0000 mg | INTRAVENOUS | Status: DC
  Filled 2014-08-12: qty 20

## 2014-08-12 MED ORDER — METHOCARBAMOL 1000 MG/10ML IJ SOLN
500.0000 mg | Freq: Four times a day (QID) | INTRAVENOUS | Status: DC | PRN
  Filled 2014-08-12: qty 5

## 2014-08-12 MED ORDER — METHOCARBAMOL 500 MG PO TABS
500.0000 mg | ORAL_TABLET | Freq: Four times a day (QID) | ORAL | Status: DC | PRN
Start: 2014-08-12 — End: 2014-08-14
  Administered 2014-08-12 – 2014-08-14 (×7): 500 mg via ORAL
  Filled 2014-08-12 (×10): qty 1

## 2014-08-12 MED ORDER — PROPOFOL 10 MG/ML IV BOLUS
INTRAVENOUS | Status: AC
Start: 2014-08-12 — End: 2014-08-12
  Filled 2014-08-12: qty 20

## 2014-08-12 MED ORDER — MIDAZOLAM HCL 2 MG/2ML IJ SOLN
INTRAMUSCULAR | Status: AC
  Filled 2014-08-12: qty 2

## 2014-08-12 MED ORDER — PROPOFOL 10 MG/ML IV BOLUS
INTRAVENOUS | Status: AC
  Filled 2014-08-12: qty 20

## 2014-08-12 MED ORDER — ONDANSETRON HCL 4 MG PO TABS
4.0000 mg | ORAL_TABLET | Freq: Four times a day (QID) | ORAL | Status: DC | PRN
  Administered 2014-08-12: 4 mg via ORAL
  Filled 2014-08-12: qty 1

## 2014-08-12 MED ORDER — MENTHOL 3 MG MT LOZG: 1.0000 | LOZENGE | OROMUCOSAL | Status: DC | PRN

## 2014-08-12 MED ORDER — PHENYLEPHRINE HCL 10 MG/ML IJ SOLN
INTRAMUSCULAR | Status: DC | PRN
  Administered 2014-08-12 (×2): 80 ug via INTRAVENOUS

## 2014-08-12 MED ORDER — TRANEXAMIC ACID 100 MG/ML IV SOLN
2000.0000 mg | INTRAVENOUS | Status: DC | PRN
  Administered 2014-08-12: 2000 mg via INTRAVENOUS

## 2014-08-12 MED ORDER — MIDAZOLAM HCL 5 MG/5ML IJ SOLN
INTRAMUSCULAR | Status: DC | PRN
  Administered 2014-08-12 (×2): 1 mg via INTRAVENOUS

## 2014-08-12 MED ORDER — ROPIVACAINE HCL 5 MG/ML IJ SOLN
INTRAMUSCULAR | Status: DC | PRN
  Administered 2014-08-12: 20 mL via PERINEURAL

## 2014-08-12 MED ORDER — ASPIRIN EC 325 MG PO TBEC
325.0000 mg | DELAYED_RELEASE_TABLET | Freq: Two times a day (BID) | ORAL | Status: DC
  Administered 2014-08-13 – 2014-08-14 (×3): 325 mg via ORAL
  Filled 2014-08-12 (×5): qty 1

## 2014-08-12 MED ORDER — OXYCODONE HCL 5 MG/5ML PO SOLN: 5.0000 mg | Freq: Once | ORAL | Status: DC | PRN

## 2014-08-12 MED ORDER — ALUM & MAG HYDROXIDE-SIMETH 200-200-20 MG/5ML PO SUSP
30.0000 mL | ORAL | Status: DC | PRN
Start: 2014-08-12 — End: 2014-08-14

## 2014-08-12 MED ORDER — ACETAMINOPHEN 325 MG PO TABS
650.0000 mg | ORAL_TABLET | Freq: Four times a day (QID) | ORAL | Status: DC | PRN
  Administered 2014-08-13 (×2): 650 mg via ORAL
  Filled 2014-08-12 (×2): qty 2

## 2014-08-12 MED ORDER — BUPIVACAINE LIPOSOME 1.3 % IJ SUSP
INTRAMUSCULAR | Status: DC | PRN
  Administered 2014-08-12: 20 mL

## 2014-08-12 MED ORDER — TRAZODONE HCL 50 MG PO TABS
50.0000 mg | ORAL_TABLET | Freq: Every evening | ORAL | Status: DC | PRN
  Administered 2014-08-12 – 2014-08-13 (×2): 50 mg via ORAL
  Filled 2014-08-12 (×2): qty 1

## 2014-08-12 MED ORDER — VANCOMYCIN HCL IN DEXTROSE 1-5 GM/200ML-% IV SOLN
1000.0000 mg | Freq: Two times a day (BID) | INTRAVENOUS | Status: AC
  Administered 2014-08-12: 1000 mg via INTRAVENOUS
  Filled 2014-08-12 (×2): qty 200

## 2014-08-12 MED ORDER — OXYCODONE HCL 5 MG PO TABS: 5.0000 mg | ORAL_TABLET | Freq: Once | ORAL | Status: DC | PRN

## 2014-08-12 MED ORDER — METOCLOPRAMIDE HCL 5 MG/ML IJ SOLN
5.0000 mg | Freq: Three times a day (TID) | INTRAMUSCULAR | Status: DC | PRN
  Administered 2014-08-12: 10 mg via INTRAVENOUS
  Filled 2014-08-12: qty 2

## 2014-08-12 MED ORDER — LACTATED RINGERS IV SOLN
INTRAVENOUS | Status: DC
  Administered 2014-08-12: 11:00:00 via INTRAVENOUS

## 2014-08-12 MED ORDER — ONDANSETRON HCL 4 MG/2ML IJ SOLN
4.0000 mg | Freq: Four times a day (QID) | INTRAMUSCULAR | Status: DC | PRN
  Administered 2014-08-12: 4 mg via INTRAVENOUS
  Filled 2014-08-12 (×2): qty 2

## 2014-08-12 MED ORDER — AMLODIPINE BESYLATE 10 MG PO TABS
10.0000 mg | ORAL_TABLET | Freq: Every day | ORAL | Status: DC
  Administered 2014-08-13 – 2014-08-14 (×2): 10 mg via ORAL
  Filled 2014-08-12 (×3): qty 1

## 2014-08-12 MED ORDER — ONDANSETRON HCL 4 MG/2ML IJ SOLN
INTRAMUSCULAR | Status: DC | PRN
  Administered 2014-08-12: 4 mg via INTRAVENOUS

## 2014-08-12 MED ORDER — CHLORHEXIDINE GLUCONATE 4 % EX LIQD: 60.0000 mL | Freq: Once | CUTANEOUS | Status: DC

## 2014-08-12 MED ORDER — METOCLOPRAMIDE HCL 5 MG PO TABS
5.0000 mg | ORAL_TABLET | Freq: Three times a day (TID) | ORAL | Status: DC | PRN
Start: 2014-08-12 — End: 2014-08-14
  Filled 2014-08-12: qty 2

## 2014-08-12 MED ORDER — DIPHENHYDRAMINE HCL 12.5 MG/5ML PO ELIX
12.5000 mg | ORAL_SOLUTION | ORAL | Status: DC | PRN
  Administered 2014-08-13: 25 mg via ORAL
  Filled 2014-08-12: qty 10

## 2014-08-12 MED ORDER — DOCUSATE SODIUM 100 MG PO CAPS
100.0000 mg | ORAL_CAPSULE | Freq: Two times a day (BID) | ORAL | Status: DC
  Administered 2014-08-12 – 2014-08-14 (×4): 100 mg via ORAL
  Filled 2014-08-12 (×6): qty 1

## 2014-08-12 MED ORDER — HYDROMORPHONE HCL 1 MG/ML IJ SOLN
0.5000 mg | INTRAMUSCULAR | Status: DC | PRN
  Administered 2014-08-12 – 2014-08-14 (×6): 1 mg via INTRAVENOUS
  Filled 2014-08-12 (×6): qty 1

## 2014-08-12 MED ORDER — LACTATED RINGERS IV SOLN
INTRAVENOUS | Status: DC | PRN
  Administered 2014-08-12 (×2): via INTRAVENOUS

## 2014-08-12 SURGICAL SUPPLY — 58 items
BANDAGE ELASTIC 4 VELCRO ST LF (GAUZE/BANDAGES/DRESSINGS) ×2 IMPLANT
BANDAGE ELASTIC 6 VELCRO ST LF (GAUZE/BANDAGES/DRESSINGS) ×2 IMPLANT
BANDAGE ESMARK 6X9 LF (GAUZE/BANDAGES/DRESSINGS) ×1 IMPLANT
BLADE SAGITTAL 25.0X1.19X90 (BLADE) IMPLANT
BLADE SURG ROTATE 9660 (MISCELLANEOUS) IMPLANT
BNDG ESMARK 6X9 LF (GAUZE/BANDAGES/DRESSINGS) ×2
BNDG GAUZE ELAST 4 BULKY (GAUZE/BANDAGES/DRESSINGS) ×2 IMPLANT
BOWL SMART MIX CTS (DISPOSABLE) ×2 IMPLANT
CAP UPCHARGE REVISION TRAY ×2 IMPLANT
CAPT RP KNEE ×2 IMPLANT
CEMENT HV SMART SET (Cement) ×4 IMPLANT
COVER SURGICAL LIGHT HANDLE (MISCELLANEOUS) ×2 IMPLANT
CUFF TOURNIQUET SINGLE 34IN LL (TOURNIQUET CUFF) ×2 IMPLANT
DRAPE EXTREMITY T 121X128X90 (DRAPE) ×2 IMPLANT
DRAPE PROXIMA HALF (DRAPES) ×2 IMPLANT
DRAPE U-SHAPE 47X51 STRL (DRAPES) ×2 IMPLANT
DRSG ADAPTIC 3X8 NADH LF (GAUZE/BANDAGES/DRESSINGS) ×2 IMPLANT
DRSG PAD ABDOMINAL 8X10 ST (GAUZE/BANDAGES/DRESSINGS) ×2 IMPLANT
DURAPREP 26ML APPLICATOR (WOUND CARE) ×2 IMPLANT
ELECT REM PT RETURN 9FT ADLT (ELECTROSURGICAL) ×2
ELECTRODE REM PT RTRN 9FT ADLT (ELECTROSURGICAL) ×1 IMPLANT
GAUZE SPONGE 4X4 12PLY STRL (GAUZE/BANDAGES/DRESSINGS) ×2 IMPLANT
GLOVE BIO SURGEON STRL SZ8 (GLOVE) ×4 IMPLANT
GLOVE BIOGEL M 6.5 STRL (GLOVE) ×2 IMPLANT
GLOVE BIOGEL M 7.0 STRL (GLOVE) ×2 IMPLANT
GLOVE BIOGEL PI IND STRL 7.0 (GLOVE) ×1 IMPLANT
GLOVE BIOGEL PI IND STRL 8 (GLOVE) ×2 IMPLANT
GLOVE BIOGEL PI INDICATOR 7.0 (GLOVE) ×1
GLOVE BIOGEL PI INDICATOR 8 (GLOVE) ×2
GLOVE NEODERM STER SZ 7 (GLOVE) ×2 IMPLANT
GOWN STRL REUS W/ TWL LRG LVL3 (GOWN DISPOSABLE) ×2 IMPLANT
GOWN STRL REUS W/ TWL XL LVL3 (GOWN DISPOSABLE) ×2 IMPLANT
GOWN STRL REUS W/TWL LRG LVL3 (GOWN DISPOSABLE) ×2
GOWN STRL REUS W/TWL XL LVL3 (GOWN DISPOSABLE) ×2
HANDPIECE INTERPULSE COAX TIP (DISPOSABLE) ×1
HOOD PEEL AWAY FACE SHEILD DIS (HOOD) ×6 IMPLANT
IMMOBILIZER KNEE 22 UNIV (SOFTGOODS) ×2 IMPLANT
KIT BASIN OR (CUSTOM PROCEDURE TRAY) ×2 IMPLANT
KIT ROOM TURNOVER OR (KITS) ×2 IMPLANT
MANIFOLD NEPTUNE II (INSTRUMENTS) ×2 IMPLANT
NEEDLE HYPO 21X1 ECLIPSE (NEEDLE) ×2 IMPLANT
NS IRRIG 1000ML POUR BTL (IV SOLUTION) ×2 IMPLANT
PACK TOTAL JOINT (CUSTOM PROCEDURE TRAY) ×2 IMPLANT
PAD ABD 8X10 STRL (GAUZE/BANDAGES/DRESSINGS) ×2 IMPLANT
PAD ARMBOARD 7.5X6 YLW CONV (MISCELLANEOUS) ×4 IMPLANT
SET HNDPC FAN SPRY TIP SCT (DISPOSABLE) ×1 IMPLANT
SPONGE GAUZE 4X4 12PLY STER LF (GAUZE/BANDAGES/DRESSINGS) ×2 IMPLANT
STAPLER VISISTAT 35W (STAPLE) IMPLANT
SUCTION FRAZIER TIP 10 FR DISP (SUCTIONS) IMPLANT
SUT MNCRL AB 3-0 PS2 18 (SUTURE) IMPLANT
SUT VIC AB 0 CT1 27 (SUTURE) ×2
SUT VIC AB 0 CT1 27XBRD ANBCTR (SUTURE) ×2 IMPLANT
SUT VIC AB 2-0 CT1 27 (SUTURE) ×2
SUT VIC AB 2-0 CT1 TAPERPNT 27 (SUTURE) ×2 IMPLANT
SUT VLOC 180 0 24IN GS25 (SUTURE) ×2 IMPLANT
SYR 50ML LL SCALE MARK (SYRINGE) ×2 IMPLANT
TOWEL OR 17X24 6PK STRL BLUE (TOWEL DISPOSABLE) ×2 IMPLANT
TOWEL OR 17X26 10 PK STRL BLUE (TOWEL DISPOSABLE) ×2 IMPLANT

## 2014-08-12 NOTE — Anesthesia Postprocedure Evaluation (Signed)
  Anesthesia Post-op Note  Patient: Chad Turner  Procedure(s) Performed: Procedure(s): TOTAL KNEE ARTHROPLASTY (Left)  Patient Location: PACU  Anesthesia Type:Regional and Spinal  Level of Consciousness: awake  Airway and Oxygen Therapy: Patient Spontanous Breathing  Post-op Pain: mild  Post-op Assessment: Post-op Vital signs reviewed, Patient's Cardiovascular Status Stable, Respiratory Function Stable, Patent Airway, No signs of Nausea or vomiting and Pain level controlled  Post-op Vital Signs: Reviewed and stable  Last Vitals:  Filed Vitals:   08/12/14 1450  BP: 138/84  Pulse: 95  Temp: 36.6 C  Resp: 18    Complications: No apparent anesthesia complications

## 2014-08-12 NOTE — Anesthesia Preprocedure Evaluation (Addendum)
Anesthesia Evaluation  Patient identified by MRN, date of birth, ID band Patient awake    Reviewed: Allergy & Precautions, H&P , NPO status , Patient's Chart, lab work & pertinent test results  History of Anesthesia Complications Negative for: history of anesthetic complications  Airway Mallampati: II TM Distance: >3 FB Neck ROM: Full    Dental  (+) Missing, Dental Advisory Given,    Pulmonary asthma , neg sleep apnea, neg COPDneg recent URI,  breath sounds clear to auscultation        Cardiovascular hypertension, Pt. on medications - angina- Past MI and - CHF - dysrhythmias - Valvular Problems/MurmursRhythm:Regular     Neuro/Psych Depression Alcohol dependence    Cocaine abuse    Alcohol abuse with alcohol-induced disorder    Substance induced mood disorder  negative neurological ROS     GI/Hepatic negative GI ROS, Neg liver ROS,   Endo/Other  negative endocrine ROS  Renal/GU negative Renal ROS     Musculoskeletal   Abdominal   Peds  Hematology negative hematology ROS (+)   Anesthesia Other Findings   Reproductive/Obstetrics                          Anesthesia Physical Anesthesia Plan  ASA: III  Anesthesia Plan: MAC, Regional and Spinal   Post-op Pain Management:    Induction:   Airway Management Planned: Natural Airway and Simple Face Mask  Additional Equipment: None  Intra-op Plan:   Post-operative Plan:   Informed Consent: I have reviewed the patients History and Physical, chart, labs and discussed the procedure including the risks, benefits and alternatives for the proposed anesthesia with the patient or authorized representative who has indicated his/her understanding and acceptance.   Dental advisory given  Plan Discussed with: CRNA and Surgeon  Anesthesia Plan Comments:         Anesthesia Quick Evaluation

## 2014-08-12 NOTE — Op Note (Signed)
PREOP DIAGNOSIS: DJD LEFT KNEE POSTOP DIAGNOSIS:  same PROCEDURE: LEFT TKR ANESTHESIA: Spinal and block ATTENDING SURGEON: Coron Rossano Turner ASSISTANT: Loni Dolly PA  INDICATIONS FOR PROCEDURE: Chad Turner is a 53 y.o. male who has struggled for a long time with pain due to degenerative arthritis of the left knee.  The patient has failed many conservative non-operative measures and at this point has pain which limits the ability to sleep and walk.  The patient is offered total knee replacement.  Informed operative consent was obtained after discussion of possible risks of anesthesia, infection, neurovascular injury, DVT, and death.  The importance of the post-operative rehabilitation protocol to optimize result was stressed extensively with the patient.  SUMMARY OF FINDINGS AND PROCEDURE:  Chad Turner was taken to the operative suite where under the above anesthesia a left knee replacement was performed.  There were advanced degenerative changes and the bone quality was excellent.  We used the DePuyLCS system and placed size standard plus femur, 4 MBT tibia, 38 mm all polyethylene patella, and a size 10 mm spacer.  Loni Dolly PA-C assisted throughout and was invaluable to the completion of the case in that he helped retract and maintain exposure while I placed the components.  He also helped close thereby minimizing OR time.  The patient was admitted for appropriate post-op care to include perioperative antibiotics and mechanical and pharmacologic measures for DVT prophylaxis.  DESCRIPTION OF PROCEDURE:  Chad Turner was taken to the operative suite where the above anesthesia was applied.  The patient was positioned supine and prepped and draped in normal sterile fashion.  An appropriate time out was performed.  After the administration of vancomycin pre-op antibiotic the leg was elevated and exsanguinated and a tourniquet inflated.  A standard longitudinal incision was made on the anterior  knee.  Dissection was carried down to the extensor mechanism.  All appropriate anti-infective measures were used including the pre-operative antibiotic, betadine impregnated drape, and closed hooded exhaust systems for each member of the surgical team.  A medial parapatellar incision was made in the extensor mechanism and the knee cap flipped and the knee flexed.  Some residual meniscal tissues were removed along with any remaining ACL/PCL tissue.  A guide was placed on the tibia and a flat cut was made on it's superior surface.  An intramedullary guide was placed in the femur and was utilized to make anterior and posterior cuts creating an appropriate flexion gap.  A second intramedullary guide was placed in the femur to make a distal cut properly balancing the knee with an extension gap equal to the flexion gap.  The three bones sized to the above mentioned sizes and the appropriate guides were placed and utilized.  A trial reduction was done and the knee easily came to full extension and the patella tracked well on flexion.  The trial components were removed and all bones were cleaned with pulsatile lavage and then dried thoroughly.  Cement was mixed and was pressurized onto the bones followed by placement of the aforementioned components.  Excess cement was trimmed and pressure was held on the components until the cement had hardened.  The tourniquet was deflated and a small amount of bleeding was controlled with cautery and pressure.  The knee was irrigated thoroughly.  The extensor mechanism was re-approximated with V-loc suture in running fashion.  The knee was flexed and the repair was solid.  The subcutaneous tissues were re-approximated with #0 and #2-0 vicryl and the  skin closed with a subcuticular stitch and steristrips.  A sterile dressing was applied.  Intraoperative fluids, EBL, and tourniquet time can be obtained from anesthesia records.  DISPOSITION:  The patient was taken to recovery room in  stable condition and admitted for appropriate post-op care to include peri-operative antibiotic and DVT prophylaxis with mechanical and pharmacologic measures.  Chad Turner 08/12/2014, 1:04 PM

## 2014-08-12 NOTE — Interval H&P Note (Signed)
History and Physical Interval Note:  08/12/2014 10:33 AM  Chad Turner  has presented today for surgery, with the diagnosis of LEFT KNEE DEGENERATIVE JOINT DISEASE  The various methods of treatment have been discussed with the patient and family. After consideration of risks, benefits and other options for treatment, the patient has consented to  Procedure(s): TOTAL KNEE ARTHROPLASTY (Left) as a surgical intervention .  The patient's history has been reviewed, patient examined, no change in status, stable for surgery.  I have reviewed the patient's chart and labs.  Questions were answered to the patient's satisfaction.     Tammee Thielke G

## 2014-08-12 NOTE — Progress Notes (Signed)
Orthopedic Tech Progress Note Patient Details:  Chad Turner 02-12-1961 [id number]  CPM Left Knee CPM Left Knee: On Left Knee Flexion (Degrees): 90 Left Knee Extension (Degrees): 0 Additional Comments: Trapeze bar and foot roll   Irish Elders 08/12/2014, 2:34 PM

## 2014-08-12 NOTE — Progress Notes (Signed)
Utilization review completed.  

## 2014-08-12 NOTE — Anesthesia Procedure Notes (Addendum)
Spinal  Patient location during procedure: OR Start time: 08/12/2014 11:30 AM End time: 08/12/2014 11:34 AM Staffing Anesthesiologist: Lindwood Mogel, CHRIS Preanesthetic Checklist Completed: patient identified, surgical consent, pre-op evaluation, timeout performed, IV checked, risks and benefits discussed and monitors and equipment checked Spinal Block Patient position: sitting Prep: site prepped and draped and DuraPrep Patient monitoring: heart rate, cardiac monitor, continuous pulse ox and blood pressure Approach: midline Location: L3-4 Injection technique: single-shot Needle Needle type: Pencan  Needle gauge: 24 G Needle length: 10 cm Assessment Sensory level: T6  Anesthesia Regional Block:  Adductor canal block  Pre-Anesthetic Checklist: ,, timeout performed, Correct Patient, Correct Site, Correct Laterality, Correct Procedure, Correct Position, site marked, Risks and benefits discussed,  Surgical consent,  Pre-op evaluation,  At surgeon's request and post-op pain management  Laterality: Lower and Left  Prep: chloraprep       Needles:  Injection technique: Single-shot  Needle Type: Echogenic Needle          Additional Needles:  Procedures: ultrasound guided (picture in chart) Adductor canal block Narrative:  Start time: 08/12/2014 1:28 PM End time: 08/12/2014 1:33 PM Injection made incrementally with aspirations every 5 mL.  Performed by: Personally  Anesthesiologist: Kyen Taite  Additional Notes: H+P and labs reviewed, risks and benefits discussed with patient, procedure tolerated well without complications

## 2014-08-12 NOTE — Transfer of Care (Signed)
Immediate Anesthesia Transfer of Care Note  Patient: Chad Turner  Procedure(s) Performed: Procedure(s): TOTAL KNEE ARTHROPLASTY (Left)  Patient Location: PACU  Anesthesia Type:Spinal  Level of Consciousness: awake and alert   Airway & Oxygen Therapy: Patient Spontanous Breathing and Patient connected to nasal cannula oxygen  Post-op Assessment: Report given to PACU RN and Post -op Vital signs reviewed and stable  Post vital signs: Reviewed and stable  Complications: No apparent anesthesia complications

## 2014-08-13 ENCOUNTER — Encounter (HOSPITAL_COMMUNITY): Payer: Self-pay | Admitting: Orthopaedic Surgery

## 2014-08-13 LAB — CBC
HCT: 35.8 % — ABNORMAL LOW (ref 39.0–52.0)
Hemoglobin: 12.2 g/dL — ABNORMAL LOW (ref 13.0–17.0)
MCH: 29.2 pg (ref 26.0–34.0)
MCHC: 34.1 g/dL (ref 30.0–36.0)
MCV: 85.6 fL (ref 78.0–100.0)
Platelets: 229 10*3/uL (ref 150–400)
RBC: 4.18 MIL/uL — ABNORMAL LOW (ref 4.22–5.81)
RDW: 13.1 % (ref 11.5–15.5)
WBC: 7.9 10*3/uL (ref 4.0–10.5)

## 2014-08-13 LAB — BASIC METABOLIC PANEL
Anion gap: 12 (ref 5–15)
BUN: 3 mg/dL — ABNORMAL LOW (ref 6–23)
CO2: 25 mEq/L (ref 19–32)
Calcium: 8.8 mg/dL (ref 8.4–10.5)
Chloride: 92 mEq/L — ABNORMAL LOW (ref 96–112)
Creatinine, Ser: 0.77 mg/dL (ref 0.50–1.35)
GFR calc Af Amer: >90 mL/min (ref >90)
GFR calc non Af Amer: >90 mL/min (ref >90)
Glucose, Bld: 122 mg/dL — ABNORMAL HIGH (ref 70–99)
Potassium: 3.5 mEq/L — ABNORMAL LOW (ref 3.7–5.3)
Sodium: 129 mEq/L — ABNORMAL LOW (ref 137–147)

## 2014-08-13 MED ORDER — INFLUENZA VAC SPLIT QUAD 0.5 ML IM SUSY
0.5000 mL | PREFILLED_SYRINGE | INTRAMUSCULAR | Status: AC
  Administered 2014-08-14: 0.5 mL via INTRAMUSCULAR
  Filled 2014-08-13: qty 0.5

## 2014-08-13 MED ORDER — HYDROCHLOROTHIAZIDE 12.5 MG PO CAPS
12.5000 mg | ORAL_CAPSULE | Freq: Every day | ORAL | Status: DC
  Administered 2014-08-13 – 2014-08-14 (×2): 12.5 mg via ORAL
  Filled 2014-08-13 (×2): qty 1

## 2014-08-13 NOTE — Evaluation (Signed)
Physical Therapy Evaluation Patient Details Name: Chad Turner MRN: [id number] DOB: [birth year] Today's Date: 08/13/2014   History of Present Illness  53 y.o. male s/p left total knee arthroplasty.  Clinical Impression  Pt is s/p left TKA resulting in the deficits listed below (see PT Problem List). Able to ambulate up to 75 feet this AM with min guard assist. Reviewed knee precautions, safe mobility, and therapeutic exercises. Pt will benefit from skilled PT to increase their independence and safety with mobility to allow discharge to the venue listed below.      Follow Up Recommendations Home health PT;Supervision for mobility/OOB    Equipment Recommendations  3in1 (PT)    Recommendations for Other Services OT consult     Precautions / Restrictions Precautions Precautions: Knee Precaution Booklet Issued: Yes (comment) Precaution Comments: Reviewed knee precautions Required Braces or Orthoses: Knee Immobilizer - Left Restrictions Weight Bearing Restrictions: Yes LLE Weight Bearing: Weight bearing as tolerated      Mobility  Bed Mobility Overal bed mobility: Modified Independent                Transfers Overall transfer level: Needs assistance Equipment used: Rolling walker (2 wheeled) Transfers: Sit to/from Stand Sit to Stand: Min guard         General transfer comment: min guard for safety from lowest bed setting. VC for hand placement. good control with descent into chair  Ambulation/Gait Ambulation/Gait assistance: Min guard Ambulation Distance (Feet): 75 Feet Assistive device: Rolling walker (2 wheeled) Gait Pattern/deviations: Step-to pattern;Step-through pattern;Decreased step length - right;Decreased stance time - left;Antalgic;Trunk flexed   Gait velocity interpretation: Below normal speed for age/gender General Gait Details: Educated on safe DME use with a rolling walker. VC for sequencing and upright posture. Practiced step-through gait  pattern towards end of distance. No buckling noted with knee immobilizer in place.  Stairs            Wheelchair Mobility    Modified Rankin (Stroke Patients Only)       Balance Overall balance assessment: Needs assistance Sitting-balance support: No upper extremity supported;Feet supported Sitting balance-Leahy Scale: Good     Standing balance support: Bilateral upper extremity supported Standing balance-Leahy Scale: Poor                               Pertinent Vitals/Pain Pain Assessment: 0-10 Pain Score: 8  Pain Location: Lt knee Pain Descriptors / Indicators: Constant Pain Intervention(s): Limited activity within patient's tolerance;Monitored during session;Repositioned;Patient requesting pain meds-RN notified    Home Living Family/patient expects to be discharged to:: Private residence Living Arrangements: Spouse/significant other (Girlfriend) Available Help at Discharge: Available 24 hours/day Type of Home: Apartment Home Access: Stairs to enter Entrance Stairs-Rails:  (unsure) Entrance Stairs-Number of Steps: 1 Home Layout: One level Home Equipment: Walker - 2 wheels;Bedside commode      Prior Function Level of Independence: Independent               Hand Dominance   Dominant Hand: Right    Extremity/Trunk Assessment   Upper Extremity Assessment: Defer to OT evaluation           Lower Extremity Assessment: LLE deficits/detail   LLE Deficits / Details: decreased strength and ROM as expected post op     Communication   Communication: No difficulties  Cognition Arousal/Alertness: Awake/alert Behavior During Therapy: WFL for tasks assessed/performed Overall Cognitive Status: Within Functional Limits for tasks assessed  General Comments      Exercises Total Joint Exercises Ankle Circles/Pumps: AROM;Both;10 reps;Supine Quad Sets: AROM;Left;10 reps;Supine      Assessment/Plan    PT  Assessment Patient needs continued PT services  PT Diagnosis Difficulty walking;Abnormality of gait;Acute pain   PT Problem List Decreased strength;Decreased range of motion;Decreased activity tolerance;Decreased balance;Decreased mobility;Decreased knowledge of use of DME;Decreased knowledge of precautions;Pain  PT Treatment Interventions DME instruction;Gait training;Stair training;Functional mobility training;Therapeutic activities;Therapeutic exercise;Balance training;Neuromuscular re-education;Patient/family education;Modalities   PT Goals (Current goals can be found in the Care Plan section) Acute Rehab PT Goals Patient Stated Goal: Go home PT Goal Formulation: With patient Time For Goal Achievement: 08/20/14 Potential to Achieve Goals: Good    Frequency 7X/week   Barriers to discharge        Co-evaluation               End of Session Equipment Utilized During Treatment: Gait belt;Left knee immobilizer Activity Tolerance: Patient tolerated treatment well Patient left: in chair;with call bell/phone within reach Nurse Communication: Mobility status;Patient requests pain meds         Time: 7741-4239 PT Time Calculation (min): 28 min   Charges:   PT Evaluation $Initial PT Evaluation Tier I: 1 Procedure PT Treatments $Therapeutic Activity: 8-22 mins   PT G Codes:         Elayne Snare, [place]  [name] 08/13/2014, 9:52 AM

## 2014-08-13 NOTE — Evaluation (Signed)
Occupational Therapy Evaluation Patient Details Name: Chad Turner MRN: [id number] DOB: 1961/06/07 Today's Date: 08/13/2014    History of Present Illness 53 y.o. male s/p left total knee arthroplasty.   Clinical Impression   Pt admitted with the above diagnoses and presents with below problem list. Pt will benefit from continued acute OT to address the below listed deficits and maximize independence with basic ADLs prior to d/c home. PTA pt was independent with ADLs. Pt at min guard level for LB ADLs, min A for EOB>supine bed mobility task. ADL education provided to pt.      Follow Up Recommendations  Supervision/Assistance - 24 hour;No OT follow up    Equipment Recommendations  None recommended by OT;Other (comment) (pt has 3n1)    Recommendations for Other Services       Precautions / Restrictions Precautions Precautions: Knee Precaution Booklet Issued: Yes (comment) Precaution Comments: Reviewed knee precautions Required Braces or Orthoses: Knee Immobilizer - Left Restrictions Weight Bearing Restrictions: Yes LLE Weight Bearing: Weight bearing as tolerated      Mobility Bed Mobility Overal bed mobility: Needs Assistance Bed Mobility: Sit to Supine       Sit to supine: Min assist   General bed mobility comments: min A to bring LLE up to and across bed surface  Transfers Overall transfer level: Needs assistance Equipment used: Rolling walker (2 wheeled) Transfers: Sit to/from Stand Sit to Stand: Min guard         General transfer comment: vc for positioning rw directly in front of self     Balance Overall balance assessment: Needs assistance Sitting-balance support: No upper extremity supported;Feet supported Sitting balance-Leahy Scale: Good     Standing balance support: Bilateral upper extremity supported;During functional activity Standing balance-Leahy Scale: Poor Standing balance comment: relies on rw for balance                             ADL Overall ADL's : Needs assistance/impaired Eating/Feeding: Set up;Sitting   Grooming: Set up;Sitting;Standing   Upper Body Bathing: Set up;Sitting   Lower Body Bathing: Min guard;Sit to/from stand;With adaptive equipment   Upper Body Dressing : Set up;Sitting   Lower Body Dressing: Min guard;With adaptive equipment;Sit to/from stand   Toilet Transfer: Min guard;Ambulation;RW (3n1 over toilet)   Toileting- Clothing Manipulation and Hygiene: Min guard;Sit to/from stand   Tub/ Shower Transfer: Min guard;Ambulation;3 in 1;Rolling walker   Functional mobility during ADLs: Min guard;Rolling walker General ADL Comments: Education on techniques and AE for safe completion of ADLs provided to pt.      Vision                     Perception     Praxis      Pertinent Vitals/Pain Pain Assessment: 0-10 Pain Score: 7  Pain Location: left knee Pain Descriptors / Indicators: Aching Pain Intervention(s): Limited activity within patient's tolerance;Monitored during session;Patient requesting pain meds-RN notified;Repositioned     Hand Dominance Right   Extremity/Trunk Assessment Upper Extremity Assessment Upper Extremity Assessment: Overall WFL for tasks assessed   Lower Extremity Assessment Lower Extremity Assessment: Defer to PT evaluation        Communication Communication Communication: No difficulties   Cognition Arousal/Alertness: Awake/alert Behavior During Therapy: WFL for tasks assessed/performed Overall Cognitive Status: Within Functional Limits for tasks assessed  General Comments       Exercises      Shoulder Instructions      Home Living Family/patient expects to be discharged to:: Private residence Living Arrangements: Spouse/significant other (girlfriend is a Quarry manager) Available Help at Discharge: Available 24 hours/day Type of Home: Apartment Home Access: Stairs to enter [employer] of Steps:  1 Entrance Stairs-Rails:  (unsure) Home Layout: One level     Bathroom Shower/Tub: Teacher, early years/pre: Standard Bathroom Accessibility: Yes How Accessible: Accessible via walker Home Equipment: [place] - 2 wheels;Bedside commode          Prior Functioning/Environment Level of Independence: Independent             OT Diagnosis: Acute pain   OT Problem List: Impaired balance (sitting and/or standing);Decreased knowledge of use of DME or AE;Decreased knowledge of precautions;Pain   OT Treatment/Interventions: Self-care/ADL training;DME and/or AE instruction;Therapeutic activities;Patient/family education;Balance training    OT Goals(Current goals can be found in the care plan section) Acute Rehab OT Goals Patient Stated Goal: get back to regular life OT Goal Formulation: With patient Time For Goal Achievement: 08/20/14 Potential to Achieve Goals: Good ADL Goals Pt Will Perform Lower Body Bathing: with supervision;with adaptive equipment;sit to/from stand Pt Will Perform Lower Body Dressing: with supervision;with adaptive equipment;sit to/from stand Pt Will Perform Tub/Shower Transfer: with supervision;ambulating;3 in 1;rolling walker Additional ADL Goal #1: Pt will perform EOB>supine at mod I level to prepare for OOB ADLs.  OT Frequency: Min 2X/week   Barriers to D/C:            Co-evaluation              End of Session Equipment Utilized During Treatment: Gait belt;Rolling walker;Left knee immobilizer CPM Left Knee CPM Left Knee: Off Additional Comments: foot roll left off at pt request 0945  Activity Tolerance: Patient tolerated treatment well;Patient limited by pain Patient left: in bed;with call bell/phone within reach   Time: 0930-0949 OT Time Calculation (min): 19 min Charges:  OT General Charges $OT Visit: 1 Procedure OT Evaluation $Initial OT Evaluation Tier I: 1 Procedure OT Treatments $Self Care/Home Management : 8-22  mins G-Codes:    Hortencia Pilar 2014/09/12, 10:03 AM

## 2014-08-13 NOTE — Progress Notes (Signed)
Subjective: 1 Day Post-Op Procedure(s) (LRB): TOTAL KNEE ARTHROPLASTY (Left) Pt sitting up in bed, alert and eating; complained of nausea last night  Activity level:  WBAT Diet tolerance:  Eating well  Voiding:  ok Patient reports pain as mild and moderate.    Objective: Vital signs in last 24 hours: Temp:  [97.5 F (36.4 C)-98.8 F (37.1 C)] 98.8 F (37.1 C) (10/14 0601) Pulse Rate:  [84-109] 89 (10/14 0601) Resp:  [16-22] 18 (10/14 0601) BP: (113-150)/(77-95) 113/77 mmHg (10/14 0601) SpO2:  [95 %-99 %] 95 % (10/14 0601) Weight:  [94.348 kg (208 lb)] 94.348 kg (208 lb) (10/13 1014)  Labs:  Recent Labs  08/13/14 0532  HGB 12.2*    Recent Labs  08/13/14 0532  WBC 7.9  RBC 4.18*  HCT 35.8*  PLT 229    Recent Labs  08/13/14 0532  NA 129*  K 3.5*  CL 92*  CO2 25  BUN 3*  CREATININE 0.77  GLUCOSE 122*  CALCIUM 8.8   No results found for this basename: LABPT, INR,  in the last 72 hours  Physical Exam:  Neurologically intact ABD soft Neurovascular intact Sensation intact distally Intact pulses distally Dorsiflexion/Plantar flexion intact Incision: scant drainage No cellulitis present Compartment soft  Assessment/Plan:  1 Day Post-Op Procedure(s) (LRB): TOTAL KNEE ARTHROPLASTY (Left) Advance diet Up with therapy D/C IV fluids Plan for discharge tomorrow Discharge home with home health  ASA 325mg  PO BID for 2 weeks Post-op Dressing changed today to Aquacel Follow up in the office in 2 weeks  Pain well controlled but patient had nausea with medication, if persists will consider changing pain medication or sending home with nausea medication    Chad Turner, Chad Turner 08/13/2014, 7:58 AM

## 2014-08-13 NOTE — Progress Notes (Signed)
Physical Therapy Treatment Patient Details Name: Chad Turner MRN: [id number] DOB: 03/26/1961 Today's Date: 08/13/2014    History of Present Illness 53 y.o. male s/p left total knee arthroplasty.    PT Comments    Patient is progressing well towards physical therapy goals, ambulating up to 160 feet with supervision while using a rolling walker. Safely completed stair training and is tolerating therapeutic exercises well. Patient will continue to benefit from skilled physical therapy services to further improve independence with functional mobility.    Follow Up Recommendations  Home health PT;Supervision for mobility/OOB     Equipment Recommendations  3in1 (PT)    Recommendations for Other Services OT consult     Precautions / Restrictions Precautions Precautions: Knee Precaution Comments: Reviewed knee precautions Required Braces or Orthoses: Knee Immobilizer - Left Restrictions Weight Bearing Restrictions: Yes LLE Weight Bearing: Weight bearing as tolerated    Mobility  Bed Mobility Overal bed mobility: Modified Independent                Transfers Overall transfer level: Needs assistance Equipment used: Rolling walker (2 wheeled) Transfers: Sit to/from Stand Sit to Stand: Supervision         General transfer comment: Supervision for safety, correctly places hands on stable surface to rise. Poor control with descent into chair.  Ambulation/Gait Ambulation/Gait assistance: Supervision Ambulation Distance (Feet): 160 Feet Assistive device: Rolling walker (2 wheeled) Gait Pattern/deviations: Step-to pattern;Step-through pattern;Decreased step length - right;Decreased stance time - left;Antalgic;Trunk flexed   Gait velocity interpretation: Below normal speed for age/gender General Gait Details: Focused on step through gait pattern, no instances of buckling with knee immobilizer in place. Continues to use UE heavily on RW.   Stairs Stairs:  Yes Stairs assistance: Min assist Stair Management: No rails;Step to pattern;Backwards;With walker Number of Stairs: 1 (x2) General stair comments: Practiced navigating stairs similar to home environment. VC for sequencing. Able to safely teach back and perform on second trial.  Wheelchair Mobility    Modified Rankin (Stroke Patients Only)       Balance                                    Cognition Arousal/Alertness: Awake/alert Behavior During Therapy: WFL for tasks assessed/performed Overall Cognitive Status: Within Functional Limits for tasks assessed                      Exercises Total Joint Exercises Ankle Circles/Pumps: AROM;Both;10 reps;Supine Quad Sets: AROM;Left;10 reps;Supine Heel Slides: AROM;Left;10 reps;Seated Long Arc Quad: Left;10 reps;Seated;AAROM Goniometric ROM: 6-76 degrees left knee flexion in sitting    General Comments        Pertinent Vitals/Pain Pain Assessment: 0-10 Pain Score: 8  Pain Location: Lt knee Pain Intervention(s): Limited activity within patient's tolerance;Monitored during session;Repositioned;Patient requesting pain meds-RN notified    Home Living Family/patient expects to be discharged to:: Private residence Living Arrangements: Spouse/significant other                  Prior Function            PT Goals (current goals can now be found in the care plan section) Acute Rehab PT Goals PT Goal Formulation: With patient Time For Goal Achievement: 08/20/14 Potential to Achieve Goals: Good Progress towards PT goals: Progressing toward goals    Frequency  7X/week    PT Plan Current plan remains appropriate  Co-evaluation             End of Session Equipment Utilized During Treatment: Gait belt;Left knee immobilizer Activity Tolerance: Patient tolerated treatment well Patient left: in chair;with call bell/phone within reach     Time: 1443-1540 PT Time Calculation (min): 24  min  Charges:  $Gait Training: 8-22 mins $Therapeutic Exercise: 8-22 mins                    G Codes:      [employer], [place]  [name] 08/13/2014, 3:10 PM

## 2014-08-14 DIAGNOSIS — M179 Osteoarthritis of knee, unspecified: Secondary | ICD-10-CM | POA: Diagnosis not present

## 2014-08-14 LAB — CBC
HCT: 33.2 % — ABNORMAL LOW (ref 39.0–52.0)
Hemoglobin: 11.5 g/dL — ABNORMAL LOW (ref 13.0–17.0)
MCH: 29.4 pg (ref 26.0–34.0)
MCHC: 34.6 g/dL (ref 30.0–36.0)
MCV: 84.9 fL (ref 78.0–100.0)
Platelets: 196 10*3/uL (ref 150–400)
RBC: 3.91 MIL/uL — ABNORMAL LOW (ref 4.22–5.81)
RDW: 12.8 % (ref 11.5–15.5)
WBC: 8 10*3/uL (ref 4.0–10.5)

## 2014-08-14 MED ORDER — ASPIRIN 325 MG PO TBEC: 325.0000 mg | DELAYED_RELEASE_TABLET | Freq: Two times a day (BID) | ORAL | Status: DC

## 2014-08-14 MED ORDER — OXYCODONE-ACETAMINOPHEN 5-325 MG PO TABS: 1.0000 | ORAL_TABLET | Freq: Four times a day (QID) | ORAL | Status: DC | PRN

## 2014-08-14 MED ORDER — METHOCARBAMOL 500 MG PO TABS: 500.0000 mg | ORAL_TABLET | Freq: Four times a day (QID) | ORAL | Status: DC | PRN

## 2014-08-14 NOTE — Care Management Note (Signed)
CARE MANAGEMENT NOTE 08/14/2014  Patient:  Chad Turner, Chad Turner   Account Number:  [id number]  Date Initiated:  08/14/2014  Documentation initiated by:  Ricki Miller  Subjective/Objective Assessment:   53 yr old male admitted with left knee DJD, underwent left total knee arthroplasty.     Action/Plan:   Patient will receive home health services through [facility]. HAs family support at discharge.   Anticipated DC Date:  08/14/2014   Anticipated DC Plan:  [place]  CM consult      PAC Choice  [place]   Choice offered to / List presented to:  C-1 Patient   DME arranged  [place]  3-N-1  CPM      DME agency  [employer]     [place] arranged  HH-2 PT      [place]   Status of service:  Completed, signed off Medicare Important Message given?  NA - LOS <3 / Initial given by admissions (If response is "NO", the following Medicare IM given date fields will be blank) Date Medicare IM given:   Medicare IM given by:   Date Additional Medicare IM given:   Additional Medicare IM given by:    Discharge Disposition:  [place]  Per UR Regulation:  Reviewed for med. necessity/level of care/duration of stay  If discussed at [place] of Stay Meetings, dates discussed:

## 2014-08-14 NOTE — Progress Notes (Signed)
Occupational Therapy Treatment Patient Details Name: Chad Turner MRN: [id number] DOB: 1961/09/12 Today's Date: 08/14/2014    History of present illness 53 y.o. male s/p left total knee arthroplasty.   OT comments  Pt seen today for LB dressing and tub transfer. Pt progressing well with mobility. Education and training completed for compensatory techniques for LB dressing and tub transfer with pt return demo. No further acute OT needs.  Follow Up Recommendations  Supervision/Assistance - 24 hour;No OT follow up    Equipment Recommendations  None recommended by OT    Recommendations for Other Services      Precautions / Restrictions Precautions Precautions: Knee Precaution Comments: Reviewed knee precautions Required Braces or Orthoses: Knee Immobilizer - Left Restrictions Weight Bearing Restrictions: No LLE Weight Bearing: Weight bearing as tolerated       Mobility Bed Mobility Overal bed mobility: Modified Independent             General bed mobility comments: Pt able to advance LLE to EOB by holding KI.  Transfers Overall transfer level: Needs assistance Equipment used: Rolling walker (2 wheeled) Transfers: Sit to/from Stand Sit to Stand: Supervision         General transfer comment: Supervision for safety. Pt with continued difficulty with controlled descent despite VC's.     Balance Overall balance assessment: Needs assistance Sitting-balance support: No upper extremity supported;Feet supported Sitting balance-Leahy Scale: Good     Standing balance support: No upper extremity supported;During functional activity Standing balance-Leahy Scale: Fair Standing balance comment: Pt able to stand without UE support to don shirt with no LOB. Pt requires UE support for dynamic standing.                   ADL Overall ADL's : Needs assistance/impaired     Grooming: Supervision/safety;Standing           Upper Body Dressing : Set up;Standing    Lower Body Dressing: Sit to/from stand;Supervision/safety;Set up   Toilet Transfer: Ambulation;RW;Supervision/safety Toilet Transfer Details (indicate cue type and reason): supervision for safety     Tub/ Shower Transfer: Min guard;Ambulation;3 in 1;Rolling walker   Functional mobility during ADLs: Supervision/safety;Rolling walker General ADL Comments: Pt donned clothes to prepare for d/c and practiced tub transfer using compensatory technique. Reviewed donning/doffing KI with pt return demo x2.                  Cognition  Arousal/Alertness: Awake/Alert Behavior During Therapy: WFL for tasks assessed/performed Overall Cognitive Status: Within Functional Limits for tasks assessed                                    Pertinent Vitals/ Pain       Pain Assessment: 0-10 Pain Score: 5  Pain Location: Lt knee Pain Descriptors / Indicators: Aching Pain Intervention(s): Limited activity within patient's tolerance;Monitored during session;Repositioned            Progress Toward Goals  OT Goals(current goals can now be found in the care plan section)  Progress towards OT goals: Goals met/education completed, patient discharged from OT     Plan All goals met and education completed, patient discharged from OT services       End of Session Equipment Utilized During Treatment: Gait belt;Rolling walker;Left knee immobilizer CPM Left Knee CPM Left Knee: On Left Knee Flexion (Degrees): 60 Left Knee Extension (Degrees): 0   Activity Tolerance Patient  tolerated treatment well   Patient Left in chair;with call bell/phone within reach   Nurse Communication          Time: [id number] OT Time Calculation (min): 32 min  Charges: OT General Charges $OT Visit: 1 Procedure OT Treatments $Self Care/Home Management : 23-37 mins  Juluis Rainier 08/14/2014, 9:25 AM  Cyndie Chime, OTR/L Occupational Therapist [phone] (pager)
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
## 2014-08-14 NOTE — Progress Notes (Signed)
Patient ready for discharge. Mom will be patient's ride home.

## 2014-08-14 NOTE — Progress Notes (Signed)
Physical Therapy Treatment Patient Details Name: Chad Turner MRN: [id number] DOB: [birth year] Today's Date: 08/14/2014    History of Present Illness 53 y.o. male s/p left total knee arthroplasty.    PT Comments    Pt progressing towards PT goals however, ambulatory distance decreased compared to yesterday due to pain today, in spite of coordinating pain medication with therapy session. Reviewed therapeutic exercises and precautions. Safely completed stair training yesterday and pt will have 24 hour care at home from family. Feel he is adequate for d/c from a mobility standpoint when medically ready.  Follow Up Recommendations  Home health PT;Supervision for mobility/OOB     Equipment Recommendations  3in1 (PT)    Recommendations for Other Services OT consult     Precautions / Restrictions Precautions Precautions: Knee Precaution Comments: Reviewed knee precautions Required Braces or Orthoses: Knee Immobilizer - Left Restrictions Weight Bearing Restrictions: Yes LLE Weight Bearing: Weight bearing as tolerated    Mobility  Bed Mobility Overal bed mobility: Modified Independent             General bed mobility comments: Pt able to advance LLE to EOB by holding KI.  Transfers Overall transfer level: Needs assistance Equipment used: Rolling walker (2 wheeled) Transfers: Sit to/from Stand Sit to Stand: Supervision         General transfer comment: Supervision for safety, correctly places hands on stable surface to rise. Performed from recliner and demonstrates improved control with descent today.  Ambulation/Gait Ambulation/Gait assistance: Supervision Ambulation Distance (Feet): 80 Feet Assistive device: Rolling walker (2 wheeled) Gait Pattern/deviations: Step-to pattern;Decreased step length - right;Decreased stance time - left;Antalgic   Gait velocity interpretation: Below normal speed for age/gender General Gait Details: VC for left knee extension in  stance phase for quad activation. Mild instability without knee immobilizer in place and increased use of UEs to decrease WB through LLE. Distance limited due to pain despite coordinating pain medication with therapy session.   Stairs            Wheelchair Mobility    Modified Rankin (Stroke Patients Only)       Balance Overall balance assessment: Needs assistance Sitting-balance support: No upper extremity supported;Feet supported Sitting balance-Leahy Scale: Good     Standing balance support: No upper extremity supported;During functional activity Standing balance-Leahy Scale: Fair Standing balance comment: Pt able to stand without UE support to don shirt with no LOB. Pt requires UE support for dynamic standing.                    Cognition Arousal/Alertness: Awake/alert Behavior During Therapy: WFL for tasks assessed/performed Overall Cognitive Status: Within Functional Limits for tasks assessed                      Exercises Total Joint Exercises Quad Sets: AROM;Left;10 reps;Supine Heel Slides: AROM;Left;10 reps;Seated Goniometric ROM: 9-68 degrees left knee flexion in sitting (Pt with more pain/stiffness today)    General Comments        Pertinent Vitals/Pain Pain Assessment: 0-10 Pain Score: 6  Pain Location: Lt knee Pain Descriptors / Indicators: Aching Pain Intervention(s): Limited activity within patient's tolerance;Monitored during session;Repositioned;Premedicated before session    Home Living                      Prior Function            PT Goals (current goals can now be found in the care plan section) Acute  Rehab PT Goals Patient Stated Goal: Go home today PT Goal Formulation: With patient Time For Goal Achievement: 08/20/14 Potential to Achieve Goals: Good Progress towards PT goals: Progressing toward goals    Frequency  7X/week    PT Plan Current plan remains appropriate    Co-evaluation              End of Session Equipment Utilized During Treatment: Gait belt Activity Tolerance: Patient limited by pain Patient left: in chair;with call bell/phone within reach     Time: 9758-8325 PT Time Calculation (min): 13 min  Charges:  $Gait Training: 8-22 mins                    G Codes:      [employer], [place]  [name] 08/14/2014, 10:55 AM

## 2014-10-03 ENCOUNTER — Ambulatory Visit: Payer: Medicare Other | Attending: Orthopaedic Surgery | Admitting: Physical Therapy

## 2014-10-03 DIAGNOSIS — M25662 Stiffness of left knee, not elsewhere classified: Secondary | ICD-10-CM | POA: Insufficient documentation

## 2014-10-03 DIAGNOSIS — R609 Edema, unspecified: Secondary | ICD-10-CM | POA: Insufficient documentation

## 2014-10-03 DIAGNOSIS — R262 Difficulty in walking, not elsewhere classified: Secondary | ICD-10-CM | POA: Insufficient documentation

## 2014-10-10 ENCOUNTER — Ambulatory Visit: Payer: Medicare Other

## 2014-10-13 ENCOUNTER — Ambulatory Visit: Payer: Medicare Other

## 2014-10-13 DIAGNOSIS — R262 Difficulty in walking, not elsewhere classified: Secondary | ICD-10-CM | POA: Diagnosis not present

## 2014-10-13 DIAGNOSIS — M25662 Stiffness of left knee, not elsewhere classified: Secondary | ICD-10-CM | POA: Diagnosis not present

## 2014-10-13 DIAGNOSIS — R609 Edema, unspecified: Secondary | ICD-10-CM
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
## 2014-10-13 NOTE — Patient Instructions (Signed)
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

## 2014-10-20 ENCOUNTER — Ambulatory Visit: Payer: Medicare Other

## 2014-10-20 DIAGNOSIS — R262 Difficulty in walking, not elsewhere classified: Secondary | ICD-10-CM

## 2014-10-20 DIAGNOSIS — M25662 Stiffness of left knee, not elsewhere classified: Secondary | ICD-10-CM

## 2014-10-20 DIAGNOSIS — R609 Edema, unspecified: Secondary | ICD-10-CM
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
## 2014-10-23 ENCOUNTER — Ambulatory Visit: Payer: Medicare Other

## 2014-10-27 ENCOUNTER — Ambulatory Visit: Payer: Medicare Other

## 2014-10-29 ENCOUNTER — Ambulatory Visit: Payer: Medicare Other

## 2014-11-03 ENCOUNTER — Ambulatory Visit: Payer: Medicare Other | Attending: Orthopaedic Surgery

## 2014-11-03 DIAGNOSIS — M25662 Stiffness of left knee, not elsewhere classified: Secondary | ICD-10-CM | POA: Insufficient documentation

## 2014-11-03 DIAGNOSIS — R609 Edema, unspecified: Secondary | ICD-10-CM | POA: Insufficient documentation

## 2014-11-03 DIAGNOSIS — R262 Difficulty in walking, not elsewhere classified: Secondary | ICD-10-CM | POA: Insufficient documentation

## 2014-11-11 ENCOUNTER — Ambulatory Visit: Payer: Medicare Other

## 2014-11-17 ENCOUNTER — Ambulatory Visit: Payer: Medicare Other

## 2014-11-18 ENCOUNTER — Telehealth: Payer: Self-pay | Admitting: *Deleted

## 2014-11-18 NOTE — Telephone Encounter (Signed)
Please schedule him on 1/28 at 11:00 with me.  I've held that time. Thanks!

## 2014-11-18 NOTE — Telephone Encounter (Signed)
Patient called to schedule an appt . He would like for you to give him a call...td

## 2014-11-27 ENCOUNTER — Ambulatory Visit: Payer: Medicare Other | Admitting: Physical Therapy

## 2014-11-27 DIAGNOSIS — M25662 Stiffness of left knee, not elsewhere classified: Secondary | ICD-10-CM | POA: Diagnosis not present

## 2014-11-27 DIAGNOSIS — R609 Edema, unspecified: Secondary | ICD-10-CM

## 2014-11-27 DIAGNOSIS — R262 Difficulty in walking, not elsewhere classified: Secondary | ICD-10-CM | POA: Diagnosis not present
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
## 2015-09-18 ENCOUNTER — Encounter (HOSPITAL_COMMUNITY): Payer: Self-pay | Admitting: Emergency Medicine

## 2015-09-18 ENCOUNTER — Emergency Department (INDEPENDENT_AMBULATORY_CARE_PROVIDER_SITE_OTHER)
Admission: EM | Admit: 2015-09-18 | Discharge: 2015-09-18 | Disposition: A | Discharge status: Home / Self Care | Payer: Medicare Other | Source: Home / Self Care

## 2015-09-18 DIAGNOSIS — R21 Rash and other nonspecific skin eruption: Secondary | ICD-10-CM

## 2015-09-18 LAB — POCT URINALYSIS DIP (DEVICE)
Bilirubin Urine: NEGATIVE
Glucose, UA: NEGATIVE mg/dL
Hgb urine dipstick: NEGATIVE
Ketones, ur: NEGATIVE mg/dL
Leukocytes, UA: NEGATIVE
Nitrite: NEGATIVE
Protein, ur: NEGATIVE mg/dL
Specific Gravity, Urine: 1.015 (ref 1.005–1.030)
Urobilinogen, UA: 1 mg/dL (ref 0.0–1.0)
pH: 5.5 (ref 5.0–8.0)

## 2015-09-18 MED ORDER — PREDNISONE 10 MG PO TABS: 20.0000 mg | ORAL_TABLET | Freq: Every day | ORAL | Status: DC

## 2015-09-18 NOTE — ED Notes (Signed)
C/o rash on groin x2 weeks associated w/left flank pain; treated w/Clobetasol on 08/11/2015 w/no relief Also reports partner is being treated w/similar sx A&O x4... No acute distress.
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
## 2015-09-18 NOTE — Discharge Instructions (Signed)
Drug Rash °A drug rash is a change in the color or texture of the skin that is caused by a drug. It can develop minutes, hours, or days after the person takes the drug. °CAUSES °This condition is usually caused by a drug allergy. It can also be caused by exposure to sunlight after taking a drug that makes the skin sensitive to light. Drugs that commonly cause rashes include: °· Penicillin. °· Antibiotic medicines. °· Medicines that treat seizures. °· Medicines that treat cancer (chemotherapy). °· Aspirin and other nonsteroidal anti-inflammatory drugs (NSAIDs). °· Injectable dyes that contain iodine. °· Insulin. °SYMPTOMS °Symptoms of this condition include: °· Redness. °· Tiny bumps. °· Peeling. °· Itching. °· Itchy welts (hives). °· Swelling. °The rash may appear on a small area of skin or all over the body. °DIAGNOSIS °To diagnose the condition, your health care provider will do a physical exam. He or she may also order tests to find out which drug caused the rash. Tests to find the cause of a rash include: °· Skin tests. °· Blood tests. °· Drug challenge. For this test, you stop taking all of the drugs that you do not need to take, and then you start taking them again by adding back one of the drugs at a time. °TREATMENT °A drug rash may be treated with medicines, including: °· Antihistamines. These may be given to relieve itching. °· An NSAID. This may be given to reduce swelling and treat pain. °· A steroid drug. This may be given to reduce swelling. °The rash usually goes away when the person stops taking the drug that caused it. °HOME CARE INSTRUCTIONS °· Take medicines only as directed by your health care provider. °· Let all of your health care providers know about any drug reactions you have had in the past. °· If you have hives, take a cool shower or use a cool compress to relieve itchiness. °SEEK MEDICAL CARE IF: °· You have a fever. °· Your rash is not going away. °· Your rash gets worse. °· Your rash  comes back. °· You have wheezing or coughing. °SEEK IMMEDIATE MEDICAL CARE IF: °· You start to have breathing problems. °· You start to have shortness of breath. °· You face or throat starts to swell. °· You have severe weakness with dizziness or fainting. °· You have chest pain. °  °This information is not intended to replace advice given to you by your health care provider. Make sure you discuss any questions you have with your health care provider. °  °Document Released: 11/24/2004 Document Revised: 11/07/2014 Document Reviewed: 08/13/2014 °Elsevier Interactive Patient Education ©2016 [employer] ° °

## 2015-11-01 DIAGNOSIS — K8689 Other specified diseases of pancreas: Secondary | ICD-10-CM

## 2015-11-01 HISTORY — DX: Other specified diseases of pancreas: K86.89

## 2015-12-29 ENCOUNTER — Encounter (HOSPITAL_COMMUNITY): Payer: Self-pay | Admitting: Emergency Medicine

## 2015-12-29 ENCOUNTER — Emergency Department (HOSPITAL_COMMUNITY)
Admission: EM | Admit: 2015-12-29 | Discharge: 2015-12-29 | Disposition: A | Discharge status: Home / Self Care | Payer: Medicare Other | Attending: Emergency Medicine | Admitting: Emergency Medicine

## 2015-12-29 DIAGNOSIS — Z550 Illiteracy and low-level literacy: Secondary | ICD-10-CM | POA: Diagnosis not present

## 2015-12-29 DIAGNOSIS — Z79899 Other long term (current) drug therapy: Secondary | ICD-10-CM | POA: Diagnosis not present

## 2015-12-29 DIAGNOSIS — R21 Rash and other nonspecific skin eruption: Secondary | ICD-10-CM | POA: Diagnosis present

## 2015-12-29 DIAGNOSIS — F329 Major depressive disorder, single episode, unspecified: Secondary | ICD-10-CM | POA: Insufficient documentation

## 2015-12-29 DIAGNOSIS — I10 Essential (primary) hypertension: Secondary | ICD-10-CM | POA: Insufficient documentation

## 2015-12-29 DIAGNOSIS — Z880 Allergy status to penicillin: Secondary | ICD-10-CM | POA: Insufficient documentation

## 2015-12-29 DIAGNOSIS — Z87828 Personal history of other (healed) physical injury and trauma: Secondary | ICD-10-CM | POA: Insufficient documentation

## 2015-12-29 DIAGNOSIS — Z7982 Long term (current) use of aspirin: Secondary | ICD-10-CM | POA: Insufficient documentation

## 2015-12-29 DIAGNOSIS — B86 Scabies: Secondary | ICD-10-CM | POA: Diagnosis not present

## 2015-12-29 DIAGNOSIS — J45909 Unspecified asthma, uncomplicated: Secondary | ICD-10-CM | POA: Diagnosis not present

## 2015-12-29 DIAGNOSIS — Z7952 Long term (current) use of systemic steroids: Secondary | ICD-10-CM | POA: Insufficient documentation

## 2015-12-29 DIAGNOSIS — B369 Superficial mycosis, unspecified: Secondary | ICD-10-CM | POA: Diagnosis not present

## 2015-12-29 MED ORDER — PERMETHRIN 5 % EX CREA: 1.0000 "application " | TOPICAL_CREAM | Freq: Once | CUTANEOUS | Status: DC

## 2015-12-29 MED ORDER — CLOTRIMAZOLE 1 % EX CREA: TOPICAL_CREAM | CUTANEOUS | Status: DC

## 2015-12-29 NOTE — ED Notes (Signed)
Pt c/o itching rash x 3 months. States he has been seen at [facility] for same without improvement. Has been referred to [facility] but "they never called me".
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
## 2015-12-29 NOTE — Discharge Instructions (Signed)

## 2016-01-04 ENCOUNTER — Encounter (HOSPITAL_COMMUNITY): Payer: Self-pay | Admitting: *Deleted

## 2016-01-04 ENCOUNTER — Emergency Department (HOSPITAL_COMMUNITY)
Admission: EM | Admit: 2016-01-04 | Discharge: 2016-01-04 | Disposition: A | Discharge status: Home / Self Care | Payer: Medicare Other | Attending: Emergency Medicine | Admitting: Emergency Medicine

## 2016-01-04 DIAGNOSIS — Y9289 Other specified places as the place of occurrence of the external cause: Secondary | ICD-10-CM | POA: Diagnosis not present

## 2016-01-04 DIAGNOSIS — Z7982 Long term (current) use of aspirin: Secondary | ICD-10-CM | POA: Insufficient documentation

## 2016-01-04 DIAGNOSIS — J45909 Unspecified asthma, uncomplicated: Secondary | ICD-10-CM | POA: Insufficient documentation

## 2016-01-04 DIAGNOSIS — L300 Nummular dermatitis: Secondary | ICD-10-CM | POA: Insufficient documentation

## 2016-01-04 DIAGNOSIS — Z79899 Other long term (current) drug therapy: Secondary | ICD-10-CM | POA: Insufficient documentation

## 2016-01-04 DIAGNOSIS — Z880 Allergy status to penicillin: Secondary | ICD-10-CM | POA: Insufficient documentation

## 2016-01-04 DIAGNOSIS — Y9389 Activity, other specified: Secondary | ICD-10-CM | POA: Diagnosis not present

## 2016-01-04 DIAGNOSIS — Z7952 Long term (current) use of systemic steroids: Secondary | ICD-10-CM | POA: Diagnosis not present

## 2016-01-04 DIAGNOSIS — S30861A Insect bite (nonvenomous) of abdominal wall, initial encounter: Secondary | ICD-10-CM | POA: Diagnosis not present

## 2016-01-04 DIAGNOSIS — Y998 Other external cause status: Secondary | ICD-10-CM | POA: Insufficient documentation

## 2016-01-04 DIAGNOSIS — Z87828 Personal history of other (healed) physical injury and trauma: Secondary | ICD-10-CM | POA: Diagnosis not present

## 2016-01-04 DIAGNOSIS — I10 Essential (primary) hypertension: Secondary | ICD-10-CM | POA: Insufficient documentation

## 2016-01-04 DIAGNOSIS — F329 Major depressive disorder, single episode, unspecified: Secondary | ICD-10-CM | POA: Insufficient documentation

## 2016-01-04 DIAGNOSIS — Z8619 Personal history of other infectious and parasitic diseases: Secondary | ICD-10-CM | POA: Insufficient documentation

## 2016-01-04 DIAGNOSIS — W57XXXA Bitten or stung by nonvenomous insect and other nonvenomous arthropods, initial encounter: Secondary | ICD-10-CM | POA: Insufficient documentation

## 2016-01-04 DIAGNOSIS — R21 Rash and other nonspecific skin eruption: Secondary | ICD-10-CM | POA: Diagnosis present

## 2016-01-04 MED ORDER — TRIAMCINOLONE ACETONIDE 0.1 % EX CREA: 1.0000 "application " | TOPICAL_CREAM | Freq: Two times a day (BID) | CUTANEOUS | Status: DC

## 2016-01-04 NOTE — ED Notes (Signed)
Declined W/C at D/C and was escorted to lobby by RN. 

## 2016-01-04 NOTE — Discharge Instructions (Signed)
Please use steroid cream on rash. Please follow-up with your primary care provider for dermatology referral. Please wash all of your sheets close, inspect home, consult extermination crew for evaluation of insects in and around her bed.

## 2016-01-04 NOTE — ED Notes (Signed)
Pt here for rash and itching. Was seen on 2/28 for same and diagnosed with scabies but no relief with cream prescribed.
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
## 2016-03-30 ENCOUNTER — Ambulatory Visit (HOSPITAL_COMMUNITY)
Admission: EM | Admit: 2016-03-30 | Discharge: 2016-03-30 | Disposition: A | Discharge status: Home / Self Care | Payer: Medicare Other | Attending: Emergency Medicine | Admitting: Emergency Medicine

## 2016-03-30 ENCOUNTER — Encounter (HOSPITAL_COMMUNITY): Payer: Self-pay | Admitting: Emergency Medicine

## 2016-03-30 ENCOUNTER — Emergency Department (HOSPITAL_COMMUNITY)
Admission: EM | Admit: 2016-03-30 | Discharge: 2016-03-30 | Disposition: A | Discharge status: Home / Self Care | Payer: Medicare Other | Attending: Dermatology | Admitting: Dermatology

## 2016-03-30 DIAGNOSIS — R112 Nausea with vomiting, unspecified: Secondary | ICD-10-CM | POA: Diagnosis not present

## 2016-03-30 DIAGNOSIS — R040 Epistaxis: Secondary | ICD-10-CM | POA: Insufficient documentation

## 2016-03-30 DIAGNOSIS — Z5321 Procedure and treatment not carried out due to patient leaving prior to being seen by health care provider: Secondary | ICD-10-CM | POA: Diagnosis not present

## 2016-03-30 DIAGNOSIS — J3489 Other specified disorders of nose and nasal sinuses: Secondary | ICD-10-CM

## 2016-03-30 DIAGNOSIS — R1110 Vomiting, unspecified: Secondary | ICD-10-CM | POA: Insufficient documentation

## 2016-03-30 DIAGNOSIS — K2920 Alcoholic gastritis without bleeding: Secondary | ICD-10-CM | POA: Diagnosis not present

## 2016-03-30 LAB — CBC
HCT: 40.1 % (ref 39.0–52.0)
Hemoglobin: 13.6 g/dL (ref 13.0–17.0)
MCH: 28.6 pg (ref 26.0–34.0)
MCHC: 33.9 g/dL (ref 30.0–36.0)
MCV: 84.2 fL (ref 78.0–100.0)
Platelets: 202 10*3/uL (ref 150–400)
RBC: 4.76 MIL/uL (ref 4.22–5.81)
RDW: 13.2 % (ref 11.5–15.5)
WBC: 7.4 10*3/uL (ref 4.0–10.5)

## 2016-03-30 LAB — COMPREHENSIVE METABOLIC PANEL
ALT: 44 U/L (ref 17–63)
AST: 83 U/L — ABNORMAL HIGH (ref 15–41)
Albumin: 3.7 g/dL (ref 3.5–5.0)
Alkaline Phosphatase: 111 U/L (ref 38–126)
Anion gap: 10 (ref 5–15)
BUN: <5 mg/dL — ABNORMAL LOW (ref 6–20)
CO2: 25 mmol/L (ref 22–32)
Calcium: 9.7 mg/dL (ref 8.9–10.3)
Chloride: 94 mmol/L — ABNORMAL LOW (ref 101–111)
Creatinine, Ser: 0.74 mg/dL (ref 0.61–1.24)
GFR calc Af Amer: >60 mL/min (ref >60)
GFR calc non Af Amer: >60 mL/min (ref >60)
Glucose, Bld: 114 mg/dL — ABNORMAL HIGH (ref 65–99)
Potassium: 3.1 mmol/L — ABNORMAL LOW (ref 3.5–5.1)
Sodium: 129 mmol/L — ABNORMAL LOW (ref 135–145)
Total Bilirubin: 1.7 mg/dL — ABNORMAL HIGH (ref 0.3–1.2)
Total Protein: 8.5 g/dL — ABNORMAL HIGH (ref 6.5–8.1)

## 2016-03-30 LAB — LIPASE, BLOOD: Lipase: 27 U/L (ref 11–51)

## 2016-03-30 MED ORDER — OMEPRAZOLE 20 MG PO CPDR: 20.0000 mg | DELAYED_RELEASE_CAPSULE | Freq: Every day | ORAL | Status: DC

## 2016-03-30 MED ORDER — SUCRALFATE 1 G PO TABS: 1.0000 g | ORAL_TABLET | Freq: Three times a day (TID) | ORAL | Status: DC
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
## 2016-03-30 NOTE — Discharge Instructions (Signed)
Use Neo-Synephrine 1% nasal spray, one spray in each nostril 4 times a day for 3 days. May also use saline nasal spray every 2-3 hours as needed.   Gastritis, Adult No more alcohol, spicy foods, greasy foods. Drink primarily clear liquids. Take medication as prescribed. Gastritis is soreness and puffiness (inflammation) of the lining of the stomach. If you do not get help, gastritis can cause bleeding and sores (ulcers) in the stomach. HOME CARE   Only take medicine as told by your doctor.  If you were given antibiotic medicines, take them as told. Finish the medicines even if you start to feel better.  Drink enough fluids to keep your pee (urine) clear or pale yellow.  Avoid foods and drinks that make your problems worse. Foods you may want to avoid include:  Caffeine or alcohol.  Chocolate.  Mint.  Garlic and onions.  Spicy foods.  Citrus fruits, including oranges, lemons, or limes.  Food containing tomatoes, including sauce, chili, salsa, and pizza.  Fried and fatty foods.  Eat small meals throughout the day instead of large meals. GET HELP RIGHT AWAY IF:   You have black or dark red poop (stools).  You throw up (vomit) blood. It may look like coffee grounds.  You cannot keep fluids down.  Your belly (abdominal) pain gets worse.  You have a fever.  You do not feel better after 1 week.  You have any other questions or concerns. MAKE SURE YOU:   Understand these instructions.  Will watch your condition.  Will get help right away if you are not doing well or get worse.   This information is not intended to replace advice given to you by your health care provider. Make sure you discuss any questions you have with your health care provider.   Document Released: 04/04/2008 Document Revised: 01/09/2012 Document Reviewed: 11/30/2011 Elsevier Interactive Patient Education 2016 Elsevier [employer]  Nausea and Vomiting Nausea is a sick feeling that often comes before  throwing up (vomiting). Vomiting is a reflex where stomach contents come out of your mouth. Vomiting can cause severe loss of body fluids (dehydration). Children and elderly adults can become dehydrated quickly, especially if they also have diarrhea. Nausea and vomiting are symptoms of a condition or disease. It is important to find the cause of your symptoms. CAUSES   Direct irritation of the stomach lining. This irritation can result from increased acid production (gastroesophageal reflux disease), infection, food poisoning, taking certain medicines (such as nonsteroidal anti-inflammatory drugs), alcohol use, or tobacco use.  Signals from the brain.These signals could be caused by a headache, heat exposure, an inner ear disturbance, increased pressure in the brain from injury, infection, a tumor, or a concussion, pain, emotional stimulus, or metabolic problems.  An obstruction in the gastrointestinal tract (bowel obstruction).  Illnesses such as diabetes, hepatitis, gallbladder problems, appendicitis, kidney problems, cancer, sepsis, atypical symptoms of a heart attack, or eating disorders.  Medical treatments such as chemotherapy and radiation.  Receiving medicine that makes you sleep (general anesthetic) during surgery. DIAGNOSIS Your caregiver may ask for tests to be done if the problems do not improve after a few days. Tests may also be done if symptoms are severe or if the reason for the nausea and vomiting is not clear. Tests may include:  Urine tests.  Blood tests.  Stool tests.  Cultures (to look for evidence of infection).  X-rays or other imaging studies. Test results can help your caregiver make decisions about treatment or the  need for additional tests. TREATMENT You need to stay well hydrated. Drink frequently but in small amounts.You may wish to drink water, sports drinks, clear broth, or eat frozen ice pops or gelatin dessert to help stay hydrated.When you eat, eating  slowly may help prevent nausea.There are also some antinausea medicines that may help prevent nausea. HOME CARE INSTRUCTIONS   Take all medicine as directed by your caregiver.  If you do not have an appetite, do not force yourself to eat. However, you must continue to drink fluids.  If you have an appetite, eat a normal diet unless your caregiver tells you differently.  Eat a variety of complex carbohydrates (rice, wheat, potatoes, bread), lean meats, yogurt, fruits, and vegetables.  Avoid high-fat foods because they are more difficult to digest.  Drink enough water and fluids to keep your urine clear or pale yellow.  If you are dehydrated, ask your caregiver for specific rehydration instructions. Signs of dehydration may include:  Severe thirst.  Dry lips and mouth.  Dizziness.  Dark urine.  Decreasing urine frequency and amount.  Confusion.  Rapid breathing or pulse. SEEK IMMEDIATE MEDICAL CARE IF:   You have blood or brown flecks (like coffee grounds) in your vomit.  You have black or bloody stools.  You have a severe headache or stiff neck.  You are confused.  You have severe abdominal pain.  You have chest pain or trouble breathing.  You do not urinate at least once every 8 hours.  You develop cold or clammy skin.  You continue to vomit for longer than 24 to 48 hours.  You have a fever. MAKE SURE YOU:   Understand these instructions.  Will watch your condition.  Will get help right away if you are not doing well or get worse.   This information is not intended to replace advice given to you by your health care provider. Make sure you discuss any questions you have with your health care provider.   Document Released: 10/17/2005 Document Revised: 01/09/2012 Document Reviewed: 03/16/2011 Elsevier Interactive Patient Education 2016 [place] are common. A nosebleed can be caused by many things, including:  Getting hit  hard in the nose.  Infections.  Dryness in your nose.  A dry climate.  Medicines.  Picking your nose.  Your home heating and cooling systems. HOME CARE   Try controlling your nosebleed by pinching your nostrils gently. Do this for at least 10 minutes.  Avoid blowing or sniffing your nose for a number of hours after having a nosebleed.  Do not put gauze inside of your nose yourself. If your nose was packed by your doctor, try to keep the pack inside of your nose until your doctor removes it.  If a gauze pack was used and it starts to fall out, gently replace it or cut off the end of it.  If a balloon catheter was used to pack your nose, do not cut or remove it unless told by your doctor.  Avoid lying down while you are having a nosebleed. Sit up and lean forward.  Use a nasal spray decongestant to help with a nosebleed as told by your doctor.  Do not use petroleum jelly or mineral oil in your nose. These can drip into your lungs.  Keep your house humid by using:  Less air conditioning.  A humidifier.  Aspirin and blood thinners make bleeding more likely. If you are prescribed these medicines and you have nosebleeds, ask your  doctor if you should stop taking the medicines or adjust the dose. Do not stop medicines unless told by your doctor.  Resume your normal activities as you are able. Avoid straining, lifting, or bending at your waist for several days.  If your nosebleed was caused by dryness in your nose, use over-the-counter saline nasal spray or gel. If you must use a lubricant:  Choose one that is water-soluble.  Use it only as needed.  Do not use it within several hours of lying down.  Keep all follow-up visits as told by your doctor. This is important. GET HELP IF:  You have a fever.  You get frequent nosebleeds.  You are getting nosebleeds more often. GET HELP RIGHT AWAY IF:  Your nosebleed lasts longer than 20 minutes.  Your nosebleed occurs after  an injury to your face, and your nose looks crooked or broken.  You have unusual bleeding from other parts of your body.  You have unusual bruising on other parts of your body.  You feel light-headed or dizzy.  You become sweaty.  You throw up (vomit) blood.  You have a nosebleed after a head injury.   This information is not intended to replace advice given to you by your health care provider. Make sure you discuss any questions you have with your health care provider.   Document Released: 07/26/2008 Document Revised: 11/07/2014 Document Reviewed: 06/02/2014 Elsevier Interactive Patient Education [employer].

## 2016-03-30 NOTE — ED Notes (Signed)
Pt   Reports  Has  Nosebleed  For  Several  Days       Pt  Reports  It  Bled  Some yesterday  But has  Subsided     Today   reports  As   Well  Some  Nausea       Vomiting   And  Upper  abd  Pain    As   Well for  Several  Days        pt  Reports  Feels  Better       Pt   Was  Apparently   In  The  Er  Today    And  Walked    Out   Up  To  Here

## 2016-03-30 NOTE — ED Notes (Signed)
Pt sts nosebleed but not at present and some HA; pt sts N/V x 2 days

## 2016-03-30 NOTE — ED Notes (Signed)
Pt approached nurse first and chose to leave. Pt encouraged to stay but still chose to leave.

## 2016-06-02 ENCOUNTER — Observation Stay (HOSPITAL_COMMUNITY)
Admission: EM | Admit: 2016-06-02 | Discharge: 2016-06-05 | Disposition: A | Discharge status: Home / Self Care | Payer: Medicare Other | Attending: Internal Medicine | Admitting: Internal Medicine

## 2016-06-02 ENCOUNTER — Emergency Department (HOSPITAL_COMMUNITY): Payer: Medicare Other

## 2016-06-02 ENCOUNTER — Encounter (HOSPITAL_COMMUNITY): Payer: Self-pay | Admitting: *Deleted

## 2016-06-02 ENCOUNTER — Ambulatory Visit (INDEPENDENT_AMBULATORY_CARE_PROVIDER_SITE_OTHER)
Admission: EM | Admit: 2016-06-02 | Discharge: 2016-06-02 | Disposition: A | Discharge status: Nursing Home (Includes ICF) | Payer: Medicare Other | Source: Home / Self Care | Attending: Emergency Medicine | Admitting: Emergency Medicine

## 2016-06-02 DIAGNOSIS — Z79899 Other long term (current) drug therapy: Secondary | ICD-10-CM | POA: Diagnosis not present

## 2016-06-02 DIAGNOSIS — R1013 Epigastric pain: Secondary | ICD-10-CM

## 2016-06-02 DIAGNOSIS — R112 Nausea with vomiting, unspecified: Secondary | ICD-10-CM | POA: Insufficient documentation

## 2016-06-02 DIAGNOSIS — Z96652 Presence of left artificial knee joint: Secondary | ICD-10-CM | POA: Insufficient documentation

## 2016-06-02 DIAGNOSIS — K8689 Other specified diseases of pancreas: Secondary | ICD-10-CM | POA: Diagnosis not present

## 2016-06-02 DIAGNOSIS — R1084 Generalized abdominal pain: Secondary | ICD-10-CM | POA: Insufficient documentation

## 2016-06-02 DIAGNOSIS — W3400XA Accidental discharge from unspecified firearms or gun, initial encounter: Secondary | ICD-10-CM

## 2016-06-02 DIAGNOSIS — Z189 Retained foreign body fragments, unspecified material: Secondary | ICD-10-CM

## 2016-06-02 DIAGNOSIS — R109 Unspecified abdominal pain: Secondary | ICD-10-CM | POA: Diagnosis present

## 2016-06-02 DIAGNOSIS — J45909 Unspecified asthma, uncomplicated: Secondary | ICD-10-CM | POA: Diagnosis not present

## 2016-06-02 DIAGNOSIS — I10 Essential (primary) hypertension: Secondary | ICD-10-CM | POA: Insufficient documentation

## 2016-06-02 DIAGNOSIS — F1010 Alcohol abuse, uncomplicated: Secondary | ICD-10-CM | POA: Diagnosis present

## 2016-06-02 DIAGNOSIS — E876 Hypokalemia: Secondary | ICD-10-CM | POA: Diagnosis present

## 2016-06-02 LAB — COMPREHENSIVE METABOLIC PANEL
ALT: 48 U/L (ref 17–63)
AST: 107 U/L — ABNORMAL HIGH (ref 15–41)
Albumin: 4 g/dL (ref 3.5–5.0)
Alkaline Phosphatase: 118 U/L (ref 38–126)
Anion gap: 16 — ABNORMAL HIGH (ref 5–15)
BUN: <5 mg/dL — ABNORMAL LOW (ref 6–20)
CO2: 20 mmol/L — ABNORMAL LOW (ref 22–32)
Calcium: 9.4 mg/dL (ref 8.9–10.3)
Chloride: 95 mmol/L — ABNORMAL LOW (ref 101–111)
Creatinine, Ser: 0.72 mg/dL (ref 0.61–1.24)
GFR calc Af Amer: >60 mL/min (ref >60)
GFR calc non Af Amer: >60 mL/min (ref >60)
Glucose, Bld: 130 mg/dL — ABNORMAL HIGH (ref 65–99)
Potassium: 2.9 mmol/L — ABNORMAL LOW (ref 3.5–5.1)
Sodium: 131 mmol/L — ABNORMAL LOW (ref 135–145)
Total Bilirubin: 0.5 mg/dL (ref 0.3–1.2)
Total Protein: 8.9 g/dL — ABNORMAL HIGH (ref 6.5–8.1)

## 2016-06-02 LAB — URINALYSIS, ROUTINE W REFLEX MICROSCOPIC
Bilirubin Urine: NEGATIVE
Glucose, UA: NEGATIVE mg/dL
Hgb urine dipstick: NEGATIVE
Ketones, ur: NEGATIVE mg/dL
Leukocytes, UA: NEGATIVE
Nitrite: NEGATIVE
Protein, ur: NEGATIVE mg/dL
Specific Gravity, Urine: 1.015 (ref 1.005–1.030)
pH: 7.5 (ref 5.0–8.0)

## 2016-06-02 LAB — CBC
HCT: 40.4 % (ref 39.0–52.0)
Hemoglobin: 14 g/dL (ref 13.0–17.0)
MCH: 29.5 pg (ref 26.0–34.0)
MCHC: 34.7 g/dL (ref 30.0–36.0)
MCV: 85.1 fL (ref 78.0–100.0)
Platelets: 238 10*3/uL (ref 150–400)
RBC: 4.75 MIL/uL (ref 4.22–5.81)
RDW: 13.5 % (ref 11.5–15.5)
WBC: 5.5 10*3/uL (ref 4.0–10.5)

## 2016-06-02 LAB — LIPASE, BLOOD: Lipase: 66 U/L — ABNORMAL HIGH (ref 11–51)

## 2016-06-02 LAB — ETHANOL: Alcohol, Ethyl (B): 70 mg/dL — ABNORMAL HIGH (ref <5)

## 2016-06-02 MED ORDER — ONDANSETRON HCL 4 MG/2ML IJ SOLN
4.0000 mg | Freq: Once | INTRAMUSCULAR | Status: AC
  Administered 2016-06-02: 4 mg via INTRAVENOUS

## 2016-06-02 MED ORDER — HYDROMORPHONE HCL 1 MG/ML IJ SOLN
INTRAMUSCULAR | Status: AC
  Filled 2016-06-02: qty 1

## 2016-06-02 MED ORDER — ONDANSETRON HCL 4 MG/2ML IJ SOLN
INTRAMUSCULAR | Status: AC
  Filled 2016-06-02: qty 2

## 2016-06-02 MED ORDER — SODIUM CHLORIDE 0.9 % IV BOLUS (SEPSIS)
1000.0000 mL | Freq: Once | INTRAVENOUS | Status: AC
  Administered 2016-06-02: 1000 mL via INTRAVENOUS

## 2016-06-02 MED ORDER — IOPAMIDOL (ISOVUE-300) INJECTION 61%
INTRAVENOUS | Status: AC
  Administered 2016-06-03: 100 mL
  Filled 2016-06-02: qty 100

## 2016-06-02 MED ORDER — PROCHLORPERAZINE EDISYLATE 5 MG/ML IJ SOLN
10.0000 mg | Freq: Once | INTRAMUSCULAR | Status: AC
  Administered 2016-06-02: 10 mg via INTRAVENOUS
  Filled 2016-06-02: qty 2

## 2016-06-02 MED ORDER — FAMOTIDINE IN NACL 20-0.9 MG/50ML-% IV SOLN
20.0000 mg | Freq: Once | INTRAVENOUS | Status: AC
  Administered 2016-06-02: 20 mg via INTRAVENOUS
  Filled 2016-06-02: qty 50

## 2016-06-02 MED ORDER — HYDROMORPHONE HCL 1 MG/ML IJ SOLN
1.0000 mg | Freq: Once | INTRAMUSCULAR | Status: AC
  Administered 2016-06-02: 1 mg via INTRAVENOUS
  Filled 2016-06-02: qty 1

## 2016-06-02 MED ORDER — HYDROMORPHONE HCL 1 MG/ML IJ SOLN
1.0000 mg | Freq: Once | INTRAMUSCULAR | Status: AC
  Administered 2016-06-02: 1 mg via INTRAMUSCULAR

## 2016-06-02 MED ORDER — ONDANSETRON HCL 4 MG/2ML IJ SOLN
4.0000 mg | Freq: Once | INTRAMUSCULAR | Status: AC
  Administered 2016-06-02: 4 mg via INTRAMUSCULAR

## 2016-06-02 NOTE — ED Triage Notes (Signed)
Pt/o abd pain/N/V x 3 hours. States that abd pain started after eating asian. Pt went to urgent care and was given IM zofran and dilaudid and IV zofran prior to arrival to ED.
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
## 2016-06-02 NOTE — ED Notes (Signed)
Report  Phoned to  [facility]    Nurse  firest

## 2016-06-03 ENCOUNTER — Emergency Department (HOSPITAL_COMMUNITY): Payer: Medicare Other

## 2016-06-03 ENCOUNTER — Encounter (HOSPITAL_COMMUNITY): Payer: Self-pay | Admitting: Radiology

## 2016-06-03 ENCOUNTER — Observation Stay (HOSPITAL_COMMUNITY): Payer: Medicare Other

## 2016-06-03 DIAGNOSIS — K869 Disease of pancreas, unspecified: Secondary | ICD-10-CM | POA: Diagnosis not present

## 2016-06-03 DIAGNOSIS — F1010 Alcohol abuse, uncomplicated: Secondary | ICD-10-CM | POA: Diagnosis not present

## 2016-06-03 DIAGNOSIS — E876 Hypokalemia: Secondary | ICD-10-CM | POA: Diagnosis present

## 2016-06-03 DIAGNOSIS — R109 Unspecified abdominal pain: Secondary | ICD-10-CM | POA: Diagnosis present

## 2016-06-03 DIAGNOSIS — K8689 Other specified diseases of pancreas: Secondary | ICD-10-CM | POA: Diagnosis present

## 2016-06-03 DIAGNOSIS — I10 Essential (primary) hypertension: Secondary | ICD-10-CM | POA: Diagnosis present

## 2016-06-03 LAB — CBC WITH DIFFERENTIAL/PLATELET
Basophils Absolute: 0 10*3/uL (ref 0.0–0.1)
Basophils Relative: 0 %
Eosinophils Absolute: 0 10*3/uL (ref 0.0–0.7)
Eosinophils Relative: 0 %
HCT: 38.2 % — ABNORMAL LOW (ref 39.0–52.0)
Hemoglobin: 13 g/dL (ref 13.0–17.0)
Lymphocytes Relative: 10 %
Lymphs Abs: 0.8 10*3/uL (ref 0.7–4.0)
MCH: 29.6 pg (ref 26.0–34.0)
MCHC: 34 g/dL (ref 30.0–36.0)
MCV: 87 fL (ref 78.0–100.0)
Monocytes Absolute: 0.7 10*3/uL (ref 0.1–1.0)
Monocytes Relative: 10 %
Neutro Abs: 5.9 10*3/uL (ref 1.7–7.7)
Neutrophils Relative %: 80 %
Platelets: 220 10*3/uL (ref 150–400)
RBC: 4.39 MIL/uL (ref 4.22–5.81)
RDW: 13.6 % (ref 11.5–15.5)
WBC: 7.4 10*3/uL (ref 4.0–10.5)

## 2016-06-03 LAB — BASIC METABOLIC PANEL
Anion gap: 10 (ref 5–15)
BUN: <5 mg/dL — ABNORMAL LOW (ref 6–20)
CO2: 26 mmol/L (ref 22–32)
Calcium: 8.6 mg/dL — ABNORMAL LOW (ref 8.9–10.3)
Chloride: 97 mmol/L — ABNORMAL LOW (ref 101–111)
Creatinine, Ser: 0.57 mg/dL — ABNORMAL LOW (ref 0.61–1.24)
GFR calc Af Amer: >60 mL/min (ref >60)
GFR calc non Af Amer: >60 mL/min (ref >60)
Glucose, Bld: 143 mg/dL — ABNORMAL HIGH (ref 65–99)
Potassium: 2.8 mmol/L — ABNORMAL LOW (ref 3.5–5.1)
Sodium: 133 mmol/L — ABNORMAL LOW (ref 135–145)

## 2016-06-03 LAB — RAPID URINE DRUG SCREEN, HOSP PERFORMED
Amphetamines: NOT DETECTED
Barbiturates: NOT DETECTED
Benzodiazepines: NOT DETECTED
Cocaine: NOT DETECTED
Opiates: NOT DETECTED
Tetrahydrocannabinol: NOT DETECTED

## 2016-06-03 LAB — I-STAT CG4 LACTIC ACID, ED: Lactic Acid, Venous: 2.87 mmol/L (ref 0.5–1.9)

## 2016-06-03 LAB — HEPATIC FUNCTION PANEL
ALT: 43 U/L (ref 17–63)
AST: 81 U/L — ABNORMAL HIGH (ref 15–41)
Albumin: 3.6 g/dL (ref 3.5–5.0)
Alkaline Phosphatase: 110 U/L (ref 38–126)
Bilirubin, Direct: 0.2 mg/dL (ref 0.1–0.5)
Indirect Bilirubin: 0.7 mg/dL (ref 0.3–0.9)
Total Bilirubin: 0.9 mg/dL (ref 0.3–1.2)
Total Protein: 8.1 g/dL (ref 6.5–8.1)

## 2016-06-03 LAB — LIPASE, BLOOD: Lipase: 35 U/L (ref 11–51)

## 2016-06-03 LAB — LACTIC ACID, PLASMA: Lactic Acid, Venous: 1.1 mmol/L (ref 0.5–1.9)

## 2016-06-03 MED ORDER — HYDROMORPHONE HCL 1 MG/ML IJ SOLN
0.5000 mg | INTRAMUSCULAR | Status: DC | PRN
  Administered 2016-06-03 (×2): 0.5 mg via INTRAVENOUS
  Filled 2016-06-03 (×2): qty 1

## 2016-06-03 MED ORDER — ADULT MULTIVITAMIN W/MINERALS CH
1.0000 | ORAL_TABLET | Freq: Every day | ORAL | Status: DC
  Administered 2016-06-03 – 2016-06-05 (×3): 1 via ORAL
  Filled 2016-06-03 (×3): qty 1

## 2016-06-03 MED ORDER — ONDANSETRON HCL 4 MG PO TABS: 4.0000 mg | ORAL_TABLET | Freq: Four times a day (QID) | ORAL | Status: DC | PRN

## 2016-06-03 MED ORDER — LORAZEPAM 2 MG/ML IJ SOLN: 0.0000 mg | Freq: Two times a day (BID) | INTRAMUSCULAR | Status: DC

## 2016-06-03 MED ORDER — LORAZEPAM 1 MG PO TABS: 1.0000 mg | ORAL_TABLET | Freq: Four times a day (QID) | ORAL | Status: DC | PRN

## 2016-06-03 MED ORDER — POTASSIUM CHLORIDE 10 MEQ/100ML IV SOLN
10.0000 meq | INTRAVENOUS | Status: AC
  Administered 2016-06-03 (×2): 10 meq via INTRAVENOUS
  Filled 2016-06-03 (×2): qty 100

## 2016-06-03 MED ORDER — THIAMINE HCL 100 MG/ML IJ SOLN
100.0000 mg | Freq: Every day | INTRAMUSCULAR | Status: DC
  Filled 2016-06-03 (×2): qty 2

## 2016-06-03 MED ORDER — FOLIC ACID 1 MG PO TABS
1.0000 mg | ORAL_TABLET | Freq: Every day | ORAL | Status: DC
  Administered 2016-06-03 – 2016-06-05 (×3): 1 mg via ORAL
  Filled 2016-06-03 (×3): qty 1

## 2016-06-03 MED ORDER — HYDROMORPHONE HCL 1 MG/ML IJ SOLN
1.0000 mg | Freq: Once | INTRAMUSCULAR | Status: AC
  Administered 2016-06-03: 1 mg via INTRAVENOUS
  Filled 2016-06-03: qty 1

## 2016-06-03 MED ORDER — ACETAMINOPHEN 650 MG RE SUPP: 650.0000 mg | Freq: Four times a day (QID) | RECTAL | Status: DC | PRN

## 2016-06-03 MED ORDER — VITAMIN B-1 100 MG PO TABS
100.0000 mg | ORAL_TABLET | Freq: Every day | ORAL | Status: DC
  Administered 2016-06-03 – 2016-06-05 (×3): 100 mg via ORAL
  Filled 2016-06-03 (×3): qty 1

## 2016-06-03 MED ORDER — LORAZEPAM 2 MG/ML IJ SOLN
0.0000 mg | Freq: Four times a day (QID) | INTRAMUSCULAR | Status: AC
  Administered 2016-06-03: 2 mg via INTRAVENOUS
  Filled 2016-06-03: qty 1

## 2016-06-03 MED ORDER — AMLODIPINE BESYLATE 5 MG PO TABS
10.0000 mg | ORAL_TABLET | Freq: Every day | ORAL | Status: DC
  Administered 2016-06-03 – 2016-06-05 (×3): 10 mg via ORAL
  Filled 2016-06-03 (×3): qty 2

## 2016-06-03 MED ORDER — TRAZODONE HCL 50 MG PO TABS
50.0000 mg | ORAL_TABLET | Freq: Every evening | ORAL | Status: DC | PRN
  Administered 2016-06-04: 50 mg via ORAL
  Filled 2016-06-03: qty 1

## 2016-06-03 MED ORDER — SODIUM CHLORIDE 0.9 % IV SOLN: INTRAVENOUS | Status: DC

## 2016-06-03 MED ORDER — POTASSIUM CHLORIDE IN NACL 20-0.9 MEQ/L-% IV SOLN
INTRAVENOUS | Status: AC
  Administered 2016-06-03: 1000 mL via INTRAVENOUS
  Filled 2016-06-03 (×2): qty 1000

## 2016-06-03 MED ORDER — LORAZEPAM 2 MG/ML IJ SOLN: 1.0000 mg | Freq: Four times a day (QID) | INTRAMUSCULAR | Status: DC | PRN

## 2016-06-03 MED ORDER — ONDANSETRON HCL 4 MG/2ML IJ SOLN: 4.0000 mg | Freq: Four times a day (QID) | INTRAMUSCULAR | Status: DC | PRN

## 2016-06-03 MED ORDER — SODIUM CHLORIDE 0.9 % IV BOLUS (SEPSIS)
1000.0000 mL | INTRAVENOUS | Status: AC
  Administered 2016-06-03: 1000 mL via INTRAVENOUS

## 2016-06-03 MED ORDER — ACETAMINOPHEN 325 MG PO TABS: 650.0000 mg | ORAL_TABLET | Freq: Four times a day (QID) | ORAL | Status: DC | PRN

## 2016-06-03 NOTE — Progress Notes (Signed)
Patient ID: Chad Turner, male   DOB: 03-03-1961, 55 y.o.   MRN: [id number] Patient admitted after midnight. For details, please refer to admission note done 06/02/2016.  55 y.o. male with past medical history significant for hypertension who presented to [facility] with worsening abdominal pain started yesterday, in epigastric area, constant, made worse about 8 or 9 out of 10 in intensity, nonradiating. Patient did not have associated fevers or nausea or vomiting or diarrhea. On admission, patient was found to have pancreatic mass as evident on CT abdomen. Surgery will see the patient in consultation.  Assessment and plan:  Abdominal pain with pancreatic mass - Appreciate surgery recommendations - Continue pain management efforts - Continue IV fluids for hydration  Leisa Lenz [facility] [id number]
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
## 2016-06-03 NOTE — Progress Notes (Signed)
Mri attempted pt is extremely claus, will need meds in order to get exam done, but pt needs to stay awake to hold his breathe throughout exam, notified rn and told her that pt needs to be NPO, spoke to third shift who was aware that pt had to be NPO and pt was suppose to be NPO starting at 3:30am this morning, pt stated that he had broth 30 mins before arriving to MRI
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
## 2016-06-03 NOTE — Progress Notes (Signed)
MRI called to notify nurse that pt could not tolerate closed MRI as he is severely claustephobic. MRI also notified nurse that pt needs to be npo for a few hours before the scan. Will notify MD.

## 2016-06-03 NOTE — Care Management Obs Status (Signed)
[facility] NOTIFICATION   Patient Details  Name: Chad Turner MRN: [id number] Date of Birth: [birth year]   Medicare Observation Status Notification Given:  Yes    Kylea Berrong, Rory Percy, RN 06/03/2016, 2:53 PM

## 2016-06-03 NOTE — ED Notes (Signed)
PA at bedside.

## 2016-06-04 ENCOUNTER — Observation Stay (HOSPITAL_COMMUNITY): Payer: Medicare Other

## 2016-06-04 DIAGNOSIS — K8689 Other specified diseases of pancreas: Secondary | ICD-10-CM | POA: Diagnosis not present

## 2016-06-04 DIAGNOSIS — R1013 Epigastric pain: Secondary | ICD-10-CM | POA: Diagnosis not present

## 2016-06-04 DIAGNOSIS — E876 Hypokalemia: Secondary | ICD-10-CM

## 2016-06-04 DIAGNOSIS — I10 Essential (primary) hypertension: Secondary | ICD-10-CM | POA: Diagnosis not present

## 2016-06-04 DIAGNOSIS — K869 Disease of pancreas, unspecified: Secondary | ICD-10-CM | POA: Diagnosis not present

## 2016-06-04 MED ORDER — POTASSIUM CHLORIDE 10 MEQ/100ML IV SOLN
10.0000 meq | INTRAVENOUS | Status: AC
  Administered 2016-06-04 (×2): 10 meq via INTRAVENOUS
  Filled 2016-06-04 (×3): qty 100

## 2016-06-04 MED ORDER — POTASSIUM CHLORIDE 10 MEQ/100ML IV SOLN
10.0000 meq | INTRAVENOUS | Status: AC
  Administered 2016-06-04 (×2): 10 meq via INTRAVENOUS
  Filled 2016-06-04 (×2): qty 100

## 2016-06-04 MED ORDER — LORAZEPAM 2 MG/ML IJ SOLN
1.0000 mg | Freq: Once | INTRAMUSCULAR | Status: AC
  Administered 2016-06-04: 1 mg via INTRAVENOUS
  Filled 2016-06-04: qty 1

## 2016-06-04 MED ORDER — GADOBENATE DIMEGLUMINE 529 MG/ML IV SOLN
20.0000 mL | Freq: Once | INTRAVENOUS | Status: AC | PRN
  Administered 2016-06-04: 20 mL via INTRAVENOUS

## 2016-06-04 NOTE — Progress Notes (Signed)
Called MRI and clarified pt will be going over the next couple of hours. Dr Charlies Silvers Notified. Order for sedation given. Pt to receive 1mg  of Ativan IV when MRI notify nurse they on are their way to get him.

## 2016-06-04 NOTE — Progress Notes (Signed)
Patient ID: Chad Turner, male   DOB: [birth year], 55 y.o.   MRN: [id number]  PROGRESS NOTE    Chad Turner  [id number] DOB: September 02, 1961 DOA: 06/02/2016  PCP: [facility]   Brief Narrative:    55 y.o.malewith past medical history significant for hypertension who presented to [facility] with worsening abdominal pain started yesterday, in epigastric area, constant, made worse about 8 or 9 out of 10 in intensity, nonradiating. Patient did not have associated fevers or nausea or vomiting or diarrhea. On admission, patient was found to have pancreatic mass as evident on CT abdomen. Surgery has seen the patient in consultation.  Assessment & Plan:  Abdominal pain with pancreatic mass - Appreciate surgery recommendations - Plan for MRI today  - IR consulted for endoscopic biopsy  - Continue pain management efforts - Continue IV fluids for hydration  Essential hypertension - Continue Norvasc daily   Hypokalemia - Due to GI related issue, pancreas mass - Supplemented - Check Magnesium in am - Check BMP in am   DVT prophylaxis: SCD's bilaterally  Code Status: full code  Family Communication: no family at the bedside this am Disposition Plan: home once work up for pancreatic mass completed; MRI planned for today    Consultants:   Surgery  IR  Procedures:   None   Antimicrobials:   None    Subjective: No overnight events.   Objective: Vitals:   06/03/16 0716 06/03/16 2132 06/04/16 0520 06/04/16 0935  BP: 134/84 127/74 (!) 147/95 (!) 132/93  Pulse: 93 90 97 97  Resp:  16 18 18   Temp: 97.9 F (36.6 C) 99.2 F (37.3 C) 98.9 F (37.2 C) 98.2 F (36.8 C)  TempSrc: Oral Oral Oral Oral  SpO2: 94% 94% 100% 94%    Intake/Output Summary (Last 24 hours) at 06/04/16 0957 Last data filed at 06/04/16 0935  Gross per 24 hour  Intake             1325 ml  Output             2020 ml  Net             -695 ml   There were no vitals  filed for this visit.  Examination:  General exam: Appears calm and comfortable  Respiratory system: Clear to auscultation. Respiratory effort normal. Cardiovascular system: S1 & S2 heard, RRR. No JVD, murmurs, rubs, gallops or clicks. No pedal edema. Gastrointestinal system: Abdomen is nondistended, soft and nontender. No organomegaly or masses felt. Normal bowel sounds heard. Central nervous system: Alert and oriented. No focal neurological deficits. Extremities: Symmetric 5 x 5 power. Skin: No rashes, lesions or ulcers Psychiatry: Judgement and insight appear normal. Mood & affect appropriate.   Data Reviewed: I have personally reviewed following labs and imaging studies  CBC:  Recent Labs Lab 06/02/16 2049 06/03/16 0336  WBC 5.5 7.4  NEUTROABS  --  5.9  HGB 14.0 13.0  HCT 40.4 38.2*  MCV 85.1 87.0  PLT 238 [id number]   Basic Metabolic Panel:  Recent Labs Lab 06/02/16 2049 06/03/16 0336  NA 131* 133*  K 2.9* 2.8*  CL 95* 97*  CO2 20* 26  GLUCOSE 130* 143*  BUN <5* <5*  CREATININE 0.72 0.57*  CALCIUM 9.4 8.6*   GFR: CrCl cannot be calculated (Unknown ideal weight.). Liver Function Tests:  Recent Labs Lab 06/02/16 2049 06/03/16 0336  AST 107* 81*  ALT 48 43  ALKPHOS 118 110  BILITOT 0.5 0.9  PROT 8.9* 8.1  ALBUMIN 4.0 3.6    Recent Labs Lab 06/02/16 2049 06/03/16 0336  LIPASE 66* 35   No results for input(s): AMMONIA in the last 168 hours. Coagulation Profile: No results for input(s): INR, PROTIME in the last 168 hours. Cardiac Enzymes: No results for input(s): CKTOTAL, CKMB, CKMBINDEX, TROPONINI in the last 168 hours. BNP (last 3 results) No results for input(s): PROBNP in the last 8760 hours. HbA1C: No results for input(s): HGBA1C in the last 72 hours. CBG: No results for input(s): GLUCAP in the last 168 hours. Lipid Profile: No results for input(s): CHOL, HDL, LDLCALC, TRIG, CHOLHDL, LDLDIRECT in the last 72 hours. Thyroid Function Tests: No  results for input(s): TSH, T4TOTAL, FREET4, T3FREE, THYROIDAB in the last 72 hours. Anemia Panel: No results for input(s): VITAMINB12, FOLATE, FERRITIN, TIBC, IRON, RETICCTPCT in the last 72 hours. Urine analysis:    Component Value Date/Time   COLORURINE YELLOW 06/02/2016 2104   APPEARANCEUR CLEAR 06/02/2016 2104   LABSPEC 1.015 06/02/2016 2104   PHURINE 7.5 06/02/2016 2104   GLUCOSEU NEGATIVE 06/02/2016 2104   HGBUR NEGATIVE 06/02/2016 2104   BILIRUBINUR NEGATIVE 06/02/2016 2104   KETONESUR NEGATIVE 06/02/2016 2104   PROTEINUR NEGATIVE 06/02/2016 2104   UROBILINOGEN 1.0 09/18/2015 1459   NITRITE NEGATIVE 06/02/2016 2104   LEUKOCYTESUR NEGATIVE 06/02/2016 2104   Sepsis Labs: @LABRCNTIP (procalcitonin:4,lacticidven:4)   )No results found for this or any previous visit (from the past 240 hour(s)).    Radiology Studies: Ct Abdomen Pelvis W Contrast Result Date: 06/03/2016 CLINICAL DATA:   Hepatomegaly with fatty infiltration of the liver and morphologic changes of cirrhosis. Set Well-circumscribed mass arising from the posterior surface of the head and uncinate process of the pancreas as described. MRI without and with contrast is recommended for further characterization. No evidence of bowel obstruction or active inflammation.   Dg Abd Acute W/chest Result Date: 06/02/2016 CLINICAL DATA:   No acute cardiopulmonary process, stable mild chronic bronchitic changes. Gas distended small bowel concerning for small bowel obstruction.   Dg Knee 2 Views Right Result Date: 06/03/2016 CLINICAL DATA:1. No metallic foreign bodies in the vicinity of the right knee. 2. No osseous abnormalities. 3. Femoropopliteal and tibioperoneal artery atherosclerosis.     Scheduled Meds: . amLODipine  10 mg Oral Daily  . folic acid  1 mg Oral Daily  . LORazepam  0-4 mg Intravenous Q6H   Followed by  . [START ON 06/05/2016] LORazepam  0-4 mg Intravenous Q12H  . multivitamin with minerals  1 tablet Oral Daily    . potassium chloride  10 mEq Intravenous Q1 Hr x 4  . thiamine  100 mg Oral Daily   Or  . thiamine  100 mg Intravenous Daily   Continuous Infusions:    LOS: 0 days    Time spent: 25 minutes  Greater than 50% of the time spent on counseling and coordinating the care.   Leisa Lenz, MD [facility] Pager [phone]  If 7PM-7AM, please contact night-coverage [email or web address] Password [email or web address] 06/04/2016, 9:57 AM

## 2016-06-05 DIAGNOSIS — E876 Hypokalemia: Secondary | ICD-10-CM | POA: Diagnosis not present

## 2016-06-05 DIAGNOSIS — I10 Essential (primary) hypertension: Secondary | ICD-10-CM | POA: Diagnosis not present

## 2016-06-05 DIAGNOSIS — K869 Disease of pancreas, unspecified: Secondary | ICD-10-CM | POA: Diagnosis not present

## 2016-06-05 DIAGNOSIS — K8689 Other specified diseases of pancreas: Secondary | ICD-10-CM | POA: Diagnosis not present

## 2016-06-05 LAB — CBC
HCT: 41.9 % (ref 39.0–52.0)
Hemoglobin: 14.1 g/dL (ref 13.0–17.0)
MCH: 29.6 pg (ref 26.0–34.0)
MCHC: 33.7 g/dL (ref 30.0–36.0)
MCV: 87.8 fL (ref 78.0–100.0)
Platelets: 228 10*3/uL (ref 150–400)
RBC: 4.77 MIL/uL (ref 4.22–5.81)
RDW: 13.4 % (ref 11.5–15.5)
WBC: 8.1 10*3/uL (ref 4.0–10.5)

## 2016-06-05 LAB — BASIC METABOLIC PANEL
Anion gap: 10 (ref 5–15)
Anion gap: 10 (ref 5–15)
BUN: <5 mg/dL — ABNORMAL LOW (ref 6–20)
BUN: <5 mg/dL — ABNORMAL LOW (ref 6–20)
CO2: 21 mmol/L — ABNORMAL LOW (ref 22–32)
CO2: 21 mmol/L — ABNORMAL LOW (ref 22–32)
Calcium: 9.8 mg/dL (ref 8.9–10.3)
Calcium: 9.8 mg/dL (ref 8.9–10.3)
Chloride: 100 mmol/L — ABNORMAL LOW (ref 101–111)
Chloride: 99 mmol/L — ABNORMAL LOW (ref 101–111)
Creatinine, Ser: 0.68 mg/dL (ref 0.61–1.24)
Creatinine, Ser: 0.7 mg/dL (ref 0.61–1.24)
GFR calc Af Amer: >60 mL/min (ref >60)
GFR calc Af Amer: >60 mL/min (ref >60)
GFR calc non Af Amer: >60 mL/min (ref >60)
GFR calc non Af Amer: >60 mL/min (ref >60)
Glucose, Bld: 103 mg/dL — ABNORMAL HIGH (ref 65–99)
Glucose, Bld: 103 mg/dL — ABNORMAL HIGH (ref 65–99)
Potassium: 3.9 mmol/L (ref 3.5–5.1)
Potassium: 4 mmol/L (ref 3.5–5.1)
Sodium: 130 mmol/L — ABNORMAL LOW (ref 135–145)
Sodium: 131 mmol/L — ABNORMAL LOW (ref 135–145)

## 2016-06-05 LAB — MAGNESIUM
Magnesium: 1.7 mg/dL (ref 1.7–2.4)
Magnesium: 1.7 mg/dL (ref 1.7–2.4)

## 2016-06-05 NOTE — Progress Notes (Signed)
Rolan Lipa to be D/C'd Home per MD order.  Discussed prescriptions and follow up appointments with the patient. Prescriptions given to patient, medication list explained in detail. Pt verbalized understanding.    Medication List    TAKE these medications   amLODipine 10 MG tablet Commonly known as:  NORVASC Take 10 mg by mouth daily.   hydrochlorothiazide 12.5 MG tablet Commonly known as:  HYDRODIURIL Take 12.5 mg by mouth daily.   omeprazole 20 MG capsule Commonly known as:  PRILOSEC Take 1 capsule (20 mg total) by mouth daily.   sucralfate 1 g tablet Commonly known as:  CARAFATE Take 1 tablet (1 g total) by mouth 4 (four) times daily -  with meals and at bedtime. What changed:  when to take this  reasons to take this   traZODone 50 MG tablet Commonly known as:  DESYREL Take 1 tablet (50 mg total) by mouth at bedtime as needed for sleep. For sleep       Vitals:   06/05/16 0438 06/05/16 0858  BP: 120/81 110/76  Pulse: 94 94  Resp: 18 18  Temp: 98.2 F (36.8 C) 98.4 F (36.9 C)    Skin clean, dry and intact without evidence of skin break down, no evidence of skin tears noted. IV catheter discontinued intact. Site without signs and symptoms of complications. Dressing and pressure applied. Pt denies pain at this time. No complaints noted.  An After Visit Summary was printed and given to the patient. Patient escorted via [place], and D/C home via private auto.  Haywood Lasso BSN, RN [facility] Phone [phone]

## 2016-06-05 NOTE — Discharge Instructions (Signed)
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
## 2016-06-22 ENCOUNTER — Encounter: Payer: Self-pay | Admitting: Primary Care

## 2017-05-04 ENCOUNTER — Ambulatory Visit (HOSPITAL_COMMUNITY)
Admission: EM | Admit: 2017-05-04 | Discharge: 2017-05-04 | Discharge status: Against Medical Advice | Payer: Medicare Other

## 2017-05-05 ENCOUNTER — Ambulatory Visit (HOSPITAL_COMMUNITY)
Admission: EM | Admit: 2017-05-05 | Discharge: 2017-05-05 | Disposition: A | Discharge status: Home / Self Care | Payer: Medicare Other | Attending: Internal Medicine | Admitting: Internal Medicine

## 2017-05-05 ENCOUNTER — Encounter (HOSPITAL_COMMUNITY): Payer: Self-pay | Admitting: *Deleted

## 2017-05-05 DIAGNOSIS — R609 Edema, unspecified: Secondary | ICD-10-CM | POA: Diagnosis not present
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
## 2017-05-05 NOTE — ED Triage Notes (Signed)
Pt  Reports   Swelling     Of  Both  Legs   And  Ankles   X  2  Days    Never    Had  This  Before       No other  Symptoms

## 2017-12-11 ENCOUNTER — Encounter (HOSPITAL_COMMUNITY): Payer: Self-pay

## 2017-12-11 ENCOUNTER — Observation Stay (HOSPITAL_COMMUNITY)
Admission: EM | Admit: 2017-12-11 | Discharge: 2017-12-13 | Disposition: A | Discharge status: Home / Self Care | Payer: Medicare HMO | Attending: Internal Medicine | Admitting: Internal Medicine

## 2017-12-11 ENCOUNTER — Other Ambulatory Visit: Payer: Self-pay

## 2017-12-11 DIAGNOSIS — R112 Nausea with vomiting, unspecified: Secondary | ICD-10-CM | POA: Diagnosis present

## 2017-12-11 DIAGNOSIS — Z79899 Other long term (current) drug therapy: Secondary | ICD-10-CM | POA: Insufficient documentation

## 2017-12-11 DIAGNOSIS — Z880 Allergy status to penicillin: Secondary | ICD-10-CM | POA: Insufficient documentation

## 2017-12-11 DIAGNOSIS — K760 Fatty (change of) liver, not elsewhere classified: Secondary | ICD-10-CM | POA: Insufficient documentation

## 2017-12-11 DIAGNOSIS — E876 Hypokalemia: Secondary | ICD-10-CM | POA: Diagnosis present

## 2017-12-11 DIAGNOSIS — Z550 Illiteracy and low-level literacy: Secondary | ICD-10-CM | POA: Diagnosis not present

## 2017-12-11 DIAGNOSIS — J45909 Unspecified asthma, uncomplicated: Secondary | ICD-10-CM | POA: Insufficient documentation

## 2017-12-11 DIAGNOSIS — R160 Hepatomegaly, not elsewhere classified: Secondary | ICD-10-CM | POA: Diagnosis not present

## 2017-12-11 DIAGNOSIS — F10230 Alcohol dependence with withdrawal, uncomplicated: Secondary | ICD-10-CM | POA: Insufficient documentation

## 2017-12-11 DIAGNOSIS — K7460 Unspecified cirrhosis of liver: Secondary | ICD-10-CM | POA: Diagnosis not present

## 2017-12-11 DIAGNOSIS — Y903 Blood alcohol level of 60-79 mg/100 ml: Secondary | ICD-10-CM | POA: Diagnosis not present

## 2017-12-11 DIAGNOSIS — R000 Tachycardia, unspecified: Secondary | ICD-10-CM | POA: Diagnosis not present

## 2017-12-11 DIAGNOSIS — F10229 Alcohol dependence with intoxication, unspecified: Secondary | ICD-10-CM | POA: Diagnosis not present

## 2017-12-11 DIAGNOSIS — I10 Essential (primary) hypertension: Secondary | ICD-10-CM | POA: Diagnosis present

## 2017-12-11 DIAGNOSIS — Z96652 Presence of left artificial knee joint: Secondary | ICD-10-CM | POA: Diagnosis not present

## 2017-12-11 DIAGNOSIS — F329 Major depressive disorder, single episode, unspecified: Secondary | ICD-10-CM | POA: Insufficient documentation

## 2017-12-11 DIAGNOSIS — K2920 Alcoholic gastritis without bleeding: Principal | ICD-10-CM | POA: Insufficient documentation

## 2017-12-11 DIAGNOSIS — F10930 Alcohol use, unspecified with withdrawal, uncomplicated: Secondary | ICD-10-CM

## 2017-12-11 DIAGNOSIS — K8689 Other specified diseases of pancreas: Secondary | ICD-10-CM | POA: Diagnosis present

## 2017-12-11 LAB — COMPREHENSIVE METABOLIC PANEL
ALT: 57 U/L (ref 17–63)
AST: 103 U/L — ABNORMAL HIGH (ref 15–41)
Albumin: 4.2 g/dL (ref 3.5–5.0)
Alkaline Phosphatase: 122 U/L (ref 38–126)
Anion gap: 18 — ABNORMAL HIGH (ref 5–15)
BUN: <5 mg/dL — ABNORMAL LOW (ref 6–20)
CO2: 20 mmol/L — ABNORMAL LOW (ref 22–32)
Calcium: 9.5 mg/dL (ref 8.9–10.3)
Chloride: 93 mmol/L — ABNORMAL LOW (ref 101–111)
Creatinine, Ser: 0.8 mg/dL (ref 0.61–1.24)
GFR calc Af Amer: >60 mL/min (ref >60)
GFR calc non Af Amer: >60 mL/min (ref >60)
Glucose, Bld: 155 mg/dL — ABNORMAL HIGH (ref 65–99)
Potassium: 3 mmol/L — ABNORMAL LOW (ref 3.5–5.1)
Sodium: 131 mmol/L — ABNORMAL LOW (ref 135–145)
Total Bilirubin: 1 mg/dL (ref 0.3–1.2)
Total Protein: 8.9 g/dL — ABNORMAL HIGH (ref 6.5–8.1)

## 2017-12-11 LAB — URINALYSIS, ROUTINE W REFLEX MICROSCOPIC
Bacteria, UA: NONE SEEN
Bilirubin Urine: NEGATIVE
Glucose, UA: NEGATIVE mg/dL
Hgb urine dipstick: NEGATIVE
Ketones, ur: NEGATIVE mg/dL
Leukocytes, UA: NEGATIVE
Nitrite: NEGATIVE
Protein, ur: 30 mg/dL — AB
RBC / HPF: NONE SEEN RBC/hpf (ref 0–5)
Specific Gravity, Urine: 1.006 (ref 1.005–1.030)
Squamous Epithelial / LPF: NONE SEEN
pH: 7 (ref 5.0–8.0)

## 2017-12-11 LAB — CBC
HCT: 43.1 % (ref 39.0–52.0)
Hemoglobin: 14.9 g/dL (ref 13.0–17.0)
MCH: 30 pg (ref 26.0–34.0)
MCHC: 34.6 g/dL (ref 30.0–36.0)
MCV: 86.7 fL (ref 78.0–100.0)
Platelets: 283 10*3/uL (ref 150–400)
RBC: 4.97 MIL/uL (ref 4.22–5.81)
RDW: 14.3 % (ref 11.5–15.5)
WBC: 6.7 10*3/uL (ref 4.0–10.5)

## 2017-12-11 LAB — LIPASE, BLOOD: Lipase: 23 U/L (ref 11–51)

## 2017-12-11 MED ORDER — LORAZEPAM 2 MG/ML IJ SOLN
0.5000 mg | Freq: Once | INTRAMUSCULAR | Status: AC
  Administered 2017-12-11: 0.5 mg via INTRAVENOUS
  Filled 2017-12-11: qty 1

## 2017-12-11 MED ORDER — SODIUM CHLORIDE 0.9 % IV SOLN
80.0000 mg | Freq: Once | INTRAVENOUS | Status: AC
  Administered 2017-12-12: 80 mg via INTRAVENOUS
  Filled 2017-12-11: qty 80

## 2017-12-11 MED ORDER — ONDANSETRON HCL 4 MG/2ML IJ SOLN
4.0000 mg | Freq: Once | INTRAMUSCULAR | Status: AC
  Administered 2017-12-11: 4 mg via INTRAVENOUS
  Filled 2017-12-11: qty 2

## 2017-12-11 MED ORDER — POTASSIUM CHLORIDE 10 MEQ/100ML IV SOLN
10.0000 meq | Freq: Once | INTRAVENOUS | Status: AC
  Administered 2017-12-11: 10 meq via INTRAVENOUS
  Filled 2017-12-11: qty 100

## 2017-12-11 MED ORDER — SODIUM CHLORIDE 0.9 % IV BOLUS (SEPSIS)
1000.0000 mL | Freq: Once | INTRAVENOUS | Status: AC
  Administered 2017-12-11: 1000 mL via INTRAVENOUS

## 2017-12-11 NOTE — ED Triage Notes (Signed)
Pt presents to the ed with complaints of drinking a 12 pack of beer last night and waking up feeling sick all day. Reports that he drinks daily.  Reports only drinking 1 beer today and has thrown up 5 times today. Denies any pain.
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
## 2017-12-12 ENCOUNTER — Encounter (HOSPITAL_COMMUNITY): Payer: Self-pay | Admitting: Internal Medicine

## 2017-12-12 ENCOUNTER — Observation Stay (HOSPITAL_COMMUNITY): Payer: Medicare HMO

## 2017-12-12 DIAGNOSIS — R112 Nausea with vomiting, unspecified: Secondary | ICD-10-CM | POA: Diagnosis present

## 2017-12-12 DIAGNOSIS — F10230 Alcohol dependence with withdrawal, uncomplicated: Secondary | ICD-10-CM | POA: Diagnosis not present

## 2017-12-12 DIAGNOSIS — K2920 Alcoholic gastritis without bleeding: Secondary | ICD-10-CM | POA: Diagnosis not present

## 2017-12-12 DIAGNOSIS — I10 Essential (primary) hypertension: Secondary | ICD-10-CM | POA: Diagnosis not present

## 2017-12-12 DIAGNOSIS — E876 Hypokalemia: Secondary | ICD-10-CM | POA: Diagnosis not present

## 2017-12-12 LAB — BASIC METABOLIC PANEL
Anion gap: 15 (ref 5–15)
BUN: <5 mg/dL — ABNORMAL LOW (ref 6–20)
CO2: 22 mmol/L (ref 22–32)
Calcium: 8.8 mg/dL — ABNORMAL LOW (ref 8.9–10.3)
Chloride: 97 mmol/L — ABNORMAL LOW (ref 101–111)
Creatinine, Ser: 0.79 mg/dL (ref 0.61–1.24)
GFR calc Af Amer: >60 mL/min (ref >60)
GFR calc non Af Amer: >60 mL/min (ref >60)
Glucose, Bld: 122 mg/dL — ABNORMAL HIGH (ref 65–99)
Potassium: 3.1 mmol/L — ABNORMAL LOW (ref 3.5–5.1)
Sodium: 134 mmol/L — ABNORMAL LOW (ref 135–145)

## 2017-12-12 LAB — CBC
HCT: 37.4 % — ABNORMAL LOW (ref 39.0–52.0)
Hemoglobin: 13.3 g/dL (ref 13.0–17.0)
MCH: 30.9 pg (ref 26.0–34.0)
MCHC: 35.6 g/dL (ref 30.0–36.0)
MCV: 87 fL (ref 78.0–100.0)
Platelets: 195 10*3/uL (ref 150–400)
RBC: 4.3 MIL/uL (ref 4.22–5.81)
RDW: 14.4 % (ref 11.5–15.5)
WBC: 7.2 10*3/uL (ref 4.0–10.5)

## 2017-12-12 LAB — GLUCOSE, CAPILLARY: Glucose-Capillary: 98 mg/dL (ref 65–99)

## 2017-12-12 LAB — CBG MONITORING, ED: Glucose-Capillary: 105 mg/dL — ABNORMAL HIGH (ref 65–99)

## 2017-12-12 LAB — MRSA PCR SCREENING: MRSA by PCR: NEGATIVE

## 2017-12-12 LAB — HEPATIC FUNCTION PANEL
ALT: 46 U/L (ref 17–63)
AST: 78 U/L — ABNORMAL HIGH (ref 15–41)
Albumin: 3.5 g/dL (ref 3.5–5.0)
Alkaline Phosphatase: 99 U/L (ref 38–126)
Bilirubin, Direct: 0.3 mg/dL (ref 0.1–0.5)
Indirect Bilirubin: 0.9 mg/dL (ref 0.3–0.9)
Total Bilirubin: 1.2 mg/dL (ref 0.3–1.2)
Total Protein: 7.5 g/dL (ref 6.5–8.1)

## 2017-12-12 LAB — MAGNESIUM: Magnesium: 1.7 mg/dL (ref 1.7–2.4)

## 2017-12-12 LAB — HIV ANTIBODY (ROUTINE TESTING W REFLEX): HIV Screen 4th Generation wRfx: NONREACTIVE

## 2017-12-12 MED ORDER — LORAZEPAM 2 MG/ML IJ SOLN
2.0000 mg | INTRAMUSCULAR | Status: DC | PRN
  Administered 2017-12-13: 2 mg via INTRAVENOUS
  Filled 2017-12-12: qty 1

## 2017-12-12 MED ORDER — PANTOPRAZOLE SODIUM 40 MG IV SOLR
40.0000 mg | INTRAVENOUS | Status: DC
  Administered 2017-12-13: 40 mg via INTRAVENOUS
  Filled 2017-12-12: qty 40

## 2017-12-12 MED ORDER — ONDANSETRON HCL 4 MG/2ML IJ SOLN
4.0000 mg | Freq: Four times a day (QID) | INTRAMUSCULAR | Status: DC | PRN
  Filled 2017-12-12: qty 2

## 2017-12-12 MED ORDER — HYDRALAZINE HCL 20 MG/ML IJ SOLN: 10.0000 mg | INTRAMUSCULAR | Status: DC | PRN

## 2017-12-12 MED ORDER — POTASSIUM CHLORIDE 10 MEQ/100ML IV SOLN
10.0000 meq | INTRAVENOUS | Status: AC
  Administered 2017-12-12 (×2): 10 meq via INTRAVENOUS
  Filled 2017-12-12 (×2): qty 100

## 2017-12-12 MED ORDER — ACETAMINOPHEN 325 MG PO TABS: 650.0000 mg | ORAL_TABLET | Freq: Four times a day (QID) | ORAL | Status: DC | PRN

## 2017-12-12 MED ORDER — FOLIC ACID 5 MG/ML IJ SOLN
1.0000 mg | Freq: Every day | INTRAMUSCULAR | Status: DC
  Administered 2017-12-12 (×2): 1 mg via INTRAVENOUS
  Filled 2017-12-12 (×4): qty 0.2

## 2017-12-12 MED ORDER — THIAMINE HCL 100 MG/ML IJ SOLN
100.0000 mg | Freq: Every day | INTRAMUSCULAR | Status: DC
  Administered 2017-12-12 (×2): 100 mg via INTRAVENOUS
  Filled 2017-12-12 (×2): qty 2

## 2017-12-12 MED ORDER — ACETAMINOPHEN 650 MG RE SUPP: 650.0000 mg | Freq: Four times a day (QID) | RECTAL | Status: DC | PRN

## 2017-12-12 MED ORDER — POTASSIUM CHLORIDE IN NACL 20-0.9 MEQ/L-% IV SOLN
INTRAVENOUS | Status: AC
  Administered 2017-12-12 (×2): via INTRAVENOUS
  Filled 2017-12-12 (×3): qty 1000

## 2017-12-12 MED ORDER — ONDANSETRON HCL 4 MG PO TABS
4.0000 mg | ORAL_TABLET | Freq: Four times a day (QID) | ORAL | Status: DC | PRN
Start: 2017-12-12 — End: 2017-12-13
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
## 2017-12-12 NOTE — Progress Notes (Signed)
   Follow Up Note  HPI: Please see H&P earlier this am for full details  57 yr old male with a hx of alcohol abuse, HTN is admitted for nausea, vomiting and mild epigastric pain currently being managed for alcohol withdrawal. Lipase noted to be WNL. Hx of pancreatic mass and liver cirrhosis on CT in 2017, follow up with MRI abdomen was recommended. MRI ordered, pending.  Today, pt reported feeling slightly better. Resolving n/v and mild epigastric tenderness. Denies chest pain, SOB, fever/chills. Will advance to clear liquid  Exam: CV: S1, S2 present, no added hrt sound Lungs: Chest clear bilaterally Abd: Soft, mild epigastric tenderness, obese, BS present Ext: No pedal edema bilaterally  Present on Admission: . Nausea & vomiting . Hypokalemia . Hypertension . Pancreatic mass   Disposition: Monitor in SDU for possible worsening withdrawal symptoms Ensure MRI abdomen is done prior to discharge Advance diet, monitor closely

## 2017-12-12 NOTE — Care Management Obs Status (Signed)
[facility] NOTIFICATION   Patient Details  Name: Chad Turner MRN: [id number] Date of Birth: Feb 19, 1961   Medicare Observation Status Notification Given:  Yes    Carles Collet, RN 12/12/2017, 2:19 PM

## 2017-12-13 ENCOUNTER — Observation Stay (HOSPITAL_COMMUNITY): Payer: Medicare HMO

## 2017-12-13 DIAGNOSIS — I10 Essential (primary) hypertension: Secondary | ICD-10-CM

## 2017-12-13 DIAGNOSIS — E876 Hypokalemia: Secondary | ICD-10-CM

## 2017-12-13 DIAGNOSIS — F10230 Alcohol dependence with withdrawal, uncomplicated: Secondary | ICD-10-CM | POA: Diagnosis not present

## 2017-12-13 DIAGNOSIS — K2920 Alcoholic gastritis without bleeding: Secondary | ICD-10-CM | POA: Diagnosis not present

## 2017-12-13 DIAGNOSIS — K869 Disease of pancreas, unspecified: Secondary | ICD-10-CM | POA: Diagnosis not present

## 2017-12-13 DIAGNOSIS — R112 Nausea with vomiting, unspecified: Secondary | ICD-10-CM

## 2017-12-13 LAB — BASIC METABOLIC PANEL
Anion gap: 11 (ref 5–15)
BUN: <5 mg/dL — ABNORMAL LOW (ref 6–20)
CO2: 23 mmol/L (ref 22–32)
Calcium: 9.5 mg/dL (ref 8.9–10.3)
Chloride: 99 mmol/L — ABNORMAL LOW (ref 101–111)
Creatinine, Ser: 0.72 mg/dL (ref 0.61–1.24)
GFR calc Af Amer: >60 mL/min (ref >60)
GFR calc non Af Amer: >60 mL/min (ref >60)
Glucose, Bld: 106 mg/dL — ABNORMAL HIGH (ref 65–99)
Potassium: 3.3 mmol/L — ABNORMAL LOW (ref 3.5–5.1)
Sodium: 133 mmol/L — ABNORMAL LOW (ref 135–145)

## 2017-12-13 LAB — CBC WITH DIFFERENTIAL/PLATELET
Basophils Absolute: 0 10*3/uL (ref 0.0–0.1)
Basophils Relative: 0 %
Eosinophils Absolute: 0.1 10*3/uL (ref 0.0–0.7)
Eosinophils Relative: 2 %
HCT: 40.6 % (ref 39.0–52.0)
Hemoglobin: 13.9 g/dL (ref 13.0–17.0)
Lymphocytes Relative: 34 %
Lymphs Abs: 1.9 10*3/uL (ref 0.7–4.0)
MCH: 30.5 pg (ref 26.0–34.0)
MCHC: 34.2 g/dL (ref 30.0–36.0)
MCV: 89 fL (ref 78.0–100.0)
Monocytes Absolute: 0.9 10*3/uL (ref 0.1–1.0)
Monocytes Relative: 16 %
Neutro Abs: 2.6 10*3/uL (ref 1.7–7.7)
Neutrophils Relative %: 48 %
Platelets: 192 10*3/uL (ref 150–400)
RBC: 4.56 MIL/uL (ref 4.22–5.81)
RDW: 14.6 % (ref 11.5–15.5)
WBC: 5.6 10*3/uL (ref 4.0–10.5)

## 2017-12-13 LAB — GLUCOSE, CAPILLARY: Glucose-Capillary: 102 mg/dL — ABNORMAL HIGH (ref 65–99)

## 2017-12-13 MED ORDER — POTASSIUM CHLORIDE CRYS ER 20 MEQ PO TBCR
40.0000 meq | EXTENDED_RELEASE_TABLET | ORAL | Status: DC
  Administered 2017-12-13: 40 meq via ORAL
  Filled 2017-12-13: qty 2

## 2017-12-13 MED ORDER — GADOBENATE DIMEGLUMINE 529 MG/ML IV SOLN
20.0000 mL | Freq: Once | INTRAVENOUS | Status: AC
  Administered 2017-12-13: 20 mL via INTRAVENOUS

## 2017-12-13 MED ORDER — VITAMIN B-1 100 MG PO TABS
100.0000 mg | ORAL_TABLET | Freq: Every day | ORAL | Status: DC
  Administered 2017-12-13: 100 mg via ORAL
  Filled 2017-12-13: qty 1

## 2017-12-13 MED ORDER — PANTOPRAZOLE SODIUM 40 MG PO TBEC: 40.0000 mg | DELAYED_RELEASE_TABLET | Freq: Every day | ORAL | 0 refills | Status: DC

## 2017-12-13 MED ORDER — LORAZEPAM 1 MG PO TABS: 1.0000 mg | ORAL_TABLET | ORAL | Status: DC | PRN

## 2017-12-13 NOTE — Progress Notes (Signed)
Pt's IV removed; belongings collected; educated; given d/c instructions; will be wheeled out as soon as ride is ready. Will continue to monitor.   [place]  Christy Ehrsam, RN

## 2017-12-13 NOTE — Discharge Summary (Signed)
Physician Discharge Summary  Chad Turner XNA:[id number] DOB: [birth year] DOA: 12/11/2017  PCP: [facility]  Admit date: 12/11/2017 Discharge date: 12/13/2017  Admitted From: Home  Disposition:  Home  Recommendations for Outpatient Follow-up and new medication changes:  1. Follow up with PCP in 1- week 2. Patient has been placed on Pantoprazole. 3. Advised to stop drinking alcohol.  Home Health: no   Equipment/Devices: no   Discharge Condition: stable CODE STATUS: full  Diet recommendation: Heart healthy   Brief/Interim Summary: 57 year old male who presented with nausea and vomiting. He does have a significant past medical history for alcoholism and hypertension. Complained of 2 days of persistent epigastric abdominal pain, associated with nausea and vomiting. On his initial physical examination blood pressure 157/85, heart rate 107, respiratory rate 16, temperature 97.7, oxygen saturations 97%. He was awake and alert, moist mucous membranes, lungs clear to auscultation bilaterally, heart S1 S2 present rhythmic, abdomen soft nontender, no lower extremity edema. Sodium 131, potassium 3.0, chloride 93, bicarbonate 20, glucose 155, BUN less than 5, creatinine 0.80, lipase 23, AST 103, ALT 57, white count 6.7, hemoglobin 14.9, hematocrit 40.1, platelets 283. Urinalysis negative for infection, specific gravity 1.006, 30 of protein, alcohol level 70. Chest x-ray clear for infiltrates, abdominal family with nonspecific gas pattern.  Patient was admitted to the hospital working diagnosis of intractable nausea and vomiting, to rule out pancreatitis.   1.  Alcohol induced gastritis. Patient was admitted to medical stepdown unit, he was placed on IV fluids, IV antiacids and as needed IV antiemetics. He responded well to the medical therapy, his diet was advance with good toleration. Patient will be discharged on pantoprazole daily, he was advised to avoid alcohol.  2.  History of pancreatic cyst.  Patient had MRI of his abdomen, the pancreatic cystic lesion had resolution. Positive cirrhosis, hepatomegaly and hepatic steatosis. Pancreatitis was ruled out.   3. Alcohol intoxication. Patient was placed on alcohol withdrawal protocol, admitted to the stepdown unit, received IV fluids, multivitamins including thiamine. No significant withdrawal symptoms. Was advised to avoid alcohol.  4. Hypertension. She was placed on acid hydralazine intravenously, his blood pressure remained well-controlled, he will resume hydrochlorothiazide and amlodipine at discharge.  5. Hypokalemia. Likely related to alcohol intoxication realated renal losses, patient received potassium chloride orally for repletion. His kidney function remained preserved.     Discharge Diagnoses:  Principal Problem:   Nausea & vomiting Active Problems:   Alcohol withdrawal syndrome without complication (HCC)   Pancreatic mass   Hypertension   Hypokalemia    Discharge Instructions   Allergies as of 12/13/2017      Reactions   Penicillins Swelling   Facial swelling Has patient had a PCN reaction causing immediate rash, facial/tongue/throat swelling, SOB or lightheadedness with hypotension: Yes Has patient had a PCN reaction causing severe rash involving mucus membranes or skin necrosis: No Has patient had a PCN reaction that required hospitalization: in hospital at time of reaction Has patient had a PCN reaction occurring within the last 10 years: No If all of the above answers are "NO", then may proceed with Cephalosporin use.      Medication List    STOP taking these medications   omeprazole 20 MG capsule Commonly known as:  PRILOSEC   sucralfate 1 g tablet Commonly known as:  CARAFATE   traZODone 50 MG tablet Commonly known as:  DESYREL     TAKE these medications   amLODipine 10 MG tablet Commonly known as:  NORVASC Take 10 mg by mouth daily.   hydrochlorothiazide 12.5 MG  capsule Commonly known as:  MICROZIDE Take 12.5 mg by mouth daily.   pantoprazole 40 MG tablet Commonly known as:  PROTONIX Take 1 tablet (40 mg total) by mouth daily.   traMADol 50 MG tablet Commonly known as:  ULTRAM Take 50 mg by mouth daily as needed (pain).   VITAMIN C PO Take 1 tablet by mouth daily.       Allergies  Allergen Reactions  . Penicillins Swelling    Facial swelling Has patient had a PCN reaction causing immediate rash, facial/tongue/throat swelling, SOB or lightheadedness with hypotension: Yes Has patient had a PCN reaction causing severe rash involving mucus membranes or skin necrosis: No Has patient had a PCN reaction that required hospitalization: in hospital at time of reaction Has patient had a PCN reaction occurring within the last 10 years: No If all of the above answers are "NO", then may proceed with Cephalosporin use.    Consultations:     Procedures/Studies: Mr Abdomen W Wo Contrast  Result Date: 12/13/2017 CLINICAL DATA:  Pancreatic cystic lesion.  Nausea and vomiting. EXAM: MRI ABDOMEN WITHOUT AND WITH CONTRAST TECHNIQUE: Multiplanar multisequence MR imaging of the abdomen was performed both before and after the administration of intravenous contrast. CONTRAST:  9m MULTIHANCE GADOBENATE DIMEGLUMINE 529 MG/ML IV SOLN COMPARISON:  06/04/2016 FINDINGS: Mild to moderate motion degradation, most prevalent on the pre and postcontrast dynamic images. Lower chest: Normal heart size without pericardial or pleural effusion. Hepatobiliary: Hepatomegaly at 22.5 cm craniocaudal. Marked hepatic steatosis. Moderate cirrhosis. No focal liver lesion. Normal gallbladder, without biliary ductal dilatation. Pancreas: No acute pancreatitis. The previously described peripancreatic complex cystic lesion has resolved. No new peripancreatic fluid collections. Normal pancreatic enhancement. Spleen:  Normal in size, without focal abnormality. Adrenals/Urinary Tract: Normal  adrenal glands. Normal kidneys, without hydronephrosis. Stomach/Bowel: Normal stomach and abdominal bowel loops. Vascular/Lymphatic: Normal caliber of the aorta and branch vessels. Prominent porta hepatis nodes are not pathologic by size criteria and similar, including on images 22 and 29/series 6. Other:  No ascites. Musculoskeletal: No acute osseous abnormality. IMPRESSION: 1. Portions of exam are mild to moderately motion degraded. 2. Resolution of previously described peripancreatic cystic lesion. 3. Cirrhosis, hepatomegaly, and hepatic steatosis. Electronically Signed   By: KAbigail MiyamotoM.D.   On: 12/13/2017 09:58   Dg Abd Acute W/chest  Result Date: 12/12/2017 CLINICAL DATA:  Acute onset of vomiting. EXAM: DG ABDOMEN ACUTE W/ 1V CHEST COMPARISON:  CT of the abdomen and pelvis performed 06/03/2016, and chest radiograph performed 06/02/2016 FINDINGS: The lungs are well-aerated and clear. There is no evidence of focal opacification, pleural effusion or pneumothorax. The cardiomediastinal silhouette is within normal limits. The visualized bowel gas pattern is unremarkable. Scattered air is seen within the colon; there is no evidence of small bowel dilatation to suggest obstruction. No free intra-abdominal air is identified on the provided upright view. No acute osseous abnormalities are seen; the sacroiliac joints are unremarkable in appearance. Mild sclerosis is noted at the acetabula bilaterally. IMPRESSION: 1. Unremarkable bowel gas pattern; no free intra-abdominal air seen. 2. No acute cardiopulmonary process seen. Electronically Signed   By: JGarald BaldingM.D.   On: 12/12/2017 01:10       Subjective: Patient is feeling better, no nausea or vomiting, no dyspnea or chest pain. Tolerating po well.   Discharge Exam: Vitals:   12/13/17 0814 12/13/17 1138  BP: 120/82   Pulse: 90 (P) 91  Resp: (!) 25   Temp: 98.6 F (37 C) (P) 98.9 F (37.2 C)  SpO2: 92%    Vitals:   12/12/17 2317 12/13/17  0410 12/13/17 0814 12/13/17 1138  BP: (!) 147/90 121/83 120/82   Pulse: 94 91 90 (P) 91  Resp: (!) 21 (!) 23 (!) 25   Temp: 99 F (37.2 C) 98.8 F (37.1 C) 98.6 F (37 C) (P) 98.9 F (37.2 C)  TempSrc: Oral Oral Oral (P) Oral  SpO2: 97% 99% 92%   Weight:      Height:        General: Not in pain or dyspnea.  Neurology: Awake and alert, non focal  E ENT: pallor, no icterus, oral mucosa moist Cardiovascular: No JVD. S1-S2 present, rhythmic, no gallops, rubs, or murmurs. No lower extremity edema. Pulmonary: vesicular breath sounds bilaterally, adequate air movement, no wheezing, rhonchi or rales. Gastrointestinal. Abdomen protuberant, no organomegaly, non tender, no rebound or guarding Skin. No rashes Musculoskeletal: no joint deformities   The results of significant diagnostics from this hospitalization (including imaging, microbiology, ancillary and laboratory) are listed below for reference.     Microbiology: Recent Results (from the past 240 hour(s))  MRSA PCR Screening     Status: None   Collection Time: 12/12/17  7:17 AM  Result Value Ref Range Status   MRSA by PCR NEGATIVE NEGATIVE Final    Comment:        The GeneXpert MRSA Assay (FDA approved for NASAL specimens only), is one component of a comprehensive MRSA colonization surveillance program. It is not intended to diagnose MRSA infection nor to guide or monitor treatment for MRSA infections. Performed at [facility], [place] [address], [place], [place] [address]      Labs: BNP (last 3 results) No results for input(s): BNP in the last 8760 hours. Basic Metabolic Panel: Recent Labs  Lab 12/11/17 1755 12/12/17 0027 12/12/17 0237 12/13/17 0233  NA 131*  --  134* 133*  K 3.0*  --  3.1* 3.3*  CL 93*  --  97* 99*  CO2 20*  --  22 23  GLUCOSE 155*  --  122* 106*  BUN <5*  --  <5* <5*  CREATININE 0.80  --  0.79 0.72  CALCIUM 9.5  --  8.8* 9.5  MG  --  1.7  --   --    Liver Function Tests: Recent  Labs  Lab 12/11/17 1755 12/12/17 0237  AST 103* 78*  ALT 57 46  ALKPHOS 122 99  BILITOT 1.0 1.2  PROT 8.9* 7.5  ALBUMIN 4.2 3.5   Recent Labs  Lab 12/11/17 1755  LIPASE 23   No results for input(s): AMMONIA in the last 168 hours. CBC: Recent Labs  Lab 12/11/17 1755 12/12/17 0237 12/13/17 0233  WBC 6.7 7.2 5.6  NEUTROABS  --   --  2.6  HGB 14.9 13.3 13.9  HCT 43.1 37.4* 40.6  MCV 86.7 87.0 89.0  PLT 283 195 192   Cardiac Enzymes: No results for input(s): CKTOTAL, CKMB, CKMBINDEX, TROPONINI in the last 168 hours. BNP: Invalid input(s): POCBNP CBG: Recent Labs  Lab 12/12/17 0029 12/12/17 0806 12/13/17 0817  GLUCAP 105* 98 102*   D-Dimer No results for input(s): DDIMER in the last 72 hours. Hgb A1c No results for input(s): HGBA1C in the last 72 hours. Lipid Profile No results for input(s): CHOL, HDL, LDLCALC, TRIG, CHOLHDL, LDLDIRECT in the last 72 hours. Thyroid function studies No results for input(s):  TSH, T4TOTAL, T3FREE, THYROIDAB in the last 72 hours.  Invalid input(s): FREET3 Anemia work up No results for input(s): VITAMINB12, FOLATE, FERRITIN, TIBC, IRON, RETICCTPCT in the last 72 hours. Urinalysis    Component Value Date/Time   COLORURINE YELLOW 12/11/2017 1822   APPEARANCEUR CLEAR 12/11/2017 1822   LABSPEC 1.006 12/11/2017 1822   PHURINE 7.0 12/11/2017 1822   GLUCOSEU NEGATIVE 12/11/2017 1822   HGBUR NEGATIVE 12/11/2017 1822   BILIRUBINUR NEGATIVE 12/11/2017 1822   KETONESUR NEGATIVE 12/11/2017 1822   PROTEINUR 30 (A) 12/11/2017 1822   UROBILINOGEN 1.0 09/18/2015 1459   NITRITE NEGATIVE 12/11/2017 1822   LEUKOCYTESUR NEGATIVE 12/11/2017 1822   Sepsis Labs Invalid input(s): PROCALCITONIN,  WBC,  LACTICIDVEN Microbiology Recent Results (from the past 240 hour(s))  MRSA PCR Screening     Status: None   Collection Time: 12/12/17  7:17 AM  Result Value Ref Range Status   MRSA by PCR NEGATIVE NEGATIVE Final    Comment:        The  GeneXpert MRSA Assay (FDA approved for NASAL specimens only), is one component of a comprehensive MRSA colonization surveillance program. It is not intended to diagnose MRSA infection nor to guide or monitor treatment for MRSA infections. Performed at [facility], [place] [address], [place], [place] [address]      Time coordinating discharge: 45 minutes  SIGNED:   Tawni Millers, MD  [facility] 12/13/2017, 1:45 PM Pager [phone]  If 7PM-7AM, please contact night-coverage [email or web address] Password [email or web address]

## 2017-12-13 NOTE — Discharge Instructions (Signed)
Alcohol Withdrawal When a person who drinks a lot of alcohol stops drinking, he or she may go through alcohol withdrawal. Alcohol withdrawal causes problems. It can make you feel:  Tired (fatigued).  Sad (depressed).  Fearful (anxious).  Grouchy (irritable).  Not hungry.  Sick to your stomach (nauseous).  Shaky.  It can also make you have:  Nightmares.  Trouble sleeping.  Trouble thinking clearly.  Mood swings.  Clammy skin.  Very bad sweating.  A very fast heartbeat.  Shaking that you cannot control (tremor).  Having a fever.  A fit of movements that you cannot control (seizure).  Confusion.  Throwing up (vomiting).  Feeling or seeing things that are not there (hallucinations).  Follow these instructions at home:  Take medicines and vitamins only as told by your doctor.  Do not drink alcohol.  Have someone around in case you need help.  Drink enough fluid to keep your pee (urine) clear or pale yellow.  Think about joining a group to help you stop drinking. Contact a doctor if:  Your problems get worse.  Your problems do not go away.  You cannot eat or drink without throwing up.  You are having a hard time not drinking alcohol.  You cannot stop drinking alcohol. Get help right away if:  You feel your heart beating differently than usual.  Your chest hurts.  You have trouble breathing.  You have very bad problems, like: ? A fever. ? A fit of movements that you cannot control. ? Being very confused. ? Feeling or seeing things that are not there. This information is not intended to replace advice given to you by your health care provider. Make sure you discuss any questions you have with your health care provider. Document Released: 04/04/2008 Document Revised: 03/24/2016 Document Reviewed: 08/05/2014 Elsevier Interactive Patient Education  2018 [employer].   Alcohol Use Disorder Alcohol use disorder is when your drinking disrupts  your daily life. When you have this condition, you drink too much alcohol and you cannot control your drinking. Alcohol use disorder can cause serious problems with your physical health. It can affect your brain, heart, liver, pancreas, immune system, stomach, and intestines. Alcohol use disorder can increase your risk for certain cancers and cause problems with your mental health, such as depression, anxiety, psychosis, delirium, and dementia. People with this disorder risk hurting themselves and others. What are the causes? This condition is caused by drinking too much alcohol over time. It is not caused by drinking too much alcohol only one or two times. Some people with this condition drink alcohol to cope with or escape from negative life events. Others drink to relieve pain or symptoms of mental illness. What increases the risk? You are more likely to develop this condition if:  You have a family history of alcohol use disorder.  Your culture encourages drinking to the point of intoxication, or makes alcohol easy to get.  You had a mood or conduct disorder in childhood.  You have been a victim of abuse.  You are an adolescent and: ? You have poor grades or difficulties in school. ? Your caregivers do not talk to you about saying no to alcohol, or supervise your activities. ? You are impulsive or you have trouble with self-control.  What are the signs or symptoms? Symptoms of this condition include:  Drinkingmore than you want to.  Drinking for longer than you want to.  Trying several times to drink less or to control your  drinking.  Spending a lot of time getting alcohol, drinking, or recovering from drinking.  Craving alcohol.  Having problems at work, at school, or at home due to drinking.  Having problems in relationships due to drinking.  Drinking when it is dangerous to drink, such as before driving a car.  Continuing to drink even though you know you might have a  physical or mental problem related to drinking.  Needing more and more alcohol to get the same effect you want from the alcohol (building up tolerance).  Having symptoms of withdrawal when you stop drinking. Symptoms of withdrawal include: ? Fatigue. ? Nightmares. ? Trouble sleeping. ? Depression. ? Anxiety. ? Fever. ? Seizures. ? Severe confusion. ? Feeling or seeing things that are not there (hallucinations). ? Tremors. ? Rapid heart rate. ? Rapid breathing. ? High blood pressure.  Drinking to avoid symptoms of withdrawal.  How is this diagnosed? This condition is diagnosed with an assessment. Your health care provider may start the assessment by asking three or four questions about your drinking. Your health care provider may perform a physical exam or do lab tests to see if you have physical problems resulting from alcohol use. She or he may refer you to a mental health professional for evaluation. How is this treated? Some people with alcohol use disorder are able to reduce their alcohol use to low-risk levels. Others need to completely quit drinking alcohol. When necessary, mental health professionals with specialized training in substance use treatment can help. Your health care provider can help you decide how severe your alcohol use disorder is and what type of treatment you need. The following forms of treatment are available:  Detoxification. Detoxification involves quitting drinking and using prescription medicines within the first week to help lessen withdrawal symptoms. This treatment is important for people who have had withdrawal symptoms before and for heavy drinkers who are likely to have withdrawal symptoms. Alcohol withdrawal can be dangerous, and in severe cases, it can cause death. Detoxification may be provided in a home, community, or primary care setting, or in a hospital or substance use treatment facility.  Counseling. This treatment is also called talk  therapy. It is provided by substance use treatment counselors. A counselor can address the reasons you use alcohol and suggest ways to keep you from drinking again or to prevent problem drinking. The goals of talk therapy are to: ? Find healthy activities and ways for you to cope with stress. ? Identify and avoid the things that trigger your alcohol use. ? Help you learn how to handle cravings.  Medicines.Medicines can help treat alcohol use disorder by: ? Decreasing alcohol cravings. ? Decreasing the positive feeling you have when you drink alcohol. ? Causing an uncomfortable physical reaction when you drink alcohol (aversion therapy).  Support groups. Support groups are led by people who have quit drinking. They provide emotional support, advice, and guidance.  These forms of treatment are often combined. Some people with this condition benefit from a combination of treatments provided by specialized substance use treatment centers. Follow these instructions at home:  Take over-the-counter and prescription medicines only as told by your health care provider.  Check with your health care provider before starting any new medicines.  Ask friends and family members not to offer you alcohol.  Avoid situations where alcohol is served, including gatherings where others are drinking alcohol.  Create a plan for what to do when you are tempted to use alcohol.  Find hobbies or  activities that you enjoy that do not include alcohol.  Keep all follow-up visits as told by your health care provider. This is important. How is this prevented?  If you drink, limit alcohol intake to no more than 1 drink a day for nonpregnant women and 2 drinks a day for men. One drink equals 12 oz of beer, 5 oz of wine, or 1 oz of hard liquor.  If you have a mental health condition, get treatment and support.  Do not give alcohol to adolescents.  If you are an adolescent: ? Do not drink alcohol. ? Do not be  afraid to say no if someone offers you alcohol. Speak up about why you do not want to drink. You can be a positive role model for your friends and set a good example for those around you by not drinking alcohol. ? If your friends drink, spend time with others who do not drink alcohol. Make new friends who do not use alcohol. ? Find healthy ways to manage stress and emotions, such as meditation or deep breathing, exercise, spending time in nature, listening to music, or talking with a trusted friend or family member. Contact a health care provider if:  You are not able to take your medicines as told.  Your symptoms get worse.  You return to drinking alcohol (relapse) and your symptoms get worse. Get help right away if:  You have thoughts about hurting yourself or others. If you ever feel like you may hurt yourself or others, or have thoughts about taking your own life, get help right away. You can go to your nearest emergency department or call:  Your local emergency services (911 in the [place]  A suicide crisis helpline, such as the [place] at 509-468-9017. This is open 24 hours a day.  Summary  Alcohol use disorder is when your drinking disrupts your daily life. When you have this condition, you drink too much alcohol and you cannot control your drinking.  Treatment may include detoxification, counseling, medicine, and support groups.  Ask friends and family members not to offer you alcohol. Avoid situations where alcohol is served.  Get help right away if you have thoughts about hurting yourself or others. This information is not intended to replace advice given to you by your health care provider. Make sure you discuss any questions you have with your health care provider. Document Released: 11/24/2004 Document Revised: 07/14/2016 Document Reviewed: 07/14/2016 Elsevier Interactive Patient Education  [employer].

## 2018-02-20 ENCOUNTER — Emergency Department (HOSPITAL_COMMUNITY)
Admission: EM | Admit: 2018-02-20 | Discharge: 2018-02-20 | Disposition: A | Discharge status: Home / Self Care | Payer: Medicare HMO | Attending: Emergency Medicine | Admitting: Emergency Medicine

## 2018-02-20 ENCOUNTER — Encounter (HOSPITAL_COMMUNITY): Payer: Self-pay

## 2018-02-20 ENCOUNTER — Other Ambulatory Visit: Payer: Self-pay

## 2018-02-20 DIAGNOSIS — R1110 Vomiting, unspecified: Secondary | ICD-10-CM | POA: Diagnosis present

## 2018-02-20 DIAGNOSIS — Z96652 Presence of left artificial knee joint: Secondary | ICD-10-CM | POA: Diagnosis not present

## 2018-02-20 DIAGNOSIS — Z7902 Long term (current) use of antithrombotics/antiplatelets: Secondary | ICD-10-CM | POA: Diagnosis not present

## 2018-02-20 DIAGNOSIS — J45909 Unspecified asthma, uncomplicated: Secondary | ICD-10-CM | POA: Insufficient documentation

## 2018-02-20 DIAGNOSIS — R1115 Cyclical vomiting syndrome unrelated to migraine: Secondary | ICD-10-CM

## 2018-02-20 DIAGNOSIS — F1010 Alcohol abuse, uncomplicated: Secondary | ICD-10-CM | POA: Diagnosis not present

## 2018-02-20 DIAGNOSIS — Z79899 Other long term (current) drug therapy: Secondary | ICD-10-CM | POA: Diagnosis not present

## 2018-02-20 DIAGNOSIS — G43A0 Cyclical vomiting, not intractable: Secondary | ICD-10-CM | POA: Diagnosis not present

## 2018-02-20 DIAGNOSIS — I10 Essential (primary) hypertension: Secondary | ICD-10-CM | POA: Insufficient documentation

## 2018-02-20 MED ORDER — METOCLOPRAMIDE HCL 10 MG PO TABS: 10.0000 mg | ORAL_TABLET | Freq: Four times a day (QID) | ORAL | 0 refills | Status: DC

## 2018-02-20 MED ORDER — ONDANSETRON 4 MG PO TBDP
4.0000 mg | ORAL_TABLET | Freq: Once | ORAL | Status: AC | PRN
  Administered 2018-02-20: 4 mg via ORAL
  Filled 2018-02-20: qty 1

## 2018-02-20 MED ORDER — METOCLOPRAMIDE HCL 5 MG/ML IJ SOLN
10.0000 mg | Freq: Once | INTRAMUSCULAR | Status: AC
  Administered 2018-02-20: 10 mg via INTRAMUSCULAR
  Filled 2018-02-20: qty 2

## 2018-02-20 NOTE — ED Notes (Signed)
Pt in restroom at this time.
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
## 2018-02-20 NOTE — ED Triage Notes (Addendum)
Patient reports that he "has been vomiting all night long." patient was observed putting his fingers down his throat to vomit. Patient smells of alcohol and states he drank last night. Patient denies abdominal pain, but states his stomach is sore from vomiting. Patient reports diarrhea that started this AM.

## 2018-02-20 NOTE — ED Notes (Signed)
Pt given ginger ale.

## 2018-03-13 ENCOUNTER — Encounter (HOSPITAL_COMMUNITY): Payer: Self-pay | Admitting: Emergency Medicine

## 2018-03-13 ENCOUNTER — Emergency Department (HOSPITAL_COMMUNITY)
Admission: EM | Admit: 2018-03-13 | Discharge: 2018-03-14 | Disposition: A | Discharge status: Home / Self Care | Payer: Medicare HMO | Attending: Emergency Medicine | Admitting: Emergency Medicine

## 2018-03-13 DIAGNOSIS — Z96652 Presence of left artificial knee joint: Secondary | ICD-10-CM | POA: Diagnosis not present

## 2018-03-13 DIAGNOSIS — I10 Essential (primary) hypertension: Secondary | ICD-10-CM | POA: Diagnosis not present

## 2018-03-13 DIAGNOSIS — R1084 Generalized abdominal pain: Secondary | ICD-10-CM | POA: Diagnosis present

## 2018-03-13 DIAGNOSIS — J45909 Unspecified asthma, uncomplicated: Secondary | ICD-10-CM | POA: Diagnosis not present

## 2018-03-13 DIAGNOSIS — Z79899 Other long term (current) drug therapy: Secondary | ICD-10-CM | POA: Insufficient documentation

## 2018-03-13 LAB — URINALYSIS, ROUTINE W REFLEX MICROSCOPIC
Bilirubin Urine: NEGATIVE
Glucose, UA: NEGATIVE mg/dL
Hgb urine dipstick: NEGATIVE
Ketones, ur: NEGATIVE mg/dL
Leukocytes, UA: NEGATIVE
Nitrite: NEGATIVE
Protein, ur: NEGATIVE mg/dL
Specific Gravity, Urine: 1.008 (ref 1.005–1.030)
pH: 6 (ref 5.0–8.0)

## 2018-03-13 LAB — COMPREHENSIVE METABOLIC PANEL
ALT: 32 U/L (ref 17–63)
AST: 69 U/L — ABNORMAL HIGH (ref 15–41)
Albumin: 4.2 g/dL (ref 3.5–5.0)
Alkaline Phosphatase: 144 U/L — ABNORMAL HIGH (ref 38–126)
Anion gap: 19 — ABNORMAL HIGH (ref 5–15)
BUN: <5 mg/dL — ABNORMAL LOW (ref 6–20)
CO2: 20 mmol/L — ABNORMAL LOW (ref 22–32)
Calcium: 9.6 mg/dL (ref 8.9–10.3)
Chloride: 96 mmol/L — ABNORMAL LOW (ref 101–111)
Creatinine, Ser: 0.76 mg/dL (ref 0.61–1.24)
GFR calc Af Amer: >60 mL/min (ref >60)
GFR calc non Af Amer: >60 mL/min (ref >60)
Glucose, Bld: 114 mg/dL — ABNORMAL HIGH (ref 65–99)
Potassium: 3 mmol/L — ABNORMAL LOW (ref 3.5–5.1)
Sodium: 135 mmol/L (ref 135–145)
Total Bilirubin: 0.7 mg/dL (ref 0.3–1.2)
Total Protein: 9.3 g/dL — ABNORMAL HIGH (ref 6.5–8.1)

## 2018-03-13 LAB — CBC
HCT: 41.7 % (ref 39.0–52.0)
Hemoglobin: 14.5 g/dL (ref 13.0–17.0)
MCH: 29.8 pg (ref 26.0–34.0)
MCHC: 34.8 g/dL (ref 30.0–36.0)
MCV: 85.8 fL (ref 78.0–100.0)
Platelets: 331 10*3/uL (ref 150–400)
RBC: 4.86 MIL/uL (ref 4.22–5.81)
RDW: 13.5 % (ref 11.5–15.5)
WBC: 7.8 10*3/uL (ref 4.0–10.5)

## 2018-03-13 LAB — LIPASE, BLOOD: Lipase: 24 U/L (ref 11–51)

## 2018-03-13 MED ORDER — SODIUM CHLORIDE 0.9 % IV BOLUS
1000.0000 mL | Freq: Once | INTRAVENOUS | Status: AC
  Administered 2018-03-13: 1000 mL via INTRAVENOUS

## 2018-03-13 MED ORDER — METOCLOPRAMIDE HCL 5 MG/ML IJ SOLN
10.0000 mg | Freq: Once | INTRAMUSCULAR | Status: AC
  Administered 2018-03-13: 10 mg via INTRAVENOUS
  Filled 2018-03-13: qty 2

## 2018-03-13 MED ORDER — KETOROLAC TROMETHAMINE 30 MG/ML IJ SOLN
30.0000 mg | Freq: Once | INTRAMUSCULAR | Status: AC
  Administered 2018-03-13: 30 mg via INTRAVENOUS
  Filled 2018-03-13: qty 1

## 2018-03-13 NOTE — ED Triage Notes (Addendum)
Patient c/o generalized abdominal pain with N/V today. Denies diarrhea. Hx pancreatic mass. States last drink was beer "earlier today."
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
## 2018-03-14 DIAGNOSIS — R1084 Generalized abdominal pain: Secondary | ICD-10-CM | POA: Diagnosis not present

## 2018-03-14 MED ORDER — POTASSIUM CHLORIDE CRYS ER 20 MEQ PO TBCR
40.0000 meq | EXTENDED_RELEASE_TABLET | Freq: Once | ORAL | Status: AC
  Administered 2018-03-14: 40 meq via ORAL
  Filled 2018-03-14: qty 2

## 2018-03-14 MED ORDER — ONDANSETRON 4 MG PO TBDP: 4.0000 mg | ORAL_TABLET | Freq: Three times a day (TID) | ORAL | 0 refills | Status: DC | PRN

## 2018-03-14 MED ORDER — DICYCLOMINE HCL 10 MG PO CAPS: 20.0000 mg | ORAL_CAPSULE | Freq: Once | ORAL | Status: DC

## 2018-03-14 MED ORDER — DICYCLOMINE HCL 20 MG PO TABS: 20.0000 mg | ORAL_TABLET | Freq: Two times a day (BID) | ORAL | 0 refills | Status: DC

## 2018-03-14 NOTE — Discharge Instructions (Signed)
Labs today looked good-- your potassium was a little low, we gave you supplementation for this. Take the prescribed medication as directed.  Avoid alcohol as this is likely worsening your symptoms. Follow-up with your primary care doctor. Return to the ED for new or worsening symptoms.

## 2018-04-20 ENCOUNTER — Encounter (HOSPITAL_COMMUNITY): Payer: Self-pay | Admitting: *Deleted

## 2018-04-20 ENCOUNTER — Other Ambulatory Visit: Payer: Self-pay

## 2018-04-20 ENCOUNTER — Ambulatory Visit (HOSPITAL_COMMUNITY)
Admission: EM | Admit: 2018-04-20 | Discharge: 2018-04-20 | Disposition: A | Discharge status: Home / Self Care | Payer: Medicare HMO | Attending: Family Medicine | Admitting: Family Medicine

## 2018-04-20 DIAGNOSIS — S0096XA Insect bite (nonvenomous) of unspecified part of head, initial encounter: Secondary | ICD-10-CM

## 2018-04-20 DIAGNOSIS — W57XXXA Bitten or stung by nonvenomous insect and other nonvenomous arthropods, initial encounter: Secondary | ICD-10-CM

## 2018-04-20 DIAGNOSIS — S0086XA Insect bite (nonvenomous) of other part of head, initial encounter: Secondary | ICD-10-CM

## 2018-04-20 MED ORDER — CETIRIZINE HCL 10 MG PO TABS: 10.0000 mg | ORAL_TABLET | Freq: Every day | ORAL | 0 refills | Status: DC

## 2018-04-20 NOTE — Discharge Instructions (Signed)
Start zyrtec as directed. Ice compress to the affected area. Monitor for spreading redness, increased warmth, fever, follow up for reevaluation needed.

## 2018-04-20 NOTE — ED Triage Notes (Signed)
Reports feeling bite to right cheek yesterday.  C/O soreness and swelling continuing.  Denies any throat or oral sxs.
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
## 2018-05-14 ENCOUNTER — Other Ambulatory Visit: Payer: Self-pay

## 2018-05-14 ENCOUNTER — Emergency Department (HOSPITAL_COMMUNITY)
Admission: EM | Admit: 2018-05-14 | Discharge: 2018-05-14 | Disposition: A | Discharge status: Home / Self Care | Payer: Medicare HMO | Attending: Emergency Medicine | Admitting: Emergency Medicine

## 2018-05-14 ENCOUNTER — Encounter (HOSPITAL_COMMUNITY): Payer: Self-pay | Admitting: Emergency Medicine

## 2018-05-14 DIAGNOSIS — Z79899 Other long term (current) drug therapy: Secondary | ICD-10-CM | POA: Insufficient documentation

## 2018-05-14 DIAGNOSIS — J45909 Unspecified asthma, uncomplicated: Secondary | ICD-10-CM | POA: Insufficient documentation

## 2018-05-14 DIAGNOSIS — Z96652 Presence of left artificial knee joint: Secondary | ICD-10-CM | POA: Diagnosis not present

## 2018-05-14 DIAGNOSIS — R1013 Epigastric pain: Secondary | ICD-10-CM | POA: Insufficient documentation

## 2018-05-14 DIAGNOSIS — F1210 Cannabis abuse, uncomplicated: Secondary | ICD-10-CM | POA: Insufficient documentation

## 2018-05-14 DIAGNOSIS — R109 Unspecified abdominal pain: Secondary | ICD-10-CM | POA: Insufficient documentation

## 2018-05-14 DIAGNOSIS — R112 Nausea with vomiting, unspecified: Secondary | ICD-10-CM | POA: Diagnosis present

## 2018-05-14 DIAGNOSIS — I10 Essential (primary) hypertension: Secondary | ICD-10-CM | POA: Diagnosis not present

## 2018-05-14 DIAGNOSIS — R197 Diarrhea, unspecified: Secondary | ICD-10-CM | POA: Diagnosis not present

## 2018-05-14 LAB — URINALYSIS, ROUTINE W REFLEX MICROSCOPIC
Bilirubin Urine: NEGATIVE
Glucose, UA: NEGATIVE mg/dL
Hgb urine dipstick: NEGATIVE
Ketones, ur: NEGATIVE mg/dL
Leukocytes, UA: NEGATIVE
Nitrite: NEGATIVE
Protein, ur: NEGATIVE mg/dL
Specific Gravity, Urine: 1.005 (ref 1.005–1.030)
pH: 7 (ref 5.0–8.0)

## 2018-05-14 LAB — CBC
HCT: 41.9 % (ref 39.0–52.0)
Hemoglobin: 13.7 g/dL (ref 13.0–17.0)
MCH: 28.1 pg (ref 26.0–34.0)
MCHC: 32.7 g/dL (ref 30.0–36.0)
MCV: 86 fL (ref 78.0–100.0)
Platelets: 191 10*3/uL (ref 150–400)
RBC: 4.87 MIL/uL (ref 4.22–5.81)
RDW: 12.7 % (ref 11.5–15.5)
WBC: 5.3 10*3/uL (ref 4.0–10.5)

## 2018-05-14 LAB — COMPREHENSIVE METABOLIC PANEL
ALT: 26 U/L (ref 0–44)
AST: 65 U/L — ABNORMAL HIGH (ref 15–41)
Albumin: 4 g/dL (ref 3.5–5.0)
Alkaline Phosphatase: 106 U/L (ref 38–126)
Anion gap: 12 (ref 5–15)
BUN: <5 mg/dL — ABNORMAL LOW (ref 6–20)
CO2: 26 mmol/L (ref 22–32)
Calcium: 9.3 mg/dL (ref 8.9–10.3)
Chloride: 94 mmol/L — ABNORMAL LOW (ref 98–111)
Creatinine, Ser: 0.82 mg/dL (ref 0.61–1.24)
GFR calc Af Amer: >60 mL/min (ref >60)
GFR calc non Af Amer: >60 mL/min (ref >60)
Glucose, Bld: 135 mg/dL — ABNORMAL HIGH (ref 70–99)
Potassium: 3.2 mmol/L — ABNORMAL LOW (ref 3.5–5.1)
Sodium: 132 mmol/L — ABNORMAL LOW (ref 135–145)
Total Bilirubin: 2 mg/dL — ABNORMAL HIGH (ref 0.3–1.2)
Total Protein: 8.5 g/dL — ABNORMAL HIGH (ref 6.5–8.1)

## 2018-05-14 LAB — LIPASE, BLOOD: Lipase: 33 U/L (ref 11–51)

## 2018-05-14 MED ORDER — GI COCKTAIL ~~LOC~~
30.0000 mL | Freq: Once | ORAL | Status: AC
  Administered 2018-05-14: 30 mL via ORAL
  Filled 2018-05-14: qty 30

## 2018-05-14 MED ORDER — SODIUM CHLORIDE 0.9 % IV BOLUS
1000.0000 mL | Freq: Once | INTRAVENOUS | Status: AC
  Administered 2018-05-14: 1000 mL via INTRAVENOUS

## 2018-05-14 MED ORDER — ONDANSETRON HCL 4 MG/2ML IJ SOLN
4.0000 mg | Freq: Once | INTRAMUSCULAR | Status: AC
  Administered 2018-05-14: 4 mg via INTRAVENOUS
  Filled 2018-05-14: qty 2

## 2018-05-14 MED ORDER — OMEPRAZOLE 20 MG PO CPDR: 20.0000 mg | DELAYED_RELEASE_CAPSULE | Freq: Every day | ORAL | 0 refills | Status: DC

## 2018-05-14 MED ORDER — FAMOTIDINE 20 MG PO TABS: 20.0000 mg | ORAL_TABLET | Freq: Two times a day (BID) | ORAL | 0 refills | Status: DC

## 2018-05-14 MED ORDER — ONDANSETRON 8 MG PO TBDP: 8.0000 mg | ORAL_TABLET | Freq: Three times a day (TID) | ORAL | 0 refills | Status: DC | PRN

## 2018-05-14 MED ORDER — POTASSIUM CHLORIDE CRYS ER 20 MEQ PO TBCR
40.0000 meq | EXTENDED_RELEASE_TABLET | Freq: Once | ORAL | Status: AC
  Administered 2018-05-14: 40 meq via ORAL
  Filled 2018-05-14: qty 2
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
## 2018-05-14 NOTE — ED Triage Notes (Addendum)
Pt states generalized abdominal pain with emesis, some diarrhea X 2 days. No chest pain or shortness of breath. Does endorse weakness. Afebrile. Pt drinks alcohol daily, last drink was yesterday evening.

## 2018-05-14 NOTE — Discharge Instructions (Addendum)
Take Pepcid and Prilosec as prescribed daily.  Avoid alcohol use.  Avoid NSAID medications.  Do not smoke.  Zofran as prescribed as needed for nausea and vomiting.  Follow-up with family doctor.

## 2018-05-14 NOTE — ED Notes (Signed)
ED Provider at bedside. 

## 2018-05-22 ENCOUNTER — Encounter (HOSPITAL_COMMUNITY): Payer: Self-pay

## 2018-05-22 ENCOUNTER — Ambulatory Visit (HOSPITAL_COMMUNITY)
Admission: EM | Admit: 2018-05-22 | Discharge: 2018-05-22 | Disposition: A | Discharge status: Home / Self Care | Payer: Medicare HMO

## 2018-05-22 DIAGNOSIS — L729 Follicular cyst of the skin and subcutaneous tissue, unspecified: Secondary | ICD-10-CM | POA: Diagnosis not present
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
## 2018-05-22 NOTE — ED Triage Notes (Signed)
Pt presents with an insect bite on right side of face

## 2018-05-22 NOTE — Discharge Instructions (Addendum)
It was nice meeting you!!  Use warm compresses to try to express any drainage. Do this a couple of times a day.  You can put some antibiotic ointment on it to prevent infection.  It should drain on its own.  If not you will need to follow up with your PCP or dermatology to have it further evaluated.

## 2018-06-19 ENCOUNTER — Other Ambulatory Visit: Payer: Self-pay

## 2018-06-19 ENCOUNTER — Encounter (HOSPITAL_COMMUNITY): Payer: Self-pay | Admitting: Emergency Medicine

## 2018-06-19 ENCOUNTER — Emergency Department (HOSPITAL_COMMUNITY)
Admission: EM | Admit: 2018-06-19 | Discharge: 2018-06-19 | Disposition: A | Discharge status: Home / Self Care | Payer: Medicare HMO | Attending: Emergency Medicine | Admitting: Emergency Medicine

## 2018-06-19 ENCOUNTER — Emergency Department (HOSPITAL_COMMUNITY): Payer: Medicare HMO

## 2018-06-19 DIAGNOSIS — I10 Essential (primary) hypertension: Secondary | ICD-10-CM | POA: Insufficient documentation

## 2018-06-19 DIAGNOSIS — Z96652 Presence of left artificial knee joint: Secondary | ICD-10-CM | POA: Diagnosis not present

## 2018-06-19 DIAGNOSIS — R1084 Generalized abdominal pain: Secondary | ICD-10-CM | POA: Diagnosis present

## 2018-06-19 DIAGNOSIS — J45909 Unspecified asthma, uncomplicated: Secondary | ICD-10-CM | POA: Diagnosis not present

## 2018-06-19 DIAGNOSIS — Z79899 Other long term (current) drug therapy: Secondary | ICD-10-CM | POA: Insufficient documentation

## 2018-06-19 LAB — URINALYSIS, ROUTINE W REFLEX MICROSCOPIC
Bilirubin Urine: NEGATIVE
Glucose, UA: NEGATIVE mg/dL
Hgb urine dipstick: NEGATIVE
Ketones, ur: NEGATIVE mg/dL
Leukocytes, UA: NEGATIVE
Nitrite: NEGATIVE
Protein, ur: NEGATIVE mg/dL
Specific Gravity, Urine: 1.01 (ref 1.005–1.030)
pH: 7 (ref 5.0–8.0)

## 2018-06-19 LAB — COMPREHENSIVE METABOLIC PANEL
ALT: 21 U/L (ref 0–44)
AST: 38 U/L (ref 15–41)
Albumin: 3.8 g/dL (ref 3.5–5.0)
Alkaline Phosphatase: 93 U/L (ref 38–126)
Anion gap: 12 (ref 5–15)
BUN: <5 mg/dL — ABNORMAL LOW (ref 6–20)
CO2: 24 mmol/L (ref 22–32)
Calcium: 9.2 mg/dL (ref 8.9–10.3)
Chloride: 99 mmol/L (ref 98–111)
Creatinine, Ser: 0.64 mg/dL (ref 0.61–1.24)
GFR calc Af Amer: >60 mL/min (ref >60)
GFR calc non Af Amer: >60 mL/min (ref >60)
Glucose, Bld: 94 mg/dL (ref 70–99)
Potassium: 3.6 mmol/L (ref 3.5–5.1)
Sodium: 135 mmol/L (ref 135–145)
Total Bilirubin: 1.2 mg/dL (ref 0.3–1.2)
Total Protein: 7.9 g/dL (ref 6.5–8.1)

## 2018-06-19 LAB — LIPASE, BLOOD: Lipase: 39 U/L (ref 11–51)

## 2018-06-19 LAB — CBC
HCT: 39.9 % (ref 39.0–52.0)
Hemoglobin: 13.3 g/dL (ref 13.0–17.0)
MCH: 28.6 pg (ref 26.0–34.0)
MCHC: 33.3 g/dL (ref 30.0–36.0)
MCV: 85.8 fL (ref 78.0–100.0)
Platelets: 239 10*3/uL (ref 150–400)
RBC: 4.65 MIL/uL (ref 4.22–5.81)
RDW: 13.2 % (ref 11.5–15.5)
WBC: 5.2 10*3/uL (ref 4.0–10.5)

## 2018-06-19 MED ORDER — ONDANSETRON HCL 4 MG/2ML IJ SOLN
4.0000 mg | Freq: Once | INTRAMUSCULAR | Status: AC
  Administered 2018-06-19: 4 mg via INTRAVENOUS
  Filled 2018-06-19: qty 2

## 2018-06-19 MED ORDER — IOPAMIDOL (ISOVUE-370) INJECTION 76%: 100.0000 mL | Freq: Once | INTRAVENOUS | Status: DC | PRN

## 2018-06-19 MED ORDER — LACTATED RINGERS IV BOLUS
1000.0000 mL | Freq: Once | INTRAVENOUS | Status: AC
  Administered 2018-06-19: 1000 mL via INTRAVENOUS

## 2018-06-19 MED ORDER — ONDANSETRON 4 MG PO TBDP
4.0000 mg | ORAL_TABLET | Freq: Once | ORAL | Status: AC | PRN
  Administered 2018-06-19: 4 mg via ORAL
  Filled 2018-06-19: qty 1

## 2018-06-19 MED ORDER — PANTOPRAZOLE SODIUM 20 MG PO TBEC: 40.0000 mg | DELAYED_RELEASE_TABLET | Freq: Every day | ORAL | 0 refills | Status: DC

## 2018-06-19 MED ORDER — IOPAMIDOL (ISOVUE-300) INJECTION 61%
100.0000 mL | Freq: Once | INTRAVENOUS | Status: AC | PRN
  Administered 2018-06-19: 100 mL via INTRAVENOUS

## 2018-06-19 MED ORDER — IOPAMIDOL (ISOVUE-300) INJECTION 61%
INTRAVENOUS | Status: AC
  Filled 2018-06-19: qty 100
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
## 2018-06-19 NOTE — ED Notes (Signed)
Discharge instructions and prescriptions discussed with Pt. Pt verbalized understanding. Pt stable and ambulatory.   

## 2018-06-19 NOTE — ED Triage Notes (Signed)
Pt reports generalized abdominal pain radiating to back starting today. Pt reports N/V/D. Denies fever or changes in urination

## 2018-06-19 NOTE — ED Notes (Signed)
Patient transported to CT 

## 2018-07-09 ENCOUNTER — Encounter (HOSPITAL_COMMUNITY): Payer: Self-pay

## 2018-07-09 ENCOUNTER — Ambulatory Visit (HOSPITAL_COMMUNITY)
Admission: EM | Admit: 2018-07-09 | Discharge: 2018-07-09 | Disposition: A | Discharge status: Home / Self Care | Payer: Medicare HMO | Attending: Family Medicine | Admitting: Family Medicine

## 2018-07-09 ENCOUNTER — Other Ambulatory Visit: Payer: Self-pay

## 2018-07-09 DIAGNOSIS — L0201 Cutaneous abscess of face: Secondary | ICD-10-CM

## 2018-07-09 DIAGNOSIS — L0291 Cutaneous abscess, unspecified: Secondary | ICD-10-CM

## 2018-07-09 MED ORDER — IBUPROFEN 800 MG PO TABS: 800.0000 mg | ORAL_TABLET | Freq: Three times a day (TID) | ORAL | 0 refills | Status: DC

## 2018-07-09 MED ORDER — LIDOCAINE HCL (PF) 2 % IJ SOLN
INTRAMUSCULAR | Status: AC
  Filled 2018-07-09: qty 2

## 2018-07-09 MED ORDER — SULFAMETHOXAZOLE-TRIMETHOPRIM 800-160 MG PO TABS: 1.0000 | ORAL_TABLET | Freq: Two times a day (BID) | ORAL | 0 refills | Status: AC

## 2018-07-09 NOTE — Discharge Instructions (Addendum)
Keep dressing in place until tomorrow.  Then may removed, replace daily.  Wash daily with soap and water.  Complete course of antibiotics.   Keep covered to keep clean.  Warm compresses to promote further drainage.  Please continue to follow up with your primary care provider and/or Dermatology as this may recur
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
## 2018-07-09 NOTE — ED Triage Notes (Signed)
Pt states he has a abscess on his face (left side) it been there for about a month.

## 2018-08-28 ENCOUNTER — Encounter: Payer: Self-pay | Admitting: Radiation Oncology

## 2018-08-28 NOTE — Progress Notes (Signed)
GU Location of Tumor / Histology: prostatic adenocarcinoma  If Prostate Cancer, Gleason Score is (3 + 4) and PSA is (7.2) Prostate volume: 28.1 cm   Chad Turner presented to Dr. Lovena Neighbours in August 2019 for further evaluation of an elevated PSA.  Biopsies of prostate (if applicable) revealed:    Past/Anticipated interventions by urology, if any: prostate biopsy, CT abdomen/pelvis (negative), referral to radiation oncology  Past/Anticipated interventions by medical oncology, if any: no  Weight changes, if any: no  Bowel/Bladder complaints, if any: IPSS 8. SHIM 19. Reports occasional urgency, frequency and nocturia x 1. Denies dysuria, hematuria, urinary leakage or incontinence.  Nausea/Vomiting, if any: no  Pain issues, if any:  Lower back   SAFETY ISSUES:  Prior radiation? no  Pacemaker/ICD? no  Possible current pregnancy? no  Is the patient on methotrexate? no  Current Complaints / other details:  57 year old male. Engaged. Has two daughters. Resides in [place]. Reports maternal grandfather had colon cancer.

## 2018-08-29 ENCOUNTER — Ambulatory Visit
Admission: RE | Admit: 2018-08-29 | Discharge: 2018-08-29 | Disposition: A | Discharge status: Home / Self Care | Payer: Medicare HMO | Source: Ambulatory Visit | Attending: Radiation Oncology | Admitting: Radiation Oncology

## 2018-08-29 ENCOUNTER — Encounter: Payer: Self-pay | Admitting: Radiation Oncology

## 2018-08-29 ENCOUNTER — Other Ambulatory Visit: Payer: Self-pay

## 2018-08-29 VITALS — HR 105 | Temp 98.6°F | Resp 20 | Ht 70.0 in | Wt 204.6 lb

## 2018-08-29 DIAGNOSIS — I10 Essential (primary) hypertension: Secondary | ICD-10-CM | POA: Diagnosis not present

## 2018-08-29 DIAGNOSIS — Z79899 Other long term (current) drug therapy: Secondary | ICD-10-CM | POA: Insufficient documentation

## 2018-08-29 DIAGNOSIS — C61 Malignant neoplasm of prostate: Secondary | ICD-10-CM

## 2018-08-29 DIAGNOSIS — Z880 Allergy status to penicillin: Secondary | ICD-10-CM | POA: Insufficient documentation

## 2018-08-29 HISTORY — DX: Malignant neoplasm of prostate: C61

## 2018-08-29 NOTE — Progress Notes (Signed)
See progress note under physician encounter. 
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
## 2018-08-31 ENCOUNTER — Encounter: Payer: Self-pay | Admitting: General Practice

## 2018-08-31 NOTE — Progress Notes (Signed)
[facility] Psychosocial Distress Screening Clinical Social Work  Clinical Social Work was referred by distress screening protocol.  The patient scored a 8 on the Psychosocial Distress Thermometer which indicates moderate distress. Clinical Social Worker contacted patient by phone to assess for distress and other psychosocial needs. "I have my good days and my bad days.  I've heard there's a 90 percent chance of a cure and I can live a normal life."   Has decided on treatment plan.  Has support from family - is motivated to pursue treatment in order to 'live for my kids and grandkids."  Will mail information packet on [employer], including Prostate Cancer support group.  Encouraged patient to connect w resources as needed.    ONCBCN DISTRESS SCREENING 08/29/2018  Screening Type Initial Screening  Distress experienced in past week (1-10) 8  Emotional problem type Depression;Nervousness/Anxiety;Adjusting to illness;Boredom  Information Concerns Type Lack of info about treatment;Lack of info about complementary therapy choices  Physical Problem type Pain;Sleep/insomnia;Constipation/diarrhea  Physician notified of physical symptoms Yes  Referral to clinical psychology No  Referral to clinical social work No  Referral to dietition No  Referral to financial advocate No  Referral to support programs No  Referral to palliative care No    Clinical Social Worker follow up needed: No.  If yes, follow up plan:  Beverely Pace, [place], LCSW Clinical Social Worker Phone:  [phone]

## 2018-09-03 ENCOUNTER — Encounter: Payer: Self-pay | Admitting: Radiation Oncology

## 2018-09-03 DIAGNOSIS — C61 Malignant neoplasm of prostate: Secondary | ICD-10-CM | POA: Insufficient documentation

## 2018-09-05 ENCOUNTER — Telehealth: Payer: Self-pay | Admitting: *Deleted

## 2018-09-05 NOTE — Telephone Encounter (Signed)
CALLED PATIENT TO INFORM OF PRE-SEED PLANNING CT FOR 10-05-18 - ARRIVAL TIME - 9:45 AM @ [facility], SPOKE WITH PATIENT AND HE IS AWARE OF THIS APPT.

## 2018-09-10 ENCOUNTER — Other Ambulatory Visit: Payer: Self-pay | Admitting: Urology

## 2018-09-11 ENCOUNTER — Other Ambulatory Visit: Payer: Self-pay | Admitting: Urology

## 2018-09-12 ENCOUNTER — Telehealth: Payer: Self-pay | Admitting: *Deleted

## 2018-09-12 NOTE — Telephone Encounter (Signed)
CALLED PATIENT TO INFORM OF IMPLANT DATE, SPOKE WITH PATIENT AND HE IS AWARE OF THIS DATE 

## 2018-09-13 ENCOUNTER — Encounter: Payer: Self-pay | Admitting: Medical Oncology

## 2018-09-13 NOTE — Progress Notes (Signed)
Spoke with Mr. Wence to introduce myself as the prostate nurse navigator and my role. I was unable to meet him the day he consulted with Dr. Tammi Klippel. He has decided on brachytherapy as treatment. He had questions about procedure and these were answered. He confirmed his appointment for CT simulation 12/06 at 10:00 am. I asked him to call with questions or concerns.

## 2018-09-26 ENCOUNTER — Emergency Department (HOSPITAL_COMMUNITY): Payer: Medicare HMO

## 2018-09-26 ENCOUNTER — Encounter (HOSPITAL_COMMUNITY): Payer: Self-pay | Admitting: Emergency Medicine

## 2018-09-26 ENCOUNTER — Emergency Department (HOSPITAL_COMMUNITY)
Admission: EM | Admit: 2018-09-26 | Discharge: 2018-09-26 | Disposition: A | Discharge status: Home / Self Care | Payer: Medicare HMO | Attending: Emergency Medicine | Admitting: Emergency Medicine

## 2018-09-26 DIAGNOSIS — Z79899 Other long term (current) drug therapy: Secondary | ICD-10-CM | POA: Insufficient documentation

## 2018-09-26 DIAGNOSIS — I10 Essential (primary) hypertension: Secondary | ICD-10-CM | POA: Diagnosis not present

## 2018-09-26 DIAGNOSIS — K7460 Unspecified cirrhosis of liver: Secondary | ICD-10-CM | POA: Diagnosis not present

## 2018-09-26 DIAGNOSIS — J45909 Unspecified asthma, uncomplicated: Secondary | ICD-10-CM | POA: Diagnosis not present

## 2018-09-26 DIAGNOSIS — R1110 Vomiting, unspecified: Secondary | ICD-10-CM | POA: Diagnosis present

## 2018-09-26 DIAGNOSIS — Z96652 Presence of left artificial knee joint: Secondary | ICD-10-CM | POA: Diagnosis not present

## 2018-09-26 LAB — COMPREHENSIVE METABOLIC PANEL
ALT: 65 U/L — ABNORMAL HIGH (ref 0–44)
AST: 99 U/L — ABNORMAL HIGH (ref 15–41)
Albumin: 3.7 g/dL (ref 3.5–5.0)
Alkaline Phosphatase: 137 U/L — ABNORMAL HIGH (ref 38–126)
Anion gap: 14 (ref 5–15)
BUN: <5 mg/dL — ABNORMAL LOW (ref 6–20)
CO2: 21 mmol/L — ABNORMAL LOW (ref 22–32)
Calcium: 8.9 mg/dL (ref 8.9–10.3)
Chloride: 92 mmol/L — ABNORMAL LOW (ref 98–111)
Creatinine, Ser: 0.83 mg/dL (ref 0.61–1.24)
GFR calc Af Amer: >60 mL/min (ref >60)
GFR calc non Af Amer: >60 mL/min (ref >60)
Glucose, Bld: 117 mg/dL — ABNORMAL HIGH (ref 70–99)
Potassium: 2.8 mmol/L — ABNORMAL LOW (ref 3.5–5.1)
Sodium: 127 mmol/L — ABNORMAL LOW (ref 135–145)
Total Bilirubin: 2.1 mg/dL — ABNORMAL HIGH (ref 0.3–1.2)
Total Protein: 7.6 g/dL (ref 6.5–8.1)

## 2018-09-26 LAB — CBC WITH DIFFERENTIAL/PLATELET
Abs Immature Granulocytes: 0.01 10*3/uL (ref 0.00–0.07)
Basophils Absolute: 0 10*3/uL (ref 0.0–0.1)
Basophils Relative: 1 %
Eosinophils Absolute: 0.1 10*3/uL (ref 0.0–0.5)
Eosinophils Relative: 1 %
HCT: 39.7 % (ref 39.0–52.0)
Hemoglobin: 13.3 g/dL (ref 13.0–17.0)
Immature Granulocytes: 0 %
Lymphocytes Relative: 30 %
Lymphs Abs: 1.9 10*3/uL (ref 0.7–4.0)
MCH: 28.6 pg (ref 26.0–34.0)
MCHC: 33.5 g/dL (ref 30.0–36.0)
MCV: 85.4 fL (ref 80.0–100.0)
Monocytes Absolute: 0.6 10*3/uL (ref 0.1–1.0)
Monocytes Relative: 9 %
Neutro Abs: 3.8 10*3/uL (ref 1.7–7.7)
Neutrophils Relative %: 59 %
Platelets: 311 10*3/uL (ref 150–400)
RBC: 4.65 MIL/uL (ref 4.22–5.81)
RDW: 13.2 % (ref 11.5–15.5)
WBC: 6.4 10*3/uL (ref 4.0–10.5)
nRBC: 0 % (ref 0.0–0.2)

## 2018-09-26 LAB — URINALYSIS, ROUTINE W REFLEX MICROSCOPIC
Bilirubin Urine: NEGATIVE
Glucose, UA: NEGATIVE mg/dL
Hgb urine dipstick: NEGATIVE
Ketones, ur: NEGATIVE mg/dL
Leukocytes, UA: NEGATIVE
Nitrite: NEGATIVE
Protein, ur: NEGATIVE mg/dL
Specific Gravity, Urine: 1.002 — ABNORMAL LOW (ref 1.005–1.030)
pH: 7 (ref 5.0–8.0)

## 2018-09-26 LAB — LIPASE, BLOOD: Lipase: 23 U/L (ref 11–51)

## 2018-09-26 MED ORDER — ONDANSETRON 8 MG PO TBDP: 8.0000 mg | ORAL_TABLET | Freq: Three times a day (TID) | ORAL | 0 refills | Status: DC | PRN

## 2018-09-26 MED ORDER — ONDANSETRON HCL 4 MG/2ML IJ SOLN
4.0000 mg | Freq: Once | INTRAMUSCULAR | Status: AC
  Administered 2018-09-26: 4 mg via INTRAVENOUS
  Filled 2018-09-26: qty 2

## 2018-09-26 MED ORDER — LACTATED RINGERS IV BOLUS
1000.0000 mL | Freq: Once | INTRAVENOUS | Status: AC
  Administered 2018-09-26: 1000 mL via INTRAVENOUS

## 2018-09-26 MED ORDER — HYDROMORPHONE HCL 1 MG/ML IJ SOLN
0.5000 mg | Freq: Once | INTRAMUSCULAR | Status: AC
  Administered 2018-09-26: 0.5 mg via INTRAVENOUS
  Filled 2018-09-26: qty 1

## 2018-09-26 MED ORDER — CHLORDIAZEPOXIDE HCL 25 MG PO CAPS: ORAL_CAPSULE | ORAL | 0 refills | Status: DC

## 2018-09-26 NOTE — ED Triage Notes (Signed)
Pt has upper abd pain with vomiting and diarrhea. Pt states he is a daily drinker but hasn't drank in 1 day due to pain. Hx of prostrate cancer.

## 2018-09-26 NOTE — ED Notes (Signed)
Pt to US.
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
## 2018-10-02 IMAGING — CT CT ABD-PELV W/ CM
2 of 5 series · 16 of 46 positions shown, 18 images · IV contrast (iopamidol)
Comparison: 12/13/2017 abdomen MRI. 06/03/2016 CT abdomen and
pelvis.

CLINICAL DATA: 57 y/o M; abdominal pain with nausea, vomiting, and
diarrhea.

EXAM:
CT ABDOMEN AND PELVIS WITH CONTRAST
TECHNIQUE: Multidetector CT imaging of the abdomen and pelvis was performed
using the standard protocol following bolus administration of
intravenous contrast.
CONTRAST:  100mL [id number] IOPAMIDOL ([id number]) INJECTION 61%

[Series 3: abdomen 5.0 · axial · 0.79mm/px · z∈[+324,+729]mm · 13 of 95 slices shown, 15 images]
[im 7/95  soft-tissue]
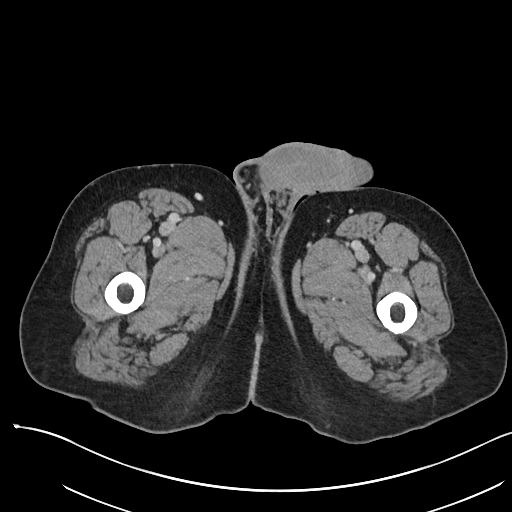
[im 7/95  bone]
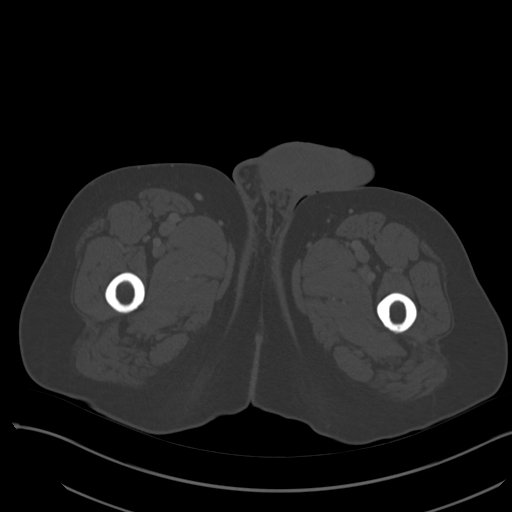
[im 13/95  soft-tissue]
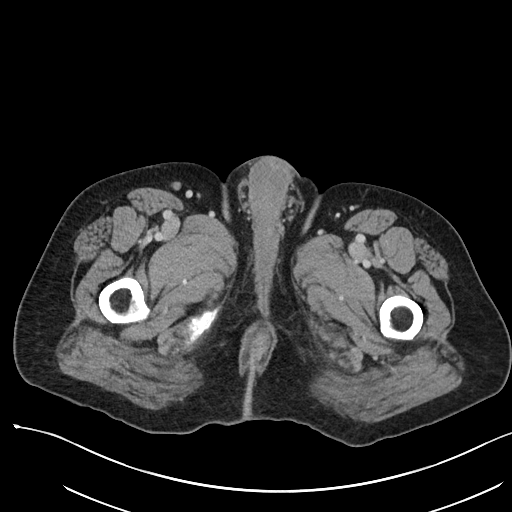
[im 19/95  soft-tissue]
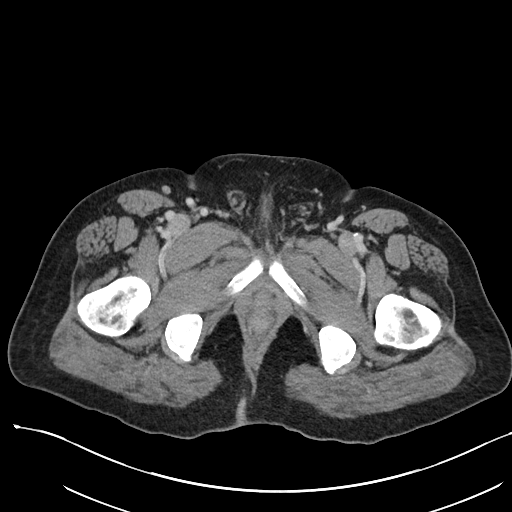
[im 26/95  soft-tissue]
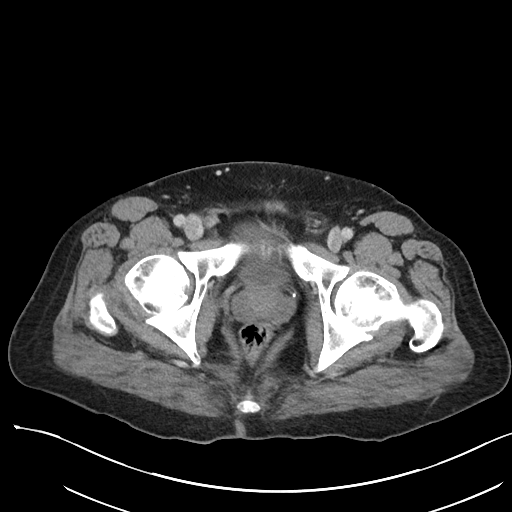
[im 32/95  soft-tissue]
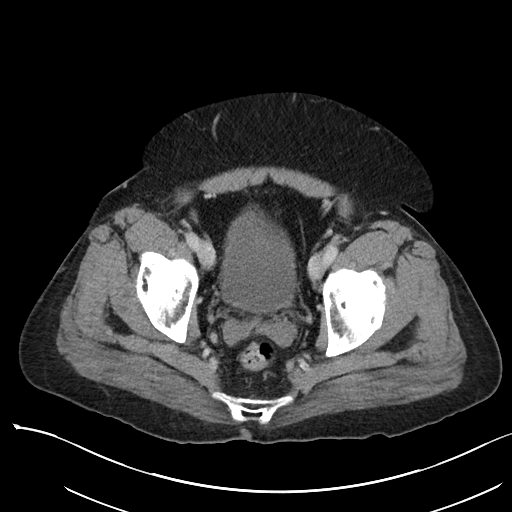
[im 38/95  soft-tissue]
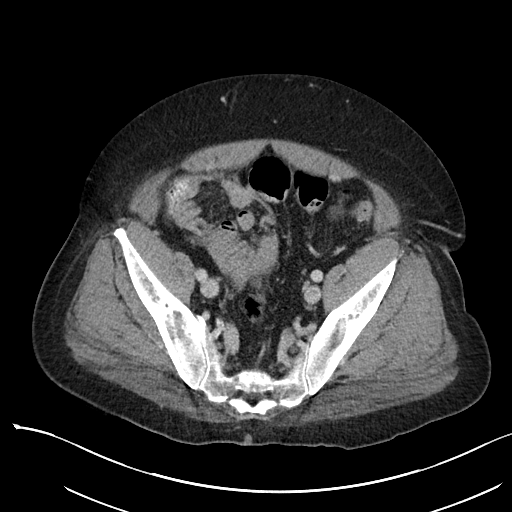
[im 51/95  soft-tissue]
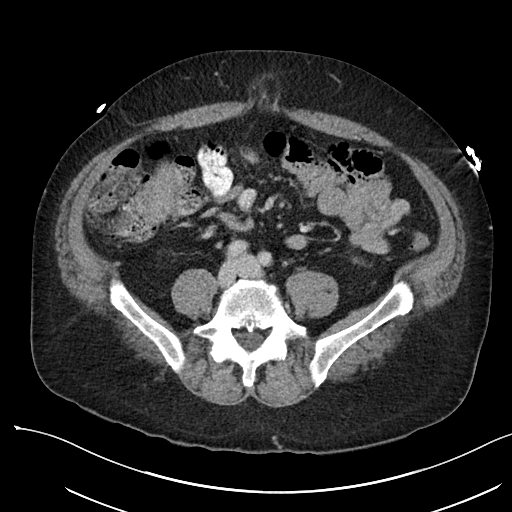
[im 57/95  soft-tissue]
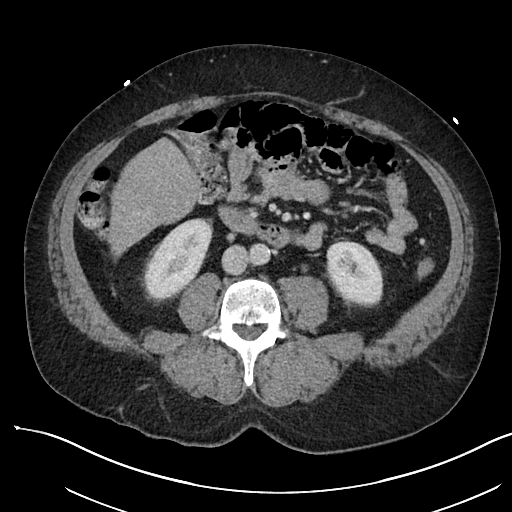
[im 63/95  soft-tissue]
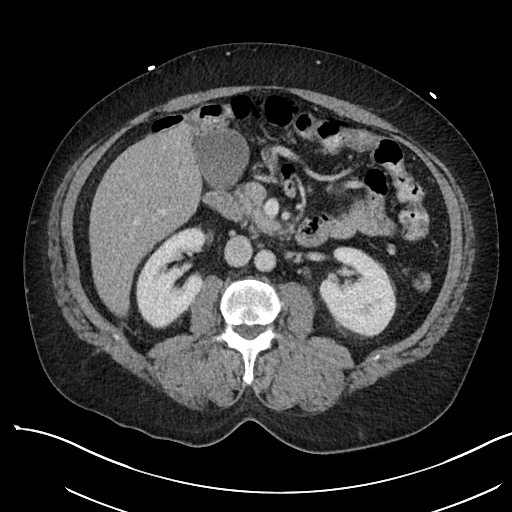
[im 63/95  bone]
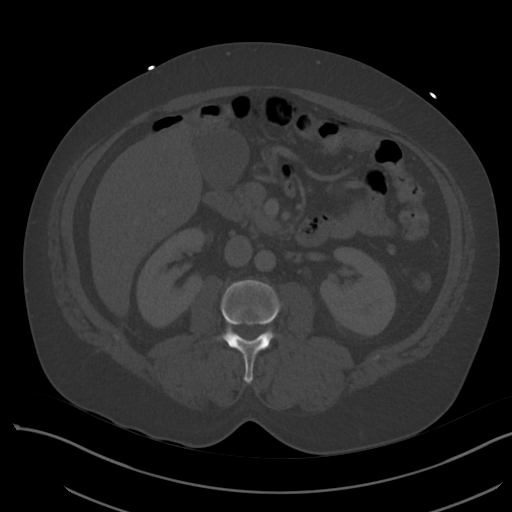
[im 69/95  soft-tissue]
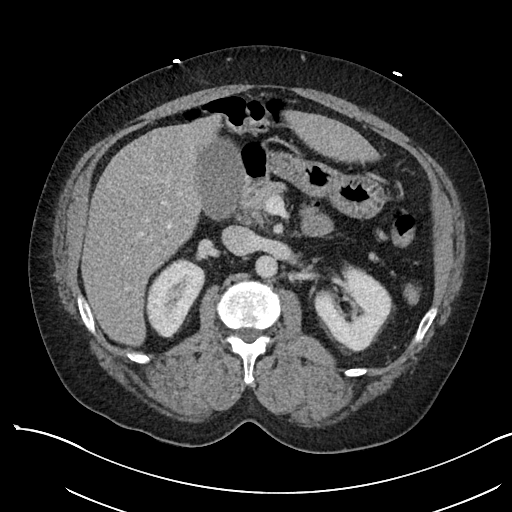
[im 76/95  soft-tissue]
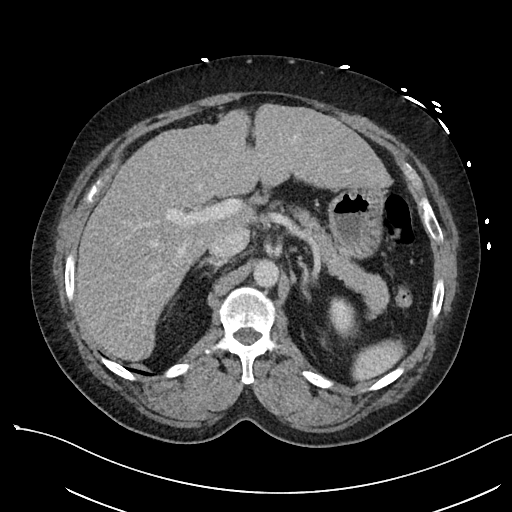
[im 82/95  soft-tissue]
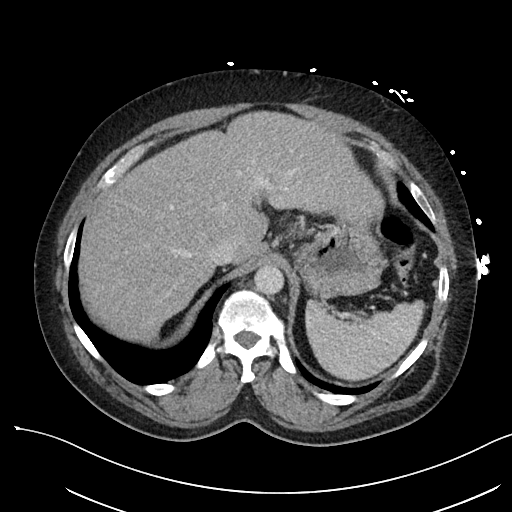
[im 88/95  soft-tissue]
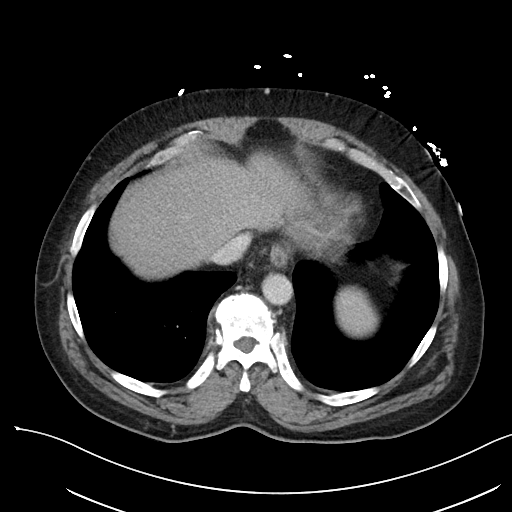

[Series 6: abdomen 3.0 mpr cor · coronal · 0.76mm/px · 3 of 108 slices shown]
[im 36/108  soft-tissue]
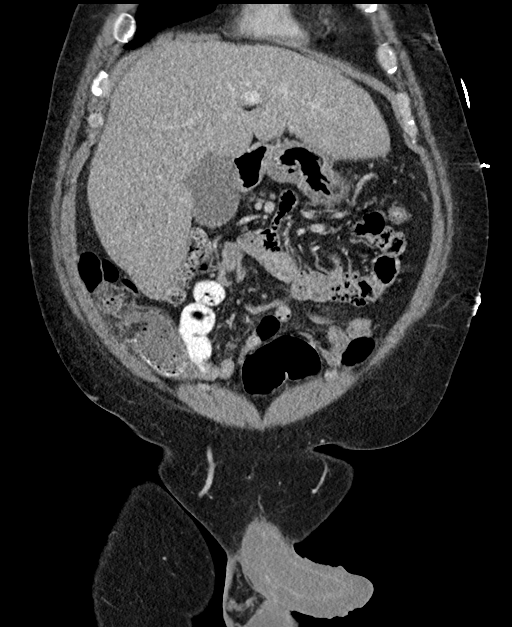
[im 48/108  soft-tissue]
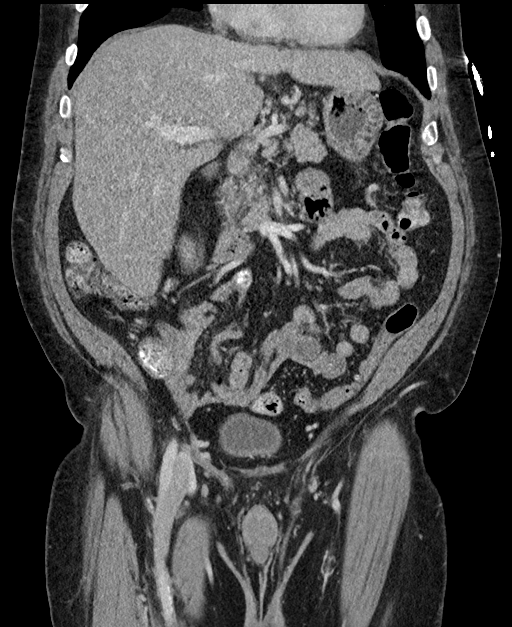
[im 60/108  soft-tissue]
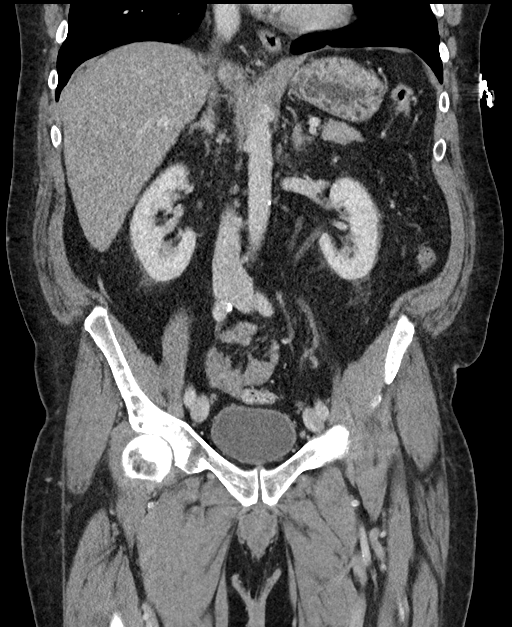

[16 of 46 positions shown; findings below may reference images not displayed]

FINDINGS: Lower chest: No acute abnormality.

Hepatobiliary: Hepatomegaly, hepatic steatosis, and findings of
cirrhosis with lobulated liver contour are stable.

Pancreas: Unremarkable. No pancreatic ductal dilatation or
surrounding inflammatory changes.

Spleen: Normal in size without focal abnormality.

Adrenals/Urinary Tract: Adrenal glands are unremarkable. Stable mild
nonspecific perinephric fat stranding. Kidneys are normal, without
renal calculi, focal lesion, or hydronephrosis. Bladder is
unremarkable.

Stomach/Bowel: Stomach is within normal limits. Appendix appears
normal. No evidence of bowel wall thickening, distention, or
inflammatory changes.

Vascular/Lymphatic: Aortic atherosclerosis. No enlarged abdominal or
pelvic lymph nodes.

Reproductive: Prostate is unremarkable.

Other: Stable small paraumbilical hernia containing fat.

Musculoskeletal: No acute or significant osseous findings.
IMPRESSION: 1. No acute process identified.
2. Stable hepatomegaly, steatosis, and findings of cirrhosis.
3. Stable small paraumbilical hernia containing fat.

By: Liu Honeycutt M.D.

## 2018-10-04 ENCOUNTER — Telehealth: Payer: Self-pay | Admitting: *Deleted

## 2018-10-04 NOTE — Telephone Encounter (Signed)
Called patient to remind of pre-seed planning CT and his chest x-ray for 10-05-18, spoke with patient and he is aware of these appts.

## 2018-10-05 ENCOUNTER — Encounter (HOSPITAL_COMMUNITY)
Admission: RE | Admit: 2018-10-05 | Discharge: 2018-10-05 | Disposition: A | Discharge status: Home / Self Care | Payer: Medicare HMO | Source: Ambulatory Visit | Attending: Urology | Admitting: Urology

## 2018-10-05 ENCOUNTER — Ambulatory Visit (HOSPITAL_COMMUNITY)
Admission: RE | Admit: 2018-10-05 | Discharge: 2018-10-05 | Disposition: A | Discharge status: Home / Self Care | Payer: Medicare HMO | Source: Ambulatory Visit | Attending: Urology | Admitting: Urology

## 2018-10-05 ENCOUNTER — Ambulatory Visit
Admission: RE | Admit: 2018-10-05 | Discharge: 2018-10-05 | Disposition: A | Discharge status: Home / Self Care | Payer: Medicare HMO | Source: Ambulatory Visit | Attending: Radiation Oncology | Admitting: Radiation Oncology

## 2018-10-05 ENCOUNTER — Other Ambulatory Visit: Payer: Self-pay | Admitting: Urology

## 2018-10-05 ENCOUNTER — Encounter: Payer: Self-pay | Admitting: Medical Oncology

## 2018-10-05 VITALS — BP 130/87 | HR 89 | Temp 97.7°F | Resp 20 | Wt 202.2 lb

## 2018-10-05 DIAGNOSIS — C61 Malignant neoplasm of prostate: Secondary | ICD-10-CM | POA: Diagnosis not present

## 2018-10-05 NOTE — Progress Notes (Signed)
  Radiation Oncology         [phone] ________________________________  Name: CARLIN ATTRIDGE MRN: [id number]  Date: 10/05/2018  DOB: 01/25/1961  SIMULATION AND TREATMENT PLANNING NOTE PUBIC ARCH STUDY  WU:[id number], [facility], [facility]  DIAGNOSIS:  57 year-old gentleman with Stage T1c adenocarcinoma of the prostate with Gleason Score of 3+4, and PSA of 7.2.     ICD-10-CM   1. Malignant neoplasm of prostate ([place]) C61     COMPLEX SIMULATION:  The patient presented today for evaluation for possible prostate seed implant. He was brought to the radiation planning suite and placed supine on the CT couch. A 3-dimensional image study set was obtained in upload to the planning computer. There, on each axial slice, I contoured the prostate gland. Then, using three-dimensional radiation planning tools I reconstructed the prostate in view of the structures from the transperineal needle pathway to assess for possible pubic arch interference. In doing so, I did not appreciate any pubic arch interference. Also, the patient's prostate volume was estimated based on the drawn structure. The volume was 28 cc.  Given the pubic arch appearance and prostate volume, patient remains a good candidate to proceed with prostate seed implant. Today, he freely provided informed written consent to proceed.    PLAN: The patient will undergo prostate seed implant.   ________________________________  Sheral Apley. Tammi Klippel, M.D.

## 2018-10-25 ENCOUNTER — Encounter (HOSPITAL_BASED_OUTPATIENT_CLINIC_OR_DEPARTMENT_OTHER): Payer: Self-pay | Admitting: *Deleted

## 2018-10-25 ENCOUNTER — Other Ambulatory Visit: Payer: Self-pay

## 2018-10-25 NOTE — Progress Notes (Signed)
Spoke w/ pt via phone for pre-op interview.  Pt verbalized understanding to arrive at 1100 at [facility] and be npo after mn with exception clear liquids until 0700 then nothing by mouth including water,candy,gum,mints.  Current CXR/ EKG in chart and epic.  Getting lab work done 11-01-2018 @ 1100 .  Will take norvasc am dos w/ sips of water and do one fleet enema.  Pt verbalized understanding to stop marijuana today and do not use cocaine again prior to surgery.

## 2018-11-01 ENCOUNTER — Encounter (HOSPITAL_COMMUNITY)
Admission: RE | Admit: 2018-11-01 | Discharge: 2018-11-01 | Disposition: A | Discharge status: Home / Self Care | Payer: Medicare HMO | Source: Ambulatory Visit | Attending: Urology | Admitting: Urology

## 2018-11-01 DIAGNOSIS — Z01812 Encounter for preprocedural laboratory examination: Secondary | ICD-10-CM | POA: Insufficient documentation

## 2018-11-01 LAB — COMPREHENSIVE METABOLIC PANEL
ALT: 35 U/L (ref 0–44)
AST: 55 U/L — ABNORMAL HIGH (ref 15–41)
Albumin: 4.4 g/dL (ref 3.5–5.0)
Alkaline Phosphatase: 156 U/L — ABNORMAL HIGH (ref 38–126)
Anion gap: 12 (ref 5–15)
BUN: 6 mg/dL (ref 6–20)
CO2: 23 mmol/L (ref 22–32)
Calcium: 9.1 mg/dL (ref 8.9–10.3)
Chloride: 96 mmol/L — ABNORMAL LOW (ref 98–111)
Creatinine, Ser: 0.85 mg/dL (ref 0.61–1.24)
GFR calc Af Amer: >60 mL/min (ref >60)
GFR calc non Af Amer: >60 mL/min (ref >60)
Glucose, Bld: 110 mg/dL — ABNORMAL HIGH (ref 70–99)
Potassium: 4.5 mmol/L (ref 3.5–5.1)
Sodium: 131 mmol/L — ABNORMAL LOW (ref 135–145)
Total Bilirubin: 1.4 mg/dL — ABNORMAL HIGH (ref 0.3–1.2)
Total Protein: 8.4 g/dL — ABNORMAL HIGH (ref 6.5–8.1)

## 2018-11-01 LAB — CBC
HCT: 41.4 % (ref 39.0–52.0)
Hemoglobin: 13.7 g/dL (ref 13.0–17.0)
MCH: 29.3 pg (ref 26.0–34.0)
MCHC: 33.1 g/dL (ref 30.0–36.0)
MCV: 88.5 fL (ref 80.0–100.0)
Platelets: 325 10*3/uL (ref 150–400)
RBC: 4.68 MIL/uL (ref 4.22–5.81)
RDW: 13.8 % (ref 11.5–15.5)
WBC: 6.7 10*3/uL (ref 4.0–10.5)
nRBC: 0 % (ref 0.0–0.2)

## 2018-11-01 LAB — APTT: aPTT: 38 seconds — ABNORMAL HIGH (ref 24–36)

## 2018-11-01 LAB — PROTIME-INR
INR: 1.04
Prothrombin Time: 13.5 seconds (ref 11.4–15.2)

## 2018-11-01 NOTE — Progress Notes (Signed)
CBC, CMET, PT, PTT RESULTS FAXED TO DR Lovena Neighbours BY [facility]

## 2018-11-08 ENCOUNTER — Telehealth: Payer: Self-pay | Admitting: *Deleted

## 2018-11-08 NOTE — Telephone Encounter (Signed)
CALLED PATIENT TO REMIND OF PROCEDURE FOR 11/09/18, SPOKE WITH PATIENT AND HE IS AWARE OF THIS PROCEDURE

## 2018-11-09 ENCOUNTER — Ambulatory Visit (HOSPITAL_COMMUNITY): Payer: Medicare HMO

## 2018-11-09 ENCOUNTER — Encounter (HOSPITAL_BASED_OUTPATIENT_CLINIC_OR_DEPARTMENT_OTHER)
Admission: RE | Disposition: A | Discharge status: Home / Self Care | Payer: Self-pay | Source: Other Acute Inpatient Hospital | Attending: Urology

## 2018-11-09 ENCOUNTER — Ambulatory Visit (HOSPITAL_BASED_OUTPATIENT_CLINIC_OR_DEPARTMENT_OTHER): Payer: Medicare HMO | Admitting: Anesthesiology

## 2018-11-09 ENCOUNTER — Ambulatory Visit (HOSPITAL_BASED_OUTPATIENT_CLINIC_OR_DEPARTMENT_OTHER): Payer: Medicare HMO | Admitting: Physician Assistant

## 2018-11-09 ENCOUNTER — Encounter (HOSPITAL_BASED_OUTPATIENT_CLINIC_OR_DEPARTMENT_OTHER): Payer: Self-pay

## 2018-11-09 ENCOUNTER — Ambulatory Visit (HOSPITAL_BASED_OUTPATIENT_CLINIC_OR_DEPARTMENT_OTHER)
Admission: RE | Admit: 2018-11-09 | Discharge: 2018-11-09 | Disposition: A | Discharge status: Home / Self Care | Payer: Medicare HMO | Source: Other Acute Inpatient Hospital | Attending: Urology | Admitting: Urology

## 2018-11-09 DIAGNOSIS — Z79899 Other long term (current) drug therapy: Secondary | ICD-10-CM | POA: Insufficient documentation

## 2018-11-09 DIAGNOSIS — C61 Malignant neoplasm of prostate: Secondary | ICD-10-CM | POA: Insufficient documentation

## 2018-11-09 DIAGNOSIS — K7460 Unspecified cirrhosis of liver: Secondary | ICD-10-CM | POA: Insufficient documentation

## 2018-11-09 DIAGNOSIS — Z880 Allergy status to penicillin: Secondary | ICD-10-CM | POA: Diagnosis not present

## 2018-11-09 DIAGNOSIS — I10 Essential (primary) hypertension: Secondary | ICD-10-CM | POA: Diagnosis not present

## 2018-11-09 HISTORY — DX: Alcohol dependence, uncomplicated: F10.20

## 2018-11-09 HISTORY — PX: RADIOACTIVE SEED IMPLANT: SHX5150

## 2018-11-09 HISTORY — DX: Abnormal levels of other serum enzymes: R74.8

## 2018-11-09 HISTORY — DX: Reserved for inherently not codable concepts without codable children: IMO0001

## 2018-11-09 HISTORY — DX: Personal history of other (healed) physical injury and trauma: Z87.828

## 2018-11-09 HISTORY — PX: SPACE OAR INSTILLATION: SHX6769

## 2018-11-09 HISTORY — DX: Unspecified cirrhosis of liver: K74.60

## 2018-11-09 HISTORY — PX: CYSTOSCOPY: SHX5120

## 2018-11-09 SURGERY — INSERTION, RADIATION SOURCE, PROSTATE
Anesthesia: General | Site: Urethra

## 2018-11-09 MED ORDER — FENTANYL CITRATE (PF) 100 MCG/2ML IJ SOLN
25.0000 ug | INTRAMUSCULAR | Status: DC | PRN
  Administered 2018-11-09: 25 ug via INTRAVENOUS
  Filled 2018-11-09: qty 1

## 2018-11-09 MED ORDER — LIDOCAINE 2% (20 MG/ML) 5 ML SYRINGE
INTRAMUSCULAR | Status: AC
  Filled 2018-11-09: qty 5

## 2018-11-09 MED ORDER — DEXAMETHASONE SODIUM PHOSPHATE 10 MG/ML IJ SOLN
INTRAMUSCULAR | Status: AC
  Filled 2018-11-09: qty 1

## 2018-11-09 MED ORDER — FENTANYL CITRATE (PF) 100 MCG/2ML IJ SOLN
25.0000 ug | INTRAMUSCULAR | Status: DC | PRN
  Filled 2018-11-09: qty 1

## 2018-11-09 MED ORDER — LACTATED RINGERS IV SOLN
INTRAVENOUS | Status: DC
  Administered 2018-11-09 (×3): via INTRAVENOUS
  Filled 2018-11-09: qty 1000

## 2018-11-09 MED ORDER — CIPROFLOXACIN IN D5W 400 MG/200ML IV SOLN
INTRAVENOUS | Status: AC
  Filled 2018-11-09: qty 200

## 2018-11-09 MED ORDER — PHENYLEPHRINE 40 MCG/ML (10ML) SYRINGE FOR IV PUSH (FOR BLOOD PRESSURE SUPPORT)
PREFILLED_SYRINGE | INTRAVENOUS | Status: AC
  Filled 2018-11-09: qty 10

## 2018-11-09 MED ORDER — PHENYLEPHRINE HCL 10 MG/ML IJ SOLN
INTRAMUSCULAR | Status: AC
  Filled 2018-11-09: qty 1

## 2018-11-09 MED ORDER — CIPROFLOXACIN IN D5W 400 MG/200ML IV SOLN
400.0000 mg | INTRAVENOUS | Status: AC
  Administered 2018-11-09: 400 mg via INTRAVENOUS
  Filled 2018-11-09: qty 200

## 2018-11-09 MED ORDER — DEXAMETHASONE SODIUM PHOSPHATE 4 MG/ML IJ SOLN
INTRAMUSCULAR | Status: DC | PRN
  Administered 2018-11-09: 10 mg via INTRAVENOUS

## 2018-11-09 MED ORDER — MIDAZOLAM HCL 5 MG/5ML IJ SOLN
INTRAMUSCULAR | Status: DC | PRN
  Administered 2018-11-09: 2 mg via INTRAVENOUS

## 2018-11-09 MED ORDER — FENTANYL CITRATE (PF) 100 MCG/2ML IJ SOLN
INTRAMUSCULAR | Status: DC | PRN
  Administered 2018-11-09: 100 ug via INTRAVENOUS
  Administered 2018-11-09 (×4): 50 ug via INTRAVENOUS

## 2018-11-09 MED ORDER — ONDANSETRON HCL 4 MG/2ML IJ SOLN
4.0000 mg | Freq: Once | INTRAMUSCULAR | Status: DC | PRN
  Filled 2018-11-09: qty 2

## 2018-11-09 MED ORDER — EPHEDRINE 5 MG/ML INJ
INTRAVENOUS | Status: AC
  Filled 2018-11-09: qty 10

## 2018-11-09 MED ORDER — PHENYLEPHRINE HCL 10 MG/ML IJ SOLN
INTRAMUSCULAR | Status: DC | PRN
  Administered 2018-11-09: 100 ug/min via INTRAVENOUS

## 2018-11-09 MED ORDER — FENTANYL CITRATE (PF) 100 MCG/2ML IJ SOLN
INTRAMUSCULAR | Status: AC
  Filled 2018-11-09: qty 2

## 2018-11-09 MED ORDER — ONDANSETRON HCL 4 MG/2ML IJ SOLN
INTRAMUSCULAR | Status: AC
  Filled 2018-11-09: qty 2

## 2018-11-09 MED ORDER — FLEET ENEMA 7-19 GM/118ML RE ENEM
1.0000 | ENEMA | Freq: Once | RECTAL | Status: DC
  Filled 2018-11-09: qty 1

## 2018-11-09 MED ORDER — PROPOFOL 10 MG/ML IV BOLUS
INTRAVENOUS | Status: DC | PRN
  Administered 2018-11-09: 30 mg via INTRAVENOUS
  Administered 2018-11-09: 50 mg via INTRAVENOUS
  Administered 2018-11-09: 20 mg via INTRAVENOUS
  Administered 2018-11-09: 200 mg via INTRAVENOUS

## 2018-11-09 MED ORDER — SODIUM CHLORIDE FLUSH 0.9 % IV SOLN
INTRAVENOUS | Status: DC | PRN
  Administered 2018-11-09: 3 mL via INTRAVENOUS

## 2018-11-09 MED ORDER — MIDAZOLAM HCL 2 MG/2ML IJ SOLN
INTRAMUSCULAR | Status: AC
  Filled 2018-11-09: qty 2

## 2018-11-09 MED ORDER — LIDOCAINE 2% (20 MG/ML) 5 ML SYRINGE
INTRAMUSCULAR | Status: DC | PRN
  Administered 2018-11-09: 100 mg via INTRAVENOUS

## 2018-11-09 MED ORDER — PHENYLEPHRINE 40 MCG/ML (10ML) SYRINGE FOR IV PUSH (FOR BLOOD PRESSURE SUPPORT)
PREFILLED_SYRINGE | INTRAVENOUS | Status: DC | PRN
  Administered 2018-11-09: 160 ug via INTRAVENOUS
  Administered 2018-11-09 (×2): 120 ug via INTRAVENOUS

## 2018-11-09 MED ORDER — IOHEXOL 300 MG/ML  SOLN
INTRAMUSCULAR | Status: DC | PRN
  Administered 2018-11-09: 7 mL

## 2018-11-09 MED ORDER — TRAMADOL HCL 50 MG PO TABS: 50.0000 mg | ORAL_TABLET | Freq: Four times a day (QID) | ORAL | 0 refills | Status: AC | PRN

## 2018-11-09 MED ORDER — SODIUM CHLORIDE 0.9 % IV SOLN
INTRAVENOUS | Status: AC | PRN
  Administered 2018-11-09: 1000 mL

## 2018-11-09 MED ORDER — OXYCODONE HCL 5 MG PO TABS
5.0000 mg | ORAL_TABLET | Freq: Once | ORAL | Status: DC | PRN
  Filled 2018-11-09: qty 1

## 2018-11-09 MED ORDER — PROPOFOL 10 MG/ML IV BOLUS
INTRAVENOUS | Status: AC
  Filled 2018-11-09: qty 20

## 2018-11-09 MED ORDER — EPHEDRINE SULFATE-NACL 50-0.9 MG/10ML-% IV SOSY
PREFILLED_SYRINGE | INTRAVENOUS | Status: DC | PRN
  Administered 2018-11-09: 10 mg via INTRAVENOUS
  Administered 2018-11-09: 15 mg via INTRAVENOUS

## 2018-11-09 MED ORDER — OXYCODONE HCL 5 MG/5ML PO SOLN
5.0000 mg | Freq: Once | ORAL | Status: DC | PRN
  Filled 2018-11-09: qty 5

## 2018-11-09 MED ORDER — ONDANSETRON HCL 4 MG/2ML IJ SOLN
INTRAMUSCULAR | Status: DC | PRN
  Administered 2018-11-09: 4 mg via INTRAVENOUS

## 2018-11-09 SURGICAL SUPPLY — 44 items
BAG URINE DRAINAGE (UROLOGICAL SUPPLIES) ×3 IMPLANT
BLADE CLIPPER SURG (BLADE) ×4 IMPLANT
CATH FOLEY 2WAY SLVR  5CC 16FR (CATHETERS)
CATH FOLEY 2WAY SLVR 5CC 16FR (CATHETERS) ×3 IMPLANT
CATH ROBINSON RED A/P 16FR (CATHETERS) ×1 IMPLANT
CATH ROBINSON RED A/P 20FR (CATHETERS) ×4 IMPLANT
CLOTH BEACON ORANGE TIMEOUT ST (SAFETY) ×4 IMPLANT
CONT SPECI 4OZ STER CLIK (MISCELLANEOUS) ×8 IMPLANT
COVER BACK TABLE 60X90IN (DRAPES) ×4 IMPLANT
COVER MAYO STAND STRL (DRAPES) ×4 IMPLANT
COVER WAND RF STERILE (DRAPES) ×4 IMPLANT
DRSG TEGADERM 4X4.75 (GAUZE/BANDAGES/DRESSINGS) ×7 IMPLANT
DRSG TEGADERM 8X12 (GAUZE/BANDAGES/DRESSINGS) ×7 IMPLANT
GAUZE SPONGE 4X4 12PLY STRL (GAUZE/BANDAGES/DRESSINGS) ×1 IMPLANT
GLOVE BIO SURGEON STRL SZ 6 (GLOVE) IMPLANT
GLOVE BIO SURGEON STRL SZ 6.5 (GLOVE) ×1 IMPLANT
GLOVE BIO SURGEON STRL SZ7 (GLOVE) ×1 IMPLANT
GLOVE BIO SURGEON STRL SZ7.5 (GLOVE) ×4 IMPLANT
GLOVE BIO SURGEON STRL SZ8 (GLOVE) IMPLANT
GLOVE BIOGEL PI IND STRL 6 (GLOVE) IMPLANT
GLOVE BIOGEL PI IND STRL 6.5 (GLOVE) IMPLANT
GLOVE BIOGEL PI IND STRL 7.0 (GLOVE) IMPLANT
GLOVE BIOGEL PI IND STRL 8 (GLOVE) IMPLANT
GLOVE BIOGEL PI INDICATOR 6 (GLOVE)
GLOVE BIOGEL PI INDICATOR 6.5 (GLOVE)
GLOVE BIOGEL PI INDICATOR 7.0 (GLOVE) ×1
GLOVE BIOGEL PI INDICATOR 8 (GLOVE)
GLOVE ECLIPSE 8.0 STRL XLNG CF (GLOVE) ×4 IMPLANT
GOWN STRL REUS W/TWL LRG LVL3 (GOWN DISPOSABLE) ×5 IMPLANT
GOWN STRL REUS W/TWL XL LVL3 (GOWN DISPOSABLE) ×4 IMPLANT
HOLDER FOLEY CATH W/STRAP (MISCELLANEOUS) IMPLANT
I-Seed AgX100 ×80 IMPLANT
IMPL SPACEOAR SYSTEM 10ML (Spacer) ×3 IMPLANT
IMPLANT SPACEOAR SYSTEM 10ML (Spacer) ×4 IMPLANT
IV SOD CHL 0.9% 1000ML (IV SOLUTION) ×4 IMPLANT
KIT TURNOVER CYSTO (KITS) ×4 IMPLANT
MARKER SKIN DUAL TIP RULER LAB (MISCELLANEOUS) ×4 IMPLANT
PACK CYSTO (CUSTOM PROCEDURE TRAY) ×4 IMPLANT
SURGILUBE 2OZ TUBE FLIPTOP (MISCELLANEOUS) ×4 IMPLANT
SUT BONE WAX W31G (SUTURE) IMPLANT
SYR 10ML LL (SYRINGE) ×5 IMPLANT
TRAY FOLEY BAG SILVER LF 16FR (CATHETERS) ×1 IMPLANT
UNDERPAD 30X30 (UNDERPADS AND DIAPERS) ×8 IMPLANT
WATER STERILE IRR 500ML POUR (IV SOLUTION) ×4 IMPLANT

## 2018-11-09 NOTE — Transfer of Care (Signed)
Last Vitals:  Vitals Value Taken Time  BP 132/90 11/09/2018  2:34 PM  Temp    Pulse 98 11/09/2018  2:36 PM  Resp 14 11/09/2018  2:36 PM  SpO2 100 % 11/09/2018  2:36 PM  Vitals shown include unvalidated device data.  Last Pain:  Vitals:   11/09/18 1102  TempSrc: Oral      Patients Stated Pain Goal: 5 (11/09/18 1122)  Immediate Anesthesia Transfer of Care Note  Patient: Chad Turner  Procedure(s) Performed: Procedure(s) (LRB): RADIOACTIVE SEED IMPLANT/BRACHYTHERAPY IMPLANT (N/A) SPACE OAR INSTILLATION (N/A) CYSTOSCOPY FLEXIBLE (N/A)  Patient Location: PACU  Anesthesia Type: General  Level of Consciousness: awake, alert  and oriented  Airway & Oxygen Therapy: Patient Spontanous Breathing and Patient connected to nasal cannula oxygen  Post-op Assessment: Report given to PACU RN and Post -op Vital signs reviewed and stable  Post vital signs: Reviewed and stable  Complications: No apparent anesthesia complications

## 2018-11-09 NOTE — Anesthesia Postprocedure Evaluation (Signed)
Anesthesia Post Note  Patient: Chad Turner  Procedure(s) Performed: RADIOACTIVE SEED IMPLANT/BRACHYTHERAPY IMPLANT (N/A Prostate) SPACE OAR INSTILLATION (N/A Rectum) CYSTOSCOPY FLEXIBLE (N/A Urethra)     Patient location during evaluation: PACU Anesthesia Type: General Level of consciousness: awake Pain management: pain level controlled Vital Signs Assessment: post-procedure vital signs reviewed and stable Respiratory status: spontaneous breathing Cardiovascular status: stable Postop Assessment: no apparent nausea or vomiting Anesthetic complications: no    Last Vitals:  Vitals:   11/09/18 1510 11/09/18 1515  BP:  120/79  Pulse: 97 (!) 102  Resp: 15 19  Temp:    SpO2: 97% 94%    Last Pain:  Vitals:   11/09/18 1515  TempSrc:   PainSc: 2                  Dayle Mcnerney
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
## 2018-11-09 NOTE — Anesthesia Preprocedure Evaluation (Addendum)
Anesthesia Evaluation  Patient identified by MRN, date of birth, ID band Patient awake    Reviewed: Allergy & Precautions, NPO status , Patient's Chart, lab work & pertinent test results  Airway Mallampati: III  TM Distance: >3 FB Neck ROM: Full    Dental  (+) Teeth Intact, Dental Advisory Given   Pulmonary    breath sounds clear to auscultation       Cardiovascular hypertension,  Rhythm:Regular Rate:Normal     Neuro/Psych    GI/Hepatic negative GI ROS, Neg liver ROS,   Endo/Other    Renal/GU      Musculoskeletal  (+) Arthritis ,   Abdominal   Peds  Hematology   Anesthesia Other Findings   Reproductive/Obstetrics                            Anesthesia Physical Anesthesia Plan  ASA: III  Anesthesia Plan: General   Post-op Pain Management:    Induction: Intravenous  PONV Risk Score and Plan: Ondansetron and Dexamethasone  Airway Management Planned: LMA  Additional Equipment:   Intra-op Plan:   Post-operative Plan:   Informed Consent: I have reviewed the patients History and Physical, chart, labs and discussed the procedure including the risks, benefits and alternatives for the proposed anesthesia with the patient or authorized representative who has indicated his/her understanding and acceptance.   Dental advisory given  Plan Discussed with: CRNA and Anesthesiologist  Anesthesia Plan Comments:         Anesthesia Quick Evaluation

## 2018-11-09 NOTE — Op Note (Signed)
PATIENT:  Chad Turner  PRE-OPERATIVE DIAGNOSIS:  Adenocarcinoma of the prostate  POST-OPERATIVE DIAGNOSIS:  Same  PROCEDURE:  1. I-125 radioactive seed implantation 2. Cystoscopy  3. Placement of SpaceOAR  SURGEON:  Ellison Hughs, MD  Radiation oncologist: Tyler Pita, MD  ANESTHESIA:  General  EBL:  Minimal  DRAINS: None  INDICATION: [name] is a 58 year old male with a history of T1c Gleason 3+4 = 7 prostate cancer following biopsy on 07/26/2018.  He is here today for brachii therapy seed and space OAR placement.  Description of procedure: After informed consent the patient was brought to the major OR, placed on the table and administered general anesthesia. He was then moved to the modified lithotomy position with his perineum perpendicular to the floor. His perineum and genitalia were then sterilely prepped. An official timeout was then performed. A 16 French Foley catheter was then placed in the bladder and filled with dilute contrast, a rectal tube was placed in the rectum and the transrectal ultrasound probe was placed in the rectum and affixed to the stand. He was then sterilely draped.  Real time ultrasonography was used along with the seed planning software. This was used to develop the seed plan including the number of needles as well as number of seeds required for complete and adequate coverage. Real-time ultrasonography was then used along with the previously developed plan and the Nucletron device to implant a total of 80 seeds using 24 needles. This proceeded without difficulty or complication.   I then proceeded with placement of SpaceOAR by introducing a needle with the bevel angled inferiorly approximately 2 cm superior to the anus. This was angled downward and under direct ultrasound was placed within the space between the prostatic capsule and rectum. This was confirmed with a small amount of sterile saline injected and this was performed under  direct ultrasound. I then attached the SpaceOAR to the needle and injected this in the space between the prostate and rectum with good placement noted.  A Foley catheter was then removed as well as the transrectal ultrasound probe and rectal probe. Flexible cystoscopy was then performed using the 17 French flexible scope which revealed a normal urethra throughout its length down to the sphincter which appeared intact. The prostatic urethra revealed bilobar hypertrophy but no evidence of obstruction, seeds, spacers or lesions. The bladder was then entered and fully and systematically inspected. The ureteral orifices were noted to be of normal configuration and position. The mucosa revealed no evidence of tumors. There were also no stones identified within the bladder. I noted no seeds or spacers on the floor of the bladder and retroflexion of the scope revealed no seeds protruding from the base of the prostate.  The cystoscope was then removed and the patient was awakened and taken to recovery room in stable and satisfactory condition. He tolerated procedure well and there were no intraoperative complications.

## 2018-11-09 NOTE — Discharge Instructions (Signed)
Brachytherapy for Prostate Cancer, Care After ° °This sheet gives you information about how to care for yourself after your procedure. Your health care provider may also give you more specific instructions. If you have problems or questions, contact your health care provider. °What can I expect after the procedure? °After the procedure, it is common to have: °· Trouble passing urine. °· Blood in the urine or semen. °· Constipation. °· Frequent feeling of an urgent need to urinate. °· Bruising, swelling, and tenderness of the area behind the scrotum (perineum). °· Bloating and gas. °· Fatigue. °· Burning or pain in the rectum. °· Problems getting or keeping an erection (erectile dysfunction). °· Nausea. °Follow these instructions at home: °Managing pain, stiffness, and swelling °· If directed, apply ice to the affected area: °? Put ice in a plastic bag. °? Place a towel between your skin and the bag. °? Leave the ice on for 20 minutes, 2-3 times a day. °· Try not to sit directly on the area behind the scrotum. A soft cushion can help with discomfort. °Activity °· Do not drive for 24 hours if you were given a medicine to help you relax (sedative). °· Do not drive or use heavy machinery while taking prescription pain medicine. °· Rest as told by your health care provider. °· Most people can return to normal activities a few days or weeks after the procedure. Ask your health care provider what activities are safe for you. °Eating and drinking °· Drink enough fluid to keep your urine clear or pale yellow. °· Eat a healthy, balanced diet. This includes lean proteins, whole grains, and plenty of fruits and vegetables. °General instructions °· Take over-the-counter and prescription medicines only as told by your health care provider. °· Keep all follow-up visits as told by your health care provider. This is important. You may still need additional treatment. °· Do not take baths, swim, or use a hot tub until your health  care provider approves. Shower and wash the area behind the scrotum gently. °· Do not have sex for one week after the treatment, or until your health care provider approves. °· If you have permanent, low-dose brachytherapy implants: °? Limit close contact with children and pregnant women for 2 months or as told by your health care provider. This is important because of the radiation that is still active in the prostate. °? You may set off radioactive sensors, such as airport screenings. Ask your health care provider for a document that explains your treatment. °? You may be instructed to use a condom during sex for the first 2 months after low-dose brachytherapy. °Contact a health care provider if: °· You have a fever or chills. °· You do not have a bowel movement for 3-4 days after the procedure. °· You have diarrhea for 3-4 days after the procedure. °· You develop any new symptoms, such as problems with urinating or erectile dysfunction. °· You have abdomen (abdominal) pain. °· You have more blood in your urine. °Get help right away if: °· You cannot urinate. °· There is excessive bleeding from your rectum. °· You have unusual drainage coming from your rectum. °· You have severe pain in the treated area that does not go away with pain medicine. °· You have severe nausea or vomiting. °Summary °· If you have permanent, low-dose brachytherapy implants, limit close contact with children and pregnant women for 2 months or as told by your health care provider. This is important because of the radiation   that is still active in the prostate.  Talk with your health care provider about your risk of brachytherapy side effects, such as erectile dysfunction or urinary problems. Your health care provider will be able to recommend possible treatment options.  Keep all follow-up visits as told by your health care provider. This is important. You may need additional treatment. This information is not intended to replace  advice given to you by your health care provider. Make sure you discuss any questions you have with your health care provider. Document Released: 11/19/2010 Document Revised: 11/18/2016 Document Reviewed: 11/18/2016 Elsevier Interactive Patient Education  2019 [place] Anesthesia Home Care Instructions  Activity: Get plenty of rest for the remainder of the day. A responsible individual must stay with you for 24 hours following the procedure.  For the next 24 hours, DO NOT: -Drive a car -Paediatric nurse -Drink alcoholic beverages -Take any medication unless instructed by your physician -Make any legal decisions or sign important papers.  Meals: Start with liquid foods such as gelatin or soup. Progress to regular foods as tolerated. Avoid greasy, spicy, heavy foods. If nausea and/or vomiting occur, drink only clear liquids until the nausea and/or vomiting subsides. Call your physician if vomiting continues.  Special Instructions/Symptoms: Your throat may feel dry or sore from the anesthesia or the breathing tube placed in your throat during surgery. If this causes discomfort, gargle with warm salt water. The discomfort should disappear within 24 hours.

## 2018-11-09 NOTE — Anesthesia Procedure Notes (Signed)
Procedure Name: LMA Insertion Date/Time: 11/09/2018 1:06 PM Performed by: Belinda Block, MD Pre-anesthesia Checklist: Patient identified, Emergency Drugs available, Suction available and Patient being monitored Patient Re-evaluated:Patient Re-evaluated prior to induction Oxygen Delivery Method: Circle system utilized Preoxygenation: Pre-oxygenation with 100% oxygen Induction Type: IV induction Ventilation: Mask ventilation without difficulty LMA: LMA inserted LMA Size: 5.0 Number of attempts: 1 Airway Equipment and Method: Bite block Placement Confirmation: positive ETCO2 Tube secured with: Tape Dental Injury: Teeth and Oropharynx as per pre-operative assessment

## 2018-11-12 ENCOUNTER — Encounter (HOSPITAL_BASED_OUTPATIENT_CLINIC_OR_DEPARTMENT_OTHER): Payer: Self-pay | Admitting: Urology

## 2018-11-17 NOTE — Progress Notes (Signed)
  Radiation Oncology         [phone] ________________________________  Name: Chad Turner MRN: [id number]  Date: 11/17/2018  DOB: [birth year]       Prostate Seed Implant  PQ:[id number], [facility]  No ref. provider found  DIAGNOSIS: 58 year-old gentleman with Stage T1c adenocarcinoma of the prostate with Gleason Score of 3+4, and PSA of 7.2.    ICD-10-CM   1. Prostate cancer ([place]) C61 DG Chest 2 View    DG Chest 2 View    PROCEDURE: Insertion of radioactive I-125 seeds into the prostate gland.  RADIATION DOSE: 145 Gy, definitive therapy.  TECHNIQUE: [name] was brought to the operating room with the urologist. He was placed in the dorsolithotomy position. He was catheterized and a rectal tube was inserted. The perineum was shaved, prepped and draped. The ultrasound probe was then introduced into the rectum to see the prostate gland.  TREATMENT DEVICE: A needle grid was attached to the ultrasound probe stand and anchor needles were placed.  3D PLANNING: The prostate was imaged in 3D using a sagittal sweep of the prostate probe. These images were transferred to the planning computer. There, the prostate, urethra and rectum were defined on each axial reconstructed image. Then, the software created an optimized 3D plan and a few seed positions were adjusted. The quality of the plan was reviewed using [facility] information for the target and the following two organs at risk:  Urethra and Rectum.  Then the accepted plan was printed and handed off to the radiation therapist.  Under my supervision, the custom loading of the seeds and spacers was carried out and loaded into sealed vicryl sleeves.  These pre-loaded needles were then placed into the needle holder.Marland Kitchen  PROSTATE VOLUME STUDY:  Using transrectal ultrasound the volume of the prostate was verified to be 36 cc.  SPECIAL TREATMENT PROCEDURE/SUPERVISION AND HANDLING: The pre-loaded needles were then delivered under  sagittal guidance. A total of 24 needles were used to deposit 80 seeds in the prostate gland. The individual seed activity was 0.378 mCi.  SpaceOAR:  Yes  COMPLEX SIMULATION: At the end of the procedure, an anterior radiograph of the pelvis was obtained to document seed positioning and count. Cystoscopy was performed to check the urethra and bladder.  MICRODOSIMETRY: At the end of the procedure, the patient was emitting 0.083 mR/hr at 1 meter. Accordingly, he was considered safe for hospital discharge.  PLAN: The patient will return to the radiation oncology clinic for post implant CT dosimetry in three weeks.   ________________________________  Sheral Apley Tammi Klippel, M.D.

## 2018-11-21 ENCOUNTER — Telehealth: Payer: Self-pay | Admitting: *Deleted

## 2018-11-21 NOTE — Telephone Encounter (Signed)
CALLED PATIENT TO REMIND OF POST SEED APPTS. FOR 11-22-18, SPOKE WITH PATIENT AND HE IS AWARE OF THESE APPTS.

## 2018-11-22 ENCOUNTER — Ambulatory Visit
Admission: RE | Admit: 2018-11-22 | Discharge: 2018-11-22 | Disposition: A | Discharge status: Home / Self Care | Payer: Medicare HMO | Source: Ambulatory Visit | Attending: Radiation Oncology | Admitting: Radiation Oncology

## 2018-11-22 ENCOUNTER — Encounter: Payer: Self-pay | Admitting: Radiation Oncology

## 2018-11-22 ENCOUNTER — Encounter: Payer: Self-pay | Admitting: Urology

## 2018-11-22 ENCOUNTER — Ambulatory Visit
Admission: RE | Admit: 2018-11-22 | Discharge: 2018-11-22 | Disposition: A | Discharge status: Home / Self Care | Payer: Medicare HMO | Source: Ambulatory Visit | Attending: Urology | Admitting: Urology

## 2018-11-22 ENCOUNTER — Encounter: Payer: Self-pay | Admitting: Medical Oncology

## 2018-11-22 ENCOUNTER — Other Ambulatory Visit: Payer: Self-pay

## 2018-11-22 VITALS — BP 133/83 | HR 87 | Temp 98.4°F | Resp 20 | Wt 205.4 lb

## 2018-11-22 DIAGNOSIS — Z923 Personal history of irradiation: Secondary | ICD-10-CM | POA: Diagnosis not present

## 2018-11-22 DIAGNOSIS — R3911 Hesitancy of micturition: Secondary | ICD-10-CM | POA: Insufficient documentation

## 2018-11-22 DIAGNOSIS — R350 Frequency of micturition: Secondary | ICD-10-CM | POA: Insufficient documentation

## 2018-11-22 DIAGNOSIS — C61 Malignant neoplasm of prostate: Secondary | ICD-10-CM | POA: Diagnosis not present

## 2018-11-22 DIAGNOSIS — Z79899 Other long term (current) drug therapy: Secondary | ICD-10-CM | POA: Diagnosis not present

## 2018-11-22 NOTE — Progress Notes (Signed)
Radiation Oncology         [phone] ________________________________  Name: Chad Turner MRN: [id number]  Date: 11/22/2018  DOB: [birth year]  Post-Seed Follow-Up Visit Note  CC: [facility]  Diagnosis:   58 year-old gentleman with Stage T1c adenocarcinoma of the prostate with Gleason Score of 3+4, and PSA of 7.2.    ICD-10-CM   1. Malignant neoplasm of prostate (HCC) C61     Interval Since Last Radiation:  2 weeks 11/09/18:  Insertion of radioactive I-125 seeds into the prostate gland; 145 Gy, definitive therapy with SpaceOAR gel placement.  Narrative:  The patient returns today for routine follow-up.  He is complaining of increased urinary frequency and urinary hesitation symptoms. He filled out a questionnaire regarding urinary function today providing and overall IPSS score of 10 characterizing his symptoms as moderate with mild increased daytime frequency, urgency and nocturia x3-4 per night.  His pre-implant score was 8.  He specifically denies dysuria, gross hematuria, weak stream, straining to void, incomplete emptying or incontinence.  He denies abdominal pain, nausea, vomiting, diarrhea or constipation.  He reports a healthy appetite and is maintaining his weight.  Overall, he is quite pleased with his progress to date.  ALLERGIES:  is allergic to penicillins.  Meds: Current Outpatient Medications  Medication Sig Dispense Refill  . amLODipine (NORVASC) 10 MG tablet Take 10 mg by mouth every morning.     . Ascorbic Acid (VITAMIN C PO) Take 1 tablet by mouth daily.    . hydrochlorothiazide (MICROZIDE) 12.5 MG capsule Take 12.5 mg by mouth every morning.   3  . ondansetron (ZOFRAN ODT) 8 MG disintegrating tablet Take 1 tablet (8 mg total) by mouth every 8 (eight) hours as needed for nausea or vomiting. 10 tablet 0  . pravastatin (PRAVACHOL) 20 MG tablet Take 20 mg by mouth every morning.   3   No current  facility-administered medications for this visit.     Physical Findings: In general this is a well appearing African-American male in no acute distress.  He's alert and oriented x4 and appropriate throughout the examination. Cardiopulmonary assessment is negative for acute distress and he exhibits normal effort.   Lab Findings: Lab Results  Component Value Date   WBC 6.7 11/01/2018   HGB 13.7 11/01/2018   HCT 41.4 11/01/2018   MCV 88.5 11/01/2018   PLT 325 11/01/2018    Radiographic Findings:  Patient underwent CT imaging in our clinic for post implant dosimetry. The CT will be reviewed by Dr. Tammi Klippel to confirm an adequate distribution of radioactive seeds throughout the prostate gland and ensure that there are no seeds in or near the rectum.  Pending insurance approval, he will be scheduled for an MRI of the prostate and these images will be fused with his CT images for further evaluation.  We suspect the final radiation plan and dosimetry will show appropriate coverage of the prostate gland and we will plan to contact the patient should there be any findings to indicate otherwise.    Impression/Plan: 58 year-old gentleman with Stage T1c adenocarcinoma of the prostate with Gleason Score of 3+4, and PSA of 7.2. The patient is recovering from the effects of radiation. His urinary symptoms should gradually improve over the next 4-6 months. We talked about this today. He is encouraged by his improvement already and is otherwise pleased with his outcome. We also talked about long-term follow-up for prostate cancer following seed implant.  He understands that ongoing PSA determinations and digital rectal exams will help perform surveillance to rule out disease recurrence. He has a follow up appointment scheduled with Dr. Lovena Neighbours on 12/21/2018. He understands what to expect with his PSA measures. Patient was also educated today about some of the long-term effects from radiation including a small risk  for rectal bleeding and possibly erectile dysfunction. We talked about some of the general management approaches to these potential complications. However, I did encourage the patient to contact our office or return at any point if he has questions or concerns related to his previous radiation and prostate cancer.    Nicholos Johns, PA-C

## 2018-11-22 NOTE — Progress Notes (Signed)
Chad Turner is a follow -up appointment. Patients MRI is pending approval . Patient states that he goes back to his urologist on February 21,2020. Patient denies  any dysuria. Patient states that he had some hematuria last week,denies any today. Patient denies any leakage.Patient states that he has constipation and diarrhea. Patient IpSS is  10 Vitals:   11/22/18 1423  BP: 133/83  Pulse: 87  Resp: 20  Temp: 98.4 F (36.9 C)  TempSrc: Oral  SpO2: 100%  Weight: 205 lb 6.4 oz (93.2 kg)   Wt Readings from Last 3 Encounters:  11/22/18 205 lb 6.4 oz (93.2 kg)  11/09/18 204 lb 14.4 oz (92.9 kg)  10/05/18 202 lb 3.2 oz (91.7 kg)

## 2018-11-23 ENCOUNTER — Telehealth: Payer: Self-pay | Admitting: *Deleted

## 2018-11-23 ENCOUNTER — Other Ambulatory Visit: Payer: Self-pay | Admitting: Urology

## 2018-11-23 MED ORDER — LORAZEPAM 1 MG PO TABS: 1.0000 mg | ORAL_TABLET | ORAL | 0 refills | Status: DC | PRN

## 2018-11-23 NOTE — Telephone Encounter (Signed)
CALLED PATIENT TO INFORM THAT SCRIPT IS READY FOR PICK-UP LATER TODAY, NO ANSWER WILL CALL LATER

## 2018-11-23 NOTE — Telephone Encounter (Signed)
Called patient to inform of MRI for 11-27-18 - arrival time - 3:30 pm - test to begin @ 4 pm @ [facility], no restrictions to test, spoke with patient and he is aware of this test

## 2018-11-24 NOTE — Progress Notes (Signed)
  Radiation Oncology         [phone] ________________________________  Name: Chad Turner MRN: [id number]  Date: 11/22/2018  DOB: 09/03/1961  COMPLEX SIMULATION NOTE  NARRATIVE:  The patient was brought to the [facility] today following prostate seed implantation approximately one month ago.  Identity was confirmed.  All relevant records and images related to the planned course of therapy were reviewed.  Then, the patient was set-up supine.  CT images were obtained.  The CT images were loaded into the planning software.  Then the prostate and rectum were contoured.  Treatment planning then occurred.  The implanted iodine 125 seeds were identified by the physics staff for projection of radiation distribution  I have requested : 3D Simulation  I have requested a DVH of the following structures: Prostate and rectum.    ________________________________  Sheral Apley Tammi Klippel, M.D.

## 2018-11-27 ENCOUNTER — Ambulatory Visit (HOSPITAL_COMMUNITY)
Admission: RE | Admit: 2018-11-27 | Discharge: 2018-11-27 | Disposition: A | Discharge status: Home / Self Care | Payer: Medicare HMO | Source: Ambulatory Visit | Attending: Urology | Admitting: Urology

## 2018-11-27 DIAGNOSIS — C61 Malignant neoplasm of prostate: Secondary | ICD-10-CM | POA: Diagnosis not present

## 2018-12-12 ENCOUNTER — Ambulatory Visit: Payer: Medicare HMO | Attending: Radiation Oncology | Admitting: Radiation Oncology

## 2018-12-12 ENCOUNTER — Encounter: Payer: Self-pay | Admitting: Radiation Oncology

## 2018-12-12 DIAGNOSIS — C61 Malignant neoplasm of prostate: Secondary | ICD-10-CM | POA: Diagnosis not present

## 2018-12-25 NOTE — Progress Notes (Addendum)
  Radiation Oncology         [phone] ________________________________  Name: Chad Turner MRN: [id number]  Date: 12/12/2018  DOB: 04-14-1961  3D Planning Note   Prostate Brachytherapy Post-Implant Dosimetry  Diagnosis: 58 year-old gentleman with Stage T1c adenocarcinoma of the prostate with Gleason Score of 3+4, and PSA of 7.2.   Narrative: On a previous date, Chad Turner returned following prostate seed implantation for post implant planning. He underwent CT scan complex simulation to delineate the three-dimensional structures of the pelvis and demonstrate the radiation distribution.  Since that time, the seed localization, and complex isodose planning with dose volume histograms have now been completed.  Results:   Prostate Coverage - The dose of radiation delivered to the 90% or more of the prostate gland (D90) was 76.59% of the prescription dose. This falls short of our goal of greater than 90%. Rectal Sparing - The volume of rectal tissue receiving the prescription dose or higher was 0.0 cc. This falls under our thresholds tolerance of 1.0 cc.  Impression: The prostate seed implant appears to show adequate target coverage and appropriate rectal sparing.  Plan:  The patient will continue to follow with urology for ongoing PSA determinations. I am hopeful for a high likelihood for local tumor control with minimal risk for rectal morbidity.  ________________________________  Sheral Apley Tammi Klippel, M.D.

## 2019-04-18 ENCOUNTER — Encounter: Payer: Self-pay | Admitting: *Deleted

## 2019-06-17 ENCOUNTER — Encounter (HOSPITAL_COMMUNITY): Payer: Self-pay | Admitting: Emergency Medicine

## 2019-06-17 ENCOUNTER — Other Ambulatory Visit: Payer: Self-pay

## 2019-06-17 ENCOUNTER — Ambulatory Visit (HOSPITAL_COMMUNITY)
Admission: EM | Admit: 2019-06-17 | Discharge: 2019-06-17 | Disposition: A | Discharge status: Home / Self Care | Payer: Medicare Other | Attending: Family Medicine | Admitting: Family Medicine

## 2019-06-17 DIAGNOSIS — Z833 Family history of diabetes mellitus: Secondary | ICD-10-CM | POA: Insufficient documentation

## 2019-06-17 DIAGNOSIS — F10230 Alcohol dependence with withdrawal, uncomplicated: Secondary | ICD-10-CM | POA: Diagnosis not present

## 2019-06-17 DIAGNOSIS — Z8546 Personal history of malignant neoplasm of prostate: Secondary | ICD-10-CM | POA: Diagnosis not present

## 2019-06-17 DIAGNOSIS — I10 Essential (primary) hypertension: Secondary | ICD-10-CM | POA: Diagnosis not present

## 2019-06-17 DIAGNOSIS — R197 Diarrhea, unspecified: Secondary | ICD-10-CM

## 2019-06-17 DIAGNOSIS — Z880 Allergy status to penicillin: Secondary | ICD-10-CM | POA: Diagnosis not present

## 2019-06-17 DIAGNOSIS — F329 Major depressive disorder, single episode, unspecified: Secondary | ICD-10-CM | POA: Insufficient documentation

## 2019-06-17 DIAGNOSIS — R112 Nausea with vomiting, unspecified: Secondary | ICD-10-CM | POA: Diagnosis present

## 2019-06-17 DIAGNOSIS — Z79899 Other long term (current) drug therapy: Secondary | ICD-10-CM | POA: Insufficient documentation

## 2019-06-17 DIAGNOSIS — F1994 Other psychoactive substance use, unspecified with psychoactive substance-induced mood disorder: Secondary | ICD-10-CM | POA: Insufficient documentation

## 2019-06-17 DIAGNOSIS — Z20828 Contact with and (suspected) exposure to other viral communicable diseases: Secondary | ICD-10-CM | POA: Insufficient documentation

## 2019-06-17 DIAGNOSIS — Z96652 Presence of left artificial knee joint: Secondary | ICD-10-CM | POA: Insufficient documentation

## 2019-06-17 LAB — COMPREHENSIVE METABOLIC PANEL
ALT: 105 U/L — ABNORMAL HIGH (ref 0–44)
AST: 134 U/L — ABNORMAL HIGH (ref 15–41)
Albumin: 3.8 g/dL (ref 3.5–5.0)
Alkaline Phosphatase: 160 U/L — ABNORMAL HIGH (ref 38–126)
Anion gap: 14 (ref 5–15)
BUN: <5 mg/dL — ABNORMAL LOW (ref 6–20)
CO2: 25 mmol/L (ref 22–32)
Calcium: 9.4 mg/dL (ref 8.9–10.3)
Chloride: 91 mmol/L — ABNORMAL LOW (ref 98–111)
Creatinine, Ser: 0.69 mg/dL (ref 0.61–1.24)
GFR calc Af Amer: >60 mL/min (ref >60)
GFR calc non Af Amer: >60 mL/min (ref >60)
Glucose, Bld: 131 mg/dL — ABNORMAL HIGH (ref 70–99)
Potassium: 3.6 mmol/L (ref 3.5–5.1)
Sodium: 130 mmol/L — ABNORMAL LOW (ref 135–145)
Total Bilirubin: 2.4 mg/dL — ABNORMAL HIGH (ref 0.3–1.2)
Total Protein: 7.9 g/dL (ref 6.5–8.1)

## 2019-06-17 LAB — CBC
HCT: 41.5 % (ref 39.0–52.0)
Hemoglobin: 14.1 g/dL (ref 13.0–17.0)
MCH: 29 pg (ref 26.0–34.0)
MCHC: 34 g/dL (ref 30.0–36.0)
MCV: 85.2 fL (ref 80.0–100.0)
Platelets: 260 10*3/uL (ref 150–400)
RBC: 4.87 MIL/uL (ref 4.22–5.81)
RDW: 12.8 % (ref 11.5–15.5)
WBC: 7.6 10*3/uL (ref 4.0–10.5)
nRBC: 0 % (ref 0.0–0.2)

## 2019-06-17 LAB — LIPASE, BLOOD: Lipase: 29 U/L (ref 11–51)

## 2019-06-17 MED ORDER — ONDANSETRON 4 MG PO TBDP
ORAL_TABLET | ORAL | Status: AC
  Filled 2019-06-17: qty 1

## 2019-06-17 MED ORDER — ONDANSETRON 4 MG PO TBDP
4.0000 mg | ORAL_TABLET | Freq: Once | ORAL | Status: AC
  Administered 2019-06-17: 4 mg via ORAL

## 2019-06-17 MED ORDER — ONDANSETRON 4 MG PO TBDP: 4.0000 mg | ORAL_TABLET | Freq: Three times a day (TID) | ORAL | 0 refills | Status: DC | PRN

## 2019-06-17 MED ORDER — OMEPRAZOLE 20 MG PO CPDR: 20.0000 mg | DELAYED_RELEASE_CAPSULE | Freq: Every day | ORAL | 0 refills | Status: DC
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
## 2019-06-17 NOTE — ED Triage Notes (Signed)
Pt presents to [facility] for assessment of upset stomach with nausea and diarrhea since 2 days ago.  Denies abdominal pain.  Feels malaise.

## 2019-06-17 NOTE — Discharge Instructions (Signed)
Daily omeprazole.  Zofran every 8 hours as needed for nausea or vomiting.   Bland diet as tolerated.  Small frequent sips of fluids- Pedialyte, Gatorade, water, broth- to maintain hydration.   Limit food intake until symptoms have improved, advance as tolerated.  Will notify you of any positive findings from your lab tests and if any changes to treatment are needed.   Any worsening of symptoms please return or go to the ER: blood in vomit or stool, dizziness, weakness, dehydration

## 2019-06-19 ENCOUNTER — Telehealth (HOSPITAL_COMMUNITY): Payer: Self-pay | Admitting: Emergency Medicine

## 2019-06-19 LAB — NOVEL CORONAVIRUS, NAA (HOSP ORDER, SEND-OUT TO REF LAB; TAT 18-24 HRS): SARS-CoV-2, NAA: NOT DETECTED

## 2019-06-19 NOTE — Telephone Encounter (Signed)
Patient contacted and made aware of  lab  results, all questions answered

## 2019-07-17 ENCOUNTER — Other Ambulatory Visit: Payer: Medicare Other

## 2019-07-17 ENCOUNTER — Other Ambulatory Visit: Payer: Self-pay

## 2019-07-17 ENCOUNTER — Ambulatory Visit (INDEPENDENT_AMBULATORY_CARE_PROVIDER_SITE_OTHER): Payer: Medicare Other | Admitting: Physician Assistant

## 2019-07-17 ENCOUNTER — Encounter: Payer: Self-pay | Admitting: Physician Assistant

## 2019-07-17 VITALS — BP 142/90 | HR 102 | Temp 98.8°F | Ht 69.0 in | Wt 210.0 lb

## 2019-07-17 DIAGNOSIS — R197 Diarrhea, unspecified: Secondary | ICD-10-CM

## 2019-07-17 MED ORDER — LOPERAMIDE HCL 2 MG PO CAPS: 2.0000 mg | ORAL_CAPSULE | Freq: Four times a day (QID) | ORAL | 2 refills | Status: DC | PRN
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
## 2019-07-17 NOTE — Patient Instructions (Signed)
If you are age 60 or older, your body mass index should be between 23-30. Your Body mass index is 31.01 kg/m. If this is out of the aforementioned range listed, please consider follow up with your Primary Care Provider.  If you are age 72 or younger, your body mass index should be between 19-25. Your Body mass index is 31.01 kg/m. If this is out of the aformentioned range listed, please consider follow up with your Primary Care Provider.   We have sent the following medications to your pharmacy for you to pick up at your convenience: Loperamide   Your provider has requested that you go to the basement level for lab work before leaving today. Press "B" on the elevator. The lab is located at the first door on the left as you exit the elevator.  Thank you for choosing me and [facility].  Chad Turner

## 2019-07-22 ENCOUNTER — Telehealth: Payer: Self-pay

## 2019-07-22 NOTE — Telephone Encounter (Signed)
Patient called and stated he is bring in a stool sample tomorrow and wanted to also schedule a "scope procedure"  I told the patient Shelah Lewandowsky PA will want to get the results of the stool test first.

## 2019-07-23 ENCOUNTER — Other Ambulatory Visit: Payer: Medicare Other

## 2019-07-23 DIAGNOSIS — R197 Diarrhea, unspecified: Secondary | ICD-10-CM

## 2019-07-25 ENCOUNTER — Other Ambulatory Visit: Payer: Self-pay

## 2019-07-25 MED ORDER — VANCOMYCIN HCL 125 MG PO CAPS: 125.0000 mg | ORAL_CAPSULE | Freq: Four times a day (QID) | ORAL | 0 refills | Status: DC

## 2019-07-30 LAB — OVA AND PARASITE EXAMINATION
CONCENTRATE RESULT:: NONE SEEN
MICRO NUMBER:: 909568
SPECIMEN QUALITY:: ADEQUATE
TRICHROME RESULT:: NONE SEEN

## 2019-07-30 LAB — GASTROINTESTINAL PATHOGEN PANEL PCR
C. difficile Tox A/B, PCR: NOT DETECTED
Campylobacter, PCR: NOT DETECTED
Cryptosporidium, PCR: NOT DETECTED
E coli (ETEC) LT/ST PCR: NOT DETECTED
E coli (STEC) stx1/stx2, PCR: NOT DETECTED
E coli 0157, PCR: NOT DETECTED
Giardia lamblia, PCR: NOT DETECTED
Norovirus, PCR: NOT DETECTED
Rotavirus A, PCR: NOT DETECTED
Salmonella, PCR: NOT DETECTED
Shigella, PCR: NOT DETECTED

## 2019-07-30 LAB — CLOSTRIDIUM DIFFICILE TOXIN B, QUALITATIVE, REAL-TIME PCR: Toxigenic C. Difficile by PCR: DETECTED — AB

## 2019-07-31 NOTE — Progress Notes (Signed)
Addendum: Reviewed and agree with assessment and management plan. Lorine Iannaccone M, MD  

## 2019-08-11 ENCOUNTER — Encounter (HOSPITAL_COMMUNITY): Payer: Self-pay

## 2019-08-11 ENCOUNTER — Ambulatory Visit (HOSPITAL_COMMUNITY)
Admission: EM | Admit: 2019-08-11 | Discharge: 2019-08-11 | Disposition: A | Discharge status: Home / Self Care | Payer: Medicare Other | Attending: Family Medicine | Admitting: Family Medicine

## 2019-08-11 DIAGNOSIS — M25511 Pain in right shoulder: Secondary | ICD-10-CM

## 2019-08-11 DIAGNOSIS — S161XXA Strain of muscle, fascia and tendon at neck level, initial encounter: Secondary | ICD-10-CM

## 2019-08-11 MED ORDER — TIZANIDINE HCL 4 MG PO TABS: 4.0000 mg | ORAL_TABLET | Freq: Four times a day (QID) | ORAL | 0 refills | Status: DC | PRN

## 2019-08-11 MED ORDER — HYDROCODONE-ACETAMINOPHEN 7.5-325 MG PO TABS: 1.0000 | ORAL_TABLET | Freq: Four times a day (QID) | ORAL | 0 refills | Status: DC | PRN

## 2019-08-11 NOTE — Discharge Instructions (Addendum)
Use ice or heat to painful muscles Activity as tolerated Take ibuprofen 3 times a day with food Take tizanidine as needed as muscle relaxer Take pain medication when pain is severe.  Try to take only at nighttime.  Do not drive on pain medication.  Do not drink alcohol and pain medication. Call your orthopedist if not better by next week
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
## 2019-08-11 NOTE — ED Triage Notes (Signed)
Pt reports left shoulder pain x 2 days. Pt took Ibuprofen 800 mg did not help with the pain.

## 2019-08-17 ENCOUNTER — Other Ambulatory Visit: Payer: Self-pay

## 2019-08-17 ENCOUNTER — Ambulatory Visit (HOSPITAL_COMMUNITY)
Admission: EM | Admit: 2019-08-17 | Discharge: 2019-08-17 | Disposition: A | Discharge status: Home / Self Care | Payer: Medicare Other | Attending: Family Medicine | Admitting: Family Medicine

## 2019-08-17 ENCOUNTER — Encounter (HOSPITAL_COMMUNITY): Payer: Self-pay

## 2019-08-17 DIAGNOSIS — K130 Diseases of lips: Secondary | ICD-10-CM

## 2019-08-17 MED ORDER — DOXYCYCLINE HYCLATE 100 MG PO CAPS: 100.0000 mg | ORAL_CAPSULE | Freq: Two times a day (BID) | ORAL | 0 refills | Status: DC

## 2019-08-17 MED ORDER — MUPIROCIN 2 % EX OINT: 1.0000 "application " | TOPICAL_OINTMENT | Freq: Two times a day (BID) | CUTANEOUS | 0 refills | Status: DC

## 2019-08-17 NOTE — ED Triage Notes (Signed)
Pt states he has pain in his upper lip. Pt states she head a black head on his lip and popped it. X 4 days

## 2019-08-17 NOTE — Discharge Instructions (Signed)
Please begin taking doxycycline twice daily for the next 10 days. You may also apply Bactroban twice daily to this area. Apply warm compresses.  Please follow-up if swelling and pain not resolving with the above
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
## 2019-08-20 ENCOUNTER — Ambulatory Visit (HOSPITAL_COMMUNITY)
Admission: EM | Admit: 2019-08-20 | Discharge: 2019-08-20 | Disposition: A | Discharge status: Home / Self Care | Payer: Medicare Other | Attending: Family Medicine | Admitting: Family Medicine

## 2019-08-20 ENCOUNTER — Encounter (HOSPITAL_COMMUNITY): Payer: Self-pay

## 2019-08-20 ENCOUNTER — Other Ambulatory Visit: Payer: Self-pay

## 2019-08-20 DIAGNOSIS — R112 Nausea with vomiting, unspecified: Secondary | ICD-10-CM

## 2019-08-20 DIAGNOSIS — K130 Diseases of lips: Secondary | ICD-10-CM

## 2019-08-20 DIAGNOSIS — T887XXA Unspecified adverse effect of drug or medicament, initial encounter: Secondary | ICD-10-CM

## 2019-08-20 DIAGNOSIS — R197 Diarrhea, unspecified: Secondary | ICD-10-CM | POA: Diagnosis not present

## 2019-08-20 MED ORDER — SULFAMETHOXAZOLE-TRIMETHOPRIM 800-160 MG PO TABS: 1.0000 | ORAL_TABLET | Freq: Two times a day (BID) | ORAL | 0 refills | Status: AC

## 2019-08-20 MED ORDER — ONDANSETRON 4 MG PO TBDP: 4.0000 mg | ORAL_TABLET | Freq: Three times a day (TID) | ORAL | 0 refills | Status: DC | PRN
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
## 2019-08-20 NOTE — Discharge Instructions (Signed)
Stop taking doxycycline. I have sent a different antibiotic to begin. I have also sent a medication to help with any nausea you may have.  Please do your best to ensure adequate fluid intake in order to avoid dehydration. If you find that you are unable to tolerate drinking fluids regularly please proceed to the Emergency Department for evaluation.  Also, you should return to the hospital if you experience persistent fevers for greater than 1-2 more days, increasing abdominal pain that persists despite medications, persistent diarrhea, dizziness, syncope (fainting), or for any other concerns you may deem worrisome.

## 2019-08-20 NOTE — ED Triage Notes (Signed)
Pt presents with vomiting and diarrhea since starting doxycycline antibiotics on Sunday for infection.

## 2019-09-30 ENCOUNTER — Emergency Department (HOSPITAL_COMMUNITY): Payer: Medicare Other

## 2019-09-30 ENCOUNTER — Emergency Department (HOSPITAL_COMMUNITY)
Admission: EM | Admit: 2019-09-30 | Discharge: 2019-09-30 | Disposition: A | Discharge status: Home / Self Care | Payer: Medicare Other | Source: Home / Self Care | Attending: Emergency Medicine | Admitting: Emergency Medicine

## 2019-09-30 ENCOUNTER — Other Ambulatory Visit: Payer: Self-pay

## 2019-09-30 DIAGNOSIS — D649 Anemia, unspecified: Secondary | ICD-10-CM | POA: Insufficient documentation

## 2019-09-30 DIAGNOSIS — Z79899 Other long term (current) drug therapy: Secondary | ICD-10-CM | POA: Insufficient documentation

## 2019-09-30 DIAGNOSIS — I10 Essential (primary) hypertension: Secondary | ICD-10-CM | POA: Insufficient documentation

## 2019-09-30 DIAGNOSIS — E871 Hypo-osmolality and hyponatremia: Secondary | ICD-10-CM | POA: Diagnosis not present

## 2019-09-30 DIAGNOSIS — R072 Precordial pain: Secondary | ICD-10-CM

## 2019-09-30 DIAGNOSIS — K7110 Toxic liver disease with hepatic necrosis, without coma: Secondary | ICD-10-CM | POA: Diagnosis not present

## 2019-09-30 LAB — BASIC METABOLIC PANEL
Anion gap: 10 (ref 5–15)
BUN: <5 mg/dL — ABNORMAL LOW (ref 6–20)
CO2: 22 mmol/L (ref 22–32)
Calcium: 8.3 mg/dL — ABNORMAL LOW (ref 8.9–10.3)
Chloride: 101 mmol/L (ref 98–111)
Creatinine, Ser: 0.66 mg/dL (ref 0.61–1.24)
GFR calc Af Amer: >60 mL/min (ref >60)
GFR calc non Af Amer: >60 mL/min (ref >60)
Glucose, Bld: 99 mg/dL (ref 70–99)
Potassium: 3.7 mmol/L (ref 3.5–5.1)
Sodium: 133 mmol/L — ABNORMAL LOW (ref 135–145)

## 2019-09-30 LAB — CBC
HCT: 33.1 % — ABNORMAL LOW (ref 39.0–52.0)
Hemoglobin: 10.2 g/dL — ABNORMAL LOW (ref 13.0–17.0)
MCH: 26.4 pg (ref 26.0–34.0)
MCHC: 30.8 g/dL (ref 30.0–36.0)
MCV: 85.8 fL (ref 80.0–100.0)
Platelets: 476 10*3/uL — ABNORMAL HIGH (ref 150–400)
RBC: 3.86 MIL/uL — ABNORMAL LOW (ref 4.22–5.81)
RDW: 15.5 % (ref 11.5–15.5)
WBC: 12 10*3/uL — ABNORMAL HIGH (ref 4.0–10.5)
nRBC: 0 % (ref 0.0–0.2)

## 2019-09-30 LAB — TROPONIN I (HIGH SENSITIVITY)
Troponin I (High Sensitivity): 9 ng/L (ref <18)
Troponin I (High Sensitivity): 10 ng/L (ref <18)

## 2019-09-30 LAB — HEPATIC FUNCTION PANEL
ALT: 106 U/L — ABNORMAL HIGH (ref 0–44)
AST: 294 U/L — ABNORMAL HIGH (ref 15–41)
Albumin: 3 g/dL — ABNORMAL LOW (ref 3.5–5.0)
Alkaline Phosphatase: 184 U/L — ABNORMAL HIGH (ref 38–126)
Bilirubin, Direct: 0.9 mg/dL — ABNORMAL HIGH (ref 0.0–0.2)
Indirect Bilirubin: 1.3 mg/dL — ABNORMAL HIGH (ref 0.3–0.9)
Total Bilirubin: 2.2 mg/dL — ABNORMAL HIGH (ref 0.3–1.2)
Total Protein: 7.6 g/dL (ref 6.5–8.1)

## 2019-09-30 LAB — LIPASE, BLOOD: Lipase: 22 U/L (ref 11–51)

## 2019-09-30 LAB — D-DIMER, QUANTITATIVE: D-Dimer, Quant: 3.78 ug/mL-FEU — ABNORMAL HIGH (ref 0.00–0.50)

## 2019-09-30 MED ORDER — SODIUM CHLORIDE 0.9% FLUSH
3.0000 mL | Freq: Once | INTRAVENOUS | Status: AC
  Administered 2019-09-30: 3 mL via INTRAVENOUS

## 2019-09-30 MED ORDER — IOHEXOL 350 MG/ML SOLN
75.0000 mL | Freq: Once | INTRAVENOUS | Status: AC | PRN
  Administered 2019-09-30: 75 mL via INTRAVENOUS

## 2019-09-30 NOTE — Discharge Instructions (Signed)
Your work-up today was concerning for several things including slightly worsened liver function and your hemoglobin was lower than prior.  Your heart enzymes were negative both times we checked them and your work-up was otherwise reassuring.  Based on your stability and lack of new symptoms for over 8 hours, we had a shared decision-making conversation and agreed you are safe to go home to follow-up with cardiology and your primary doctor.  If any symptoms change or worsen, please return to the nearest emergency department immediately.

## 2019-09-30 NOTE — ED Triage Notes (Signed)
Pt reports intermittent left sided CP, lasting 11min per episode. Pt reports vomiting, weakness, left arm pain and numbness. Denies hx of heart attacks.  VSS.

## 2019-09-30 NOTE — ED Notes (Signed)
Pt dc'd home w/all belongings, a/o x4, pt driven home by family
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
## 2019-09-30 NOTE — ED Notes (Signed)
Main lab to add on lipase and hfp

## 2019-10-01 DIAGNOSIS — Z8616 Personal history of COVID-19: Secondary | ICD-10-CM

## 2019-10-01 HISTORY — DX: Personal history of COVID-19: Z86.16

## 2019-10-03 ENCOUNTER — Other Ambulatory Visit: Payer: Self-pay

## 2019-10-03 ENCOUNTER — Inpatient Hospital Stay (HOSPITAL_COMMUNITY): Payer: Medicare Other

## 2019-10-03 ENCOUNTER — Inpatient Hospital Stay (HOSPITAL_COMMUNITY)
Admission: EM | Admit: 2019-10-03 | Discharge: 2019-10-08 | DRG: 441 | Disposition: A | Discharge status: Home / Self Care | Payer: Medicare Other | Attending: Internal Medicine | Admitting: Internal Medicine

## 2019-10-03 ENCOUNTER — Encounter (HOSPITAL_COMMUNITY): Payer: Self-pay | Admitting: Emergency Medicine

## 2019-10-03 DIAGNOSIS — R7989 Other specified abnormal findings of blood chemistry: Secondary | ICD-10-CM

## 2019-10-03 DIAGNOSIS — K760 Fatty (change of) liver, not elsewhere classified: Secondary | ICD-10-CM | POA: Diagnosis not present

## 2019-10-03 DIAGNOSIS — F1020 Alcohol dependence, uncomplicated: Secondary | ICD-10-CM | POA: Diagnosis present

## 2019-10-03 DIAGNOSIS — U071 COVID-19: Secondary | ICD-10-CM | POA: Diagnosis present

## 2019-10-03 DIAGNOSIS — K7011 Alcoholic hepatitis with ascites: Secondary | ICD-10-CM

## 2019-10-03 DIAGNOSIS — K7031 Alcoholic cirrhosis of liver with ascites: Secondary | ICD-10-CM | POA: Diagnosis present

## 2019-10-03 DIAGNOSIS — F10220 Alcohol dependence with intoxication, uncomplicated: Secondary | ICD-10-CM | POA: Diagnosis not present

## 2019-10-03 DIAGNOSIS — F1410 Cocaine abuse, uncomplicated: Secondary | ICD-10-CM | POA: Diagnosis present

## 2019-10-03 DIAGNOSIS — C61 Malignant neoplasm of prostate: Secondary | ICD-10-CM | POA: Diagnosis not present

## 2019-10-03 DIAGNOSIS — T510X1A Toxic effect of ethanol, accidental (unintentional), initial encounter: Secondary | ICD-10-CM | POA: Diagnosis present

## 2019-10-03 DIAGNOSIS — N179 Acute kidney failure, unspecified: Secondary | ICD-10-CM | POA: Diagnosis present

## 2019-10-03 DIAGNOSIS — Z880 Allergy status to penicillin: Secondary | ICD-10-CM

## 2019-10-03 DIAGNOSIS — Z79899 Other long term (current) drug therapy: Secondary | ICD-10-CM

## 2019-10-03 DIAGNOSIS — B179 Acute viral hepatitis, unspecified: Secondary | ICD-10-CM | POA: Diagnosis not present

## 2019-10-03 DIAGNOSIS — Z8546 Personal history of malignant neoplasm of prostate: Secondary | ICD-10-CM

## 2019-10-03 DIAGNOSIS — I10 Essential (primary) hypertension: Secondary | ICD-10-CM | POA: Diagnosis present

## 2019-10-03 DIAGNOSIS — D684 Acquired coagulation factor deficiency: Secondary | ICD-10-CM | POA: Diagnosis present

## 2019-10-03 DIAGNOSIS — R197 Diarrhea, unspecified: Secondary | ICD-10-CM | POA: Diagnosis present

## 2019-10-03 DIAGNOSIS — Z96652 Presence of left artificial knee joint: Secondary | ICD-10-CM | POA: Diagnosis present

## 2019-10-03 DIAGNOSIS — F1010 Alcohol abuse, uncomplicated: Secondary | ICD-10-CM | POA: Diagnosis not present

## 2019-10-03 DIAGNOSIS — D62 Acute posthemorrhagic anemia: Secondary | ICD-10-CM | POA: Diagnosis present

## 2019-10-03 DIAGNOSIS — K860 Alcohol-induced chronic pancreatitis: Secondary | ICD-10-CM | POA: Diagnosis present

## 2019-10-03 DIAGNOSIS — Z550 Illiteracy and low-level literacy: Secondary | ICD-10-CM

## 2019-10-03 DIAGNOSIS — R195 Other fecal abnormalities: Secondary | ICD-10-CM | POA: Diagnosis not present

## 2019-10-03 DIAGNOSIS — D689 Coagulation defect, unspecified: Secondary | ICD-10-CM | POA: Diagnosis not present

## 2019-10-03 DIAGNOSIS — G894 Chronic pain syndrome: Secondary | ICD-10-CM | POA: Diagnosis present

## 2019-10-03 DIAGNOSIS — Z79891 Long term (current) use of opiate analgesic: Secondary | ICD-10-CM

## 2019-10-03 DIAGNOSIS — E871 Hypo-osmolality and hyponatremia: Secondary | ICD-10-CM

## 2019-10-03 DIAGNOSIS — D649 Anemia, unspecified: Secondary | ICD-10-CM | POA: Diagnosis not present

## 2019-10-03 DIAGNOSIS — F329 Major depressive disorder, single episode, unspecified: Secondary | ICD-10-CM | POA: Diagnosis present

## 2019-10-03 DIAGNOSIS — Z9089 Acquired absence of other organs: Secondary | ICD-10-CM

## 2019-10-03 DIAGNOSIS — K7010 Alcoholic hepatitis without ascites: Secondary | ICD-10-CM | POA: Diagnosis present

## 2019-10-03 DIAGNOSIS — Z9049 Acquired absence of other specified parts of digestive tract: Secondary | ICD-10-CM | POA: Diagnosis not present

## 2019-10-03 DIAGNOSIS — Z87828 Personal history of other (healed) physical injury and trauma: Secondary | ICD-10-CM

## 2019-10-03 DIAGNOSIS — Z833 Family history of diabetes mellitus: Secondary | ICD-10-CM

## 2019-10-03 DIAGNOSIS — F419 Anxiety disorder, unspecified: Secondary | ICD-10-CM | POA: Diagnosis present

## 2019-10-03 DIAGNOSIS — R748 Abnormal levels of other serum enzymes: Secondary | ICD-10-CM | POA: Diagnosis not present

## 2019-10-03 DIAGNOSIS — E876 Hypokalemia: Secondary | ICD-10-CM | POA: Diagnosis present

## 2019-10-03 DIAGNOSIS — Z8719 Personal history of other diseases of the digestive system: Secondary | ICD-10-CM

## 2019-10-03 DIAGNOSIS — K7110 Toxic liver disease with hepatic necrosis, without coma: Secondary | ICD-10-CM | POA: Diagnosis present

## 2019-10-03 DIAGNOSIS — K8689 Other specified diseases of pancreas: Secondary | ICD-10-CM | POA: Diagnosis present

## 2019-10-03 DIAGNOSIS — Z923 Personal history of irradiation: Secondary | ICD-10-CM

## 2019-10-03 DIAGNOSIS — D509 Iron deficiency anemia, unspecified: Secondary | ICD-10-CM | POA: Diagnosis present

## 2019-10-03 DIAGNOSIS — T391X1A Poisoning by 4-Aminophenol derivatives, accidental (unintentional), initial encounter: Secondary | ICD-10-CM | POA: Diagnosis present

## 2019-10-03 DIAGNOSIS — K921 Melena: Secondary | ICD-10-CM | POA: Diagnosis not present

## 2019-10-03 DIAGNOSIS — K7290 Hepatic failure, unspecified without coma: Secondary | ICD-10-CM

## 2019-10-03 DIAGNOSIS — G92 Toxic encephalopathy: Secondary | ICD-10-CM | POA: Diagnosis present

## 2019-10-03 DIAGNOSIS — R110 Nausea: Secondary | ICD-10-CM

## 2019-10-03 LAB — CBC WITH DIFFERENTIAL/PLATELET
Abs Immature Granulocytes: 0.09 10*3/uL — ABNORMAL HIGH (ref 0.00–0.07)
Basophils Absolute: 0 10*3/uL (ref 0.0–0.1)
Basophils Relative: 0 %
Eosinophils Absolute: 0.1 10*3/uL (ref 0.0–0.5)
Eosinophils Relative: 1 %
HCT: 27.2 % — ABNORMAL LOW (ref 39.0–52.0)
Hemoglobin: 8.9 g/dL — ABNORMAL LOW (ref 13.0–17.0)
Immature Granulocytes: 1 %
Lymphocytes Relative: 12 %
Lymphs Abs: 0.9 10*3/uL (ref 0.7–4.0)
MCH: 25.1 pg — ABNORMAL LOW (ref 26.0–34.0)
MCHC: 32.7 g/dL (ref 30.0–36.0)
MCV: 76.8 fL — ABNORMAL LOW (ref 80.0–100.0)
Monocytes Absolute: 0.7 10*3/uL (ref 0.1–1.0)
Monocytes Relative: 9 %
Neutro Abs: 6.1 10*3/uL (ref 1.7–7.7)
Neutrophils Relative %: 77 %
Platelets: 302 10*3/uL (ref 150–400)
RBC: 3.54 MIL/uL — ABNORMAL LOW (ref 4.22–5.81)
RDW: 14.9 % (ref 11.5–15.5)
WBC: 7.9 10*3/uL (ref 4.0–10.5)
nRBC: 0 % (ref 0.0–0.2)

## 2019-10-03 LAB — COMPREHENSIVE METABOLIC PANEL
ALT: 1454 U/L — ABNORMAL HIGH (ref 0–44)
ALT: 1431 U/L — ABNORMAL HIGH (ref 0–44)
AST: 1937 U/L — ABNORMAL HIGH (ref 15–41)
AST: 1811 U/L — ABNORMAL HIGH (ref 15–41)
Albumin: 2.6 g/dL — ABNORMAL LOW (ref 3.5–5.0)
Albumin: 2.6 g/dL — ABNORMAL LOW (ref 3.5–5.0)
Alkaline Phosphatase: 218 U/L — ABNORMAL HIGH (ref 38–126)
Alkaline Phosphatase: 218 U/L — ABNORMAL HIGH (ref 38–126)
Anion gap: 12 (ref 5–15)
Anion gap: 12 (ref 5–15)
BUN: <5 mg/dL — ABNORMAL LOW (ref 6–20)
BUN: <5 mg/dL — ABNORMAL LOW (ref 6–20)
CO2: 19 mmol/L — ABNORMAL LOW (ref 22–32)
CO2: 18 mmol/L — ABNORMAL LOW (ref 22–32)
Calcium: 7.6 mg/dL — ABNORMAL LOW (ref 8.9–10.3)
Calcium: 7.8 mg/dL — ABNORMAL LOW (ref 8.9–10.3)
Chloride: 86 mmol/L — ABNORMAL LOW (ref 98–111)
Chloride: 87 mmol/L — ABNORMAL LOW (ref 98–111)
Creatinine, Ser: 1.58 mg/dL — ABNORMAL HIGH (ref 0.61–1.24)
Creatinine, Ser: 1.51 mg/dL — ABNORMAL HIGH (ref 0.61–1.24)
GFR calc Af Amer: 54 mL/min — ABNORMAL LOW (ref >60)
GFR calc Af Amer: 57 mL/min — ABNORMAL LOW (ref >60)
GFR calc non Af Amer: 47 mL/min — ABNORMAL LOW (ref >60)
GFR calc non Af Amer: 49 mL/min — ABNORMAL LOW (ref >60)
Glucose, Bld: 160 mg/dL — ABNORMAL HIGH (ref 70–99)
Glucose, Bld: 138 mg/dL — ABNORMAL HIGH (ref 70–99)
Potassium: 3.1 mmol/L — ABNORMAL LOW (ref 3.5–5.1)
Potassium: 3.1 mmol/L — ABNORMAL LOW (ref 3.5–5.1)
Sodium: 117 mmol/L — CL (ref 135–145)
Sodium: 117 mmol/L — CL (ref 135–145)
Total Bilirubin: 2.9 mg/dL — ABNORMAL HIGH (ref 0.3–1.2)
Total Bilirubin: 3.1 mg/dL — ABNORMAL HIGH (ref 0.3–1.2)
Total Protein: 6.4 g/dL — ABNORMAL LOW (ref 6.5–8.1)
Total Protein: 6.7 g/dL (ref 6.5–8.1)

## 2019-10-03 LAB — URINALYSIS, ROUTINE W REFLEX MICROSCOPIC
Bilirubin Urine: NEGATIVE
Glucose, UA: NEGATIVE mg/dL
Hgb urine dipstick: NEGATIVE
Ketones, ur: NEGATIVE mg/dL
Leukocytes,Ua: NEGATIVE
Nitrite: NEGATIVE
Protein, ur: NEGATIVE mg/dL
Specific Gravity, Urine: 1.005 (ref 1.005–1.030)
pH: 6 (ref 5.0–8.0)

## 2019-10-03 LAB — LIPASE, BLOOD: Lipase: 46 U/L (ref 11–51)

## 2019-10-03 LAB — CBC
HCT: 27.1 % — ABNORMAL LOW (ref 39.0–52.0)
Hemoglobin: 8.8 g/dL — ABNORMAL LOW (ref 13.0–17.0)
MCH: 25.1 pg — ABNORMAL LOW (ref 26.0–34.0)
MCHC: 32.5 g/dL (ref 30.0–36.0)
MCV: 77.4 fL — ABNORMAL LOW (ref 80.0–100.0)
Platelets: 317 10*3/uL (ref 150–400)
RBC: 3.5 MIL/uL — ABNORMAL LOW (ref 4.22–5.81)
RDW: 15 % (ref 11.5–15.5)
WBC: 7.6 10*3/uL (ref 4.0–10.5)
nRBC: 0 % (ref 0.0–0.2)

## 2019-10-03 LAB — OSMOLALITY, URINE: Osmolality, Ur: 127 mOsm/kg — ABNORMAL LOW (ref 300–900)

## 2019-10-03 LAB — PROTEIN / CREATININE RATIO, URINE
Creatinine, Urine: 59.76 mg/dL
Protein Creatinine Ratio: 0.13 mg/mg{Cre} (ref 0.00–0.15)
Total Protein, Urine: 8 mg/dL

## 2019-10-03 LAB — SODIUM, URINE, RANDOM: Sodium, Ur: <10 mmol/L

## 2019-10-03 LAB — POC OCCULT BLOOD, ED: Fecal Occult Bld: POSITIVE — AB

## 2019-10-03 MED ORDER — LORAZEPAM 1 MG PO TABS
0.0000 mg | ORAL_TABLET | Freq: Four times a day (QID) | ORAL | Status: AC
  Administered 2019-10-05 (×2): 1 mg via ORAL
  Filled 2019-10-03 (×3): qty 1

## 2019-10-03 MED ORDER — LORAZEPAM 1 MG PO TABS
0.0000 mg | ORAL_TABLET | Freq: Two times a day (BID) | ORAL | Status: AC
  Administered 2019-10-06 – 2019-10-07 (×3): 2 mg via ORAL
  Filled 2019-10-03: qty 2
  Filled 2019-10-03: qty 4
  Filled 2019-10-03: qty 2

## 2019-10-03 MED ORDER — LORAZEPAM 2 MG/ML IJ SOLN
0.0000 mg | Freq: Four times a day (QID) | INTRAMUSCULAR | Status: AC
  Administered 2019-10-04 (×3): 1 mg via INTRAVENOUS
  Filled 2019-10-03 (×3): qty 1

## 2019-10-03 MED ORDER — SODIUM CHLORIDE 0.9% FLUSH
3.0000 mL | Freq: Once | INTRAVENOUS | Status: AC
  Administered 2019-10-03: 3 mL via INTRAVENOUS

## 2019-10-03 MED ORDER — SODIUM CHLORIDE 0.9 % IV BOLUS: 1000.0000 mL | Freq: Once | INTRAVENOUS | Status: DC

## 2019-10-03 MED ORDER — LORAZEPAM 2 MG/ML IJ SOLN: 0.0000 mg | Freq: Two times a day (BID) | INTRAMUSCULAR | Status: AC
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
## 2019-10-03 NOTE — ED Notes (Signed)
Patient transported to CT 

## 2019-10-03 NOTE — ED Notes (Signed)
Date and time results received: 10/03/19 2030 (use smartphrase ".now" to insert current time)  Test: serum sodium Critical Value: 117  Name of Provider Notified: Dr. Maryan Rued  Orders Received? Or Actions Taken?: Actions Taken: fluid bolus d/ced

## 2019-10-03 NOTE — ED Notes (Addendum)
Pt aware of need for urine sample, states that he just urinated in lobby without catching sample. Urinal at bedside.

## 2019-10-03 NOTE — ED Notes (Signed)
Confirmed with Dr. Maryan Rued that she is aware pt sodium is 12mmol/L, would like for this lb to be redrawn to confirm value

## 2019-10-03 NOTE — ED Triage Notes (Signed)
Pt. Stated, I was here yesterday for chest pain, and I started having N/V yesterday.
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
## 2019-10-03 NOTE — ED Notes (Signed)
Pt reports drinking 12pk of beer daily, was treated for alcoholism at a rehab facility 1 yr ago, last drink this AM

## 2019-10-04 ENCOUNTER — Encounter (HOSPITAL_COMMUNITY): Payer: Self-pay

## 2019-10-04 DIAGNOSIS — F1010 Alcohol abuse, uncomplicated: Secondary | ICD-10-CM | POA: Diagnosis not present

## 2019-10-04 DIAGNOSIS — I10 Essential (primary) hypertension: Secondary | ICD-10-CM

## 2019-10-04 DIAGNOSIS — R7989 Other specified abnormal findings of blood chemistry: Secondary | ICD-10-CM

## 2019-10-04 DIAGNOSIS — N179 Acute kidney failure, unspecified: Secondary | ICD-10-CM

## 2019-10-04 DIAGNOSIS — K7010 Alcoholic hepatitis without ascites: Secondary | ICD-10-CM | POA: Diagnosis present

## 2019-10-04 DIAGNOSIS — E876 Hypokalemia: Secondary | ICD-10-CM

## 2019-10-04 DIAGNOSIS — R748 Abnormal levels of other serum enzymes: Secondary | ICD-10-CM

## 2019-10-04 DIAGNOSIS — D689 Coagulation defect, unspecified: Secondary | ICD-10-CM

## 2019-10-04 DIAGNOSIS — K760 Fatty (change of) liver, not elsewhere classified: Secondary | ICD-10-CM

## 2019-10-04 DIAGNOSIS — C61 Malignant neoplasm of prostate: Secondary | ICD-10-CM

## 2019-10-04 LAB — CBC
HCT: 26.6 % — ABNORMAL LOW (ref 39.0–52.0)
Hemoglobin: 8.7 g/dL — ABNORMAL LOW (ref 13.0–17.0)
MCH: 25.2 pg — ABNORMAL LOW (ref 26.0–34.0)
MCHC: 32.7 g/dL (ref 30.0–36.0)
MCV: 77.1 fL — ABNORMAL LOW (ref 80.0–100.0)
Platelets: 338 10*3/uL (ref 150–400)
RBC: 3.45 MIL/uL — ABNORMAL LOW (ref 4.22–5.81)
RDW: 14.9 % (ref 11.5–15.5)
WBC: 7.1 10*3/uL (ref 4.0–10.5)
nRBC: 0 % (ref 0.0–0.2)

## 2019-10-04 LAB — RAPID URINE DRUG SCREEN, HOSP PERFORMED
Amphetamines: NOT DETECTED
Amphetamines: NOT DETECTED
Barbiturates: NOT DETECTED
Barbiturates: NOT DETECTED
Benzodiazepines: NOT DETECTED
Benzodiazepines: NOT DETECTED
Cocaine: NOT DETECTED
Cocaine: NOT DETECTED
Opiates: NOT DETECTED
Opiates: NOT DETECTED
Tetrahydrocannabinol: NOT DETECTED
Tetrahydrocannabinol: NOT DETECTED

## 2019-10-04 LAB — CBC WITH DIFFERENTIAL/PLATELET
Abs Immature Granulocytes: 0.08 10*3/uL — ABNORMAL HIGH (ref 0.00–0.07)
Basophils Absolute: 0 10*3/uL (ref 0.0–0.1)
Basophils Relative: 0 %
Eosinophils Absolute: 0 10*3/uL (ref 0.0–0.5)
Eosinophils Relative: 0 %
HCT: 28.3 % — ABNORMAL LOW (ref 39.0–52.0)
Hemoglobin: 9.3 g/dL — ABNORMAL LOW (ref 13.0–17.0)
Immature Granulocytes: 1 %
Lymphocytes Relative: 7 %
Lymphs Abs: 0.7 10*3/uL (ref 0.7–4.0)
MCH: 25.2 pg — ABNORMAL LOW (ref 26.0–34.0)
MCHC: 32.9 g/dL (ref 30.0–36.0)
MCV: 76.7 fL — ABNORMAL LOW (ref 80.0–100.0)
Monocytes Absolute: 0.9 10*3/uL (ref 0.1–1.0)
Monocytes Relative: 10 %
Neutro Abs: 7.2 10*3/uL (ref 1.7–7.7)
Neutrophils Relative %: 82 %
Platelets: 386 10*3/uL (ref 150–400)
RBC: 3.69 MIL/uL — ABNORMAL LOW (ref 4.22–5.81)
RDW: 15 % (ref 11.5–15.5)
WBC: 8.9 10*3/uL (ref 4.0–10.5)
nRBC: 0 % (ref 0.0–0.2)

## 2019-10-04 LAB — HEPATITIS PANEL, ACUTE
HCV Ab: NONREACTIVE
Hep A IgM: NONREACTIVE
Hep B C IgM: NONREACTIVE
Hepatitis B Surface Ag: NONREACTIVE

## 2019-10-04 LAB — COMPREHENSIVE METABOLIC PANEL
ALT: 1210 U/L — ABNORMAL HIGH (ref 0–44)
AST: 1238 U/L — ABNORMAL HIGH (ref 15–41)
Albumin: 2.6 g/dL — ABNORMAL LOW (ref 3.5–5.0)
Alkaline Phosphatase: 225 U/L — ABNORMAL HIGH (ref 38–126)
Anion gap: 9 (ref 5–15)
BUN: <5 mg/dL — ABNORMAL LOW (ref 6–20)
CO2: 22 mmol/L (ref 22–32)
Calcium: 8.1 mg/dL — ABNORMAL LOW (ref 8.9–10.3)
Chloride: 91 mmol/L — ABNORMAL LOW (ref 98–111)
Creatinine, Ser: 1.27 mg/dL — ABNORMAL HIGH (ref 0.61–1.24)
GFR calc Af Amer: >60 mL/min (ref >60)
GFR calc non Af Amer: >60 mL/min (ref >60)
Glucose, Bld: 84 mg/dL (ref 70–99)
Potassium: 3.3 mmol/L — ABNORMAL LOW (ref 3.5–5.1)
Sodium: 122 mmol/L — ABNORMAL LOW (ref 135–145)
Total Bilirubin: 3.5 mg/dL — ABNORMAL HIGH (ref 0.3–1.2)
Total Protein: 6.4 g/dL — ABNORMAL LOW (ref 6.5–8.1)

## 2019-10-04 LAB — TYPE AND SCREEN
ABO/RH(D): O POS
Antibody Screen: NEGATIVE

## 2019-10-04 LAB — IRON AND TIBC
Iron: 14 ug/dL — ABNORMAL LOW (ref 45–182)
Saturation Ratios: 5 % — ABNORMAL LOW (ref 17.9–39.5)
TIBC: 293 ug/dL (ref 250–450)
UIBC: 279 ug/dL

## 2019-10-04 LAB — HCV RNA QUANT: HCV Quantitative: NOT DETECTED IU/mL (ref >50)

## 2019-10-04 LAB — RETICULOCYTES
Immature Retic Fract: 32.7 % — ABNORMAL HIGH (ref 2.3–15.9)
RBC.: 3.48 MIL/uL — ABNORMAL LOW (ref 4.22–5.81)
Retic Count, Absolute: 71.7 10*3/uL (ref 19.0–186.0)
Retic Ct Pct: 2.1 % (ref 0.4–3.1)

## 2019-10-04 LAB — SARS CORONAVIRUS 2 (TAT 6-24 HRS): SARS Coronavirus 2: POSITIVE — AB

## 2019-10-04 LAB — FERRITIN
Ferritin: 100 ng/mL (ref 24–336)
Ferritin: 157 ng/mL (ref 24–336)

## 2019-10-04 LAB — D-DIMER, QUANTITATIVE: D-Dimer, Quant: 9.12 ug/mL-FEU — ABNORMAL HIGH (ref 0.00–0.50)

## 2019-10-04 LAB — ACETAMINOPHEN LEVEL: Acetaminophen (Tylenol), Serum: 23 ug/mL (ref 10–30)

## 2019-10-04 LAB — PHOSPHORUS: Phosphorus: 1.8 mg/dL — ABNORMAL LOW (ref 2.5–4.6)

## 2019-10-04 LAB — MAGNESIUM: Magnesium: 2.2 mg/dL (ref 1.7–2.4)

## 2019-10-04 LAB — CK: Total CK: 100 U/L (ref 49–397)

## 2019-10-04 LAB — FOLATE: Folate: 24.1 ng/mL (ref >5.9)

## 2019-10-04 LAB — VITAMIN B12: Vitamin B-12: 3603 pg/mL — ABNORMAL HIGH (ref 180–914)

## 2019-10-04 LAB — PROTIME-INR
INR: 1.7 — ABNORMAL HIGH (ref 0.8–1.2)
Prothrombin Time: 20.1 seconds — ABNORMAL HIGH (ref 11.4–15.2)

## 2019-10-04 LAB — LACTATE DEHYDROGENASE: LDH: 228 U/L — ABNORMAL HIGH (ref 98–192)

## 2019-10-04 LAB — BRAIN NATRIURETIC PEPTIDE: B Natriuretic Peptide: 184.6 pg/mL — ABNORMAL HIGH (ref 0.0–100.0)

## 2019-10-04 LAB — HIV ANTIBODY (ROUTINE TESTING W REFLEX): HIV Screen 4th Generation wRfx: NONREACTIVE

## 2019-10-04 LAB — C-REACTIVE PROTEIN: CRP: 2.6 mg/dL — ABNORMAL HIGH (ref <1.0)

## 2019-10-04 LAB — PROCALCITONIN: Procalcitonin: 0.84 ng/mL

## 2019-10-04 MED ORDER — ADULT MULTIVITAMIN W/MINERALS CH
1.0000 | ORAL_TABLET | Freq: Every day | ORAL | Status: DC
  Administered 2019-10-05 – 2019-10-08 (×4): 1 via ORAL
  Filled 2019-10-04 (×4): qty 1

## 2019-10-04 MED ORDER — FOLIC ACID 1 MG PO TABS
1.0000 mg | ORAL_TABLET | Freq: Every day | ORAL | Status: DC
  Administered 2019-10-05 – 2019-10-08 (×4): 1 mg via ORAL
  Filled 2019-10-04 (×4): qty 1

## 2019-10-04 MED ORDER — HYDROCHLOROTHIAZIDE 12.5 MG PO CAPS: 12.5000 mg | ORAL_CAPSULE | Freq: Every morning | ORAL | Status: DC

## 2019-10-04 MED ORDER — ONDANSETRON HCL 4 MG/2ML IJ SOLN: 4.0000 mg | Freq: Four times a day (QID) | INTRAMUSCULAR | Status: DC | PRN

## 2019-10-04 MED ORDER — VITAMIN C 500 MG PO TABS
500.0000 mg | ORAL_TABLET | Freq: Every day | ORAL | Status: DC
  Administered 2019-10-04 – 2019-10-08 (×5): 500 mg via ORAL
  Filled 2019-10-04 (×5): qty 1

## 2019-10-04 MED ORDER — GUAIFENESIN-DM 100-10 MG/5ML PO SYRP: 10.0000 mL | ORAL_SOLUTION | ORAL | Status: DC | PRN

## 2019-10-04 MED ORDER — PANTOPRAZOLE SODIUM 40 MG PO TBEC
40.0000 mg | DELAYED_RELEASE_TABLET | Freq: Two times a day (BID) | ORAL | Status: DC
  Administered 2019-10-04 – 2019-10-06 (×4): 40 mg via ORAL
  Filled 2019-10-04 (×4): qty 1

## 2019-10-04 MED ORDER — AMLODIPINE BESYLATE 10 MG PO TABS
10.0000 mg | ORAL_TABLET | Freq: Every morning | ORAL | Status: DC
  Administered 2019-10-04 – 2019-10-08 (×5): 10 mg via ORAL
  Filled 2019-10-04 (×3): qty 1
  Filled 2019-10-04: qty 2
  Filled 2019-10-04: qty 1

## 2019-10-04 MED ORDER — SODIUM CHLORIDE 0.9 % IV SOLN
510.0000 mg | Freq: Once | INTRAVENOUS | Status: AC
  Administered 2019-10-04: 510 mg via INTRAVENOUS
  Filled 2019-10-04: qty 17

## 2019-10-04 MED ORDER — DEXTROSE 5 % IV SOLN
6.2500 mg/kg/h | INTRAVENOUS | Status: DC
  Administered 2019-10-04: 6.25 mg/kg/h via INTRAVENOUS
  Filled 2019-10-04: qty 120

## 2019-10-04 MED ORDER — PRAVASTATIN SODIUM 10 MG PO TABS
20.0000 mg | ORAL_TABLET | Freq: Every morning | ORAL | Status: DC
  Administered 2019-10-04: 20 mg via ORAL
  Filled 2019-10-04: qty 2

## 2019-10-04 MED ORDER — POTASSIUM CHLORIDE CRYS ER 20 MEQ PO TBCR
40.0000 meq | EXTENDED_RELEASE_TABLET | ORAL | Status: AC
  Administered 2019-10-04 (×2): 40 meq via ORAL
  Filled 2019-10-04 (×2): qty 2

## 2019-10-04 MED ORDER — DEXTROSE 5 % IV SOLN: 6.2500 mg/kg/h | INTRAVENOUS | Status: DC

## 2019-10-04 MED ORDER — VITAMIN K1 10 MG/ML IJ SOLN
10.0000 mg | Freq: Once | INTRAVENOUS | Status: AC
  Administered 2019-10-04: 10 mg via INTRAVENOUS
  Filled 2019-10-04: qty 1

## 2019-10-04 MED ORDER — IPRATROPIUM-ALBUTEROL 20-100 MCG/ACT IN AERS
1.0000 | INHALATION_SPRAY | Freq: Four times a day (QID) | RESPIRATORY_TRACT | Status: DC
  Administered 2019-10-04 – 2019-10-08 (×16): 1 via RESPIRATORY_TRACT
  Filled 2019-10-04: qty 4

## 2019-10-04 MED ORDER — ONDANSETRON HCL 4 MG PO TABS: 4.0000 mg | ORAL_TABLET | Freq: Four times a day (QID) | ORAL | Status: DC | PRN

## 2019-10-04 MED ORDER — DEXTROSE 5 % IV SOLN
12.5000 mg/kg/h | INTRAVENOUS | Status: AC
  Filled 2019-10-04: qty 120

## 2019-10-04 MED ORDER — ACETYLCYSTEINE LOAD VIA INFUSION
150.0000 mg/kg | Freq: Once | INTRAVENOUS | Status: AC
  Administered 2019-10-04 (×2): 14295 mg via INTRAVENOUS
  Filled 2019-10-04: qty 358

## 2019-10-04 MED ORDER — PREDNISOLONE 5 MG PO TABS
40.0000 mg | ORAL_TABLET | Freq: Every day | ORAL | Status: DC
  Filled 2019-10-04: qty 8
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
## 2019-10-04 NOTE — ED Notes (Signed)
Lunch Tray Ordered @ 1020.  

## 2019-10-04 NOTE — Progress Notes (Signed)
PROGRESS NOTE  Chad Turner ACZ:[id number] DOB: 1961-08-26   PCP: Medicine, [facility]  Patient is from: Home  DOA: 10/03/2019 LOS: 1  Brief Narrative / Interim history: 58 year old man with history of alcohol abuse, alcoholic cirrhosis? depression, chronic pain syndrome, prostate cancer s/p radiotherapy and essential hypertension presented 10/03/2019 with nausea and vomiting for 1 day. Admitted for alcoholic hepatitis, hyponatremia and AKI.  GI consulted for alcoholic hepatitis. Had 12 beers the day of admission.   Subjective: No major events overnight of this morning.  No further nausea, vomiting or abdominal pain.  Denies chest pain, dyspnea or UTI symptoms.  He says he would like to quit.  He says he quit in the past but relapsed from being around his friends were drinking.  Denies smoking cigarette or recreational drug use.   Objective: Vitals:   10/04/19 0750 10/04/19 0800 10/04/19 0900 10/04/19 0953  BP: 107/62 122/81 112/82 138/83  Pulse: 82 (!) 101 96   Resp:  17 18   Temp:      TempSrc:      SpO2:  95% 96%     Intake/Output Summary (Last 24 hours) at 10/04/2019 1143 Last data filed at 10/04/2019 0800 Gross per 24 hour  Intake -  Output 300 ml  Net -300 ml   There were no vitals filed for this visit.  Examination:  GENERAL: No acute distress.  Appears well.  HEENT: MMM.  Sclera anicteric. NECK: Supple.  No apparent JVD.  RESP:  No IWOB. Good air movement bilaterally. CVS:  RRR. Heart sounds normal.  ABD/GI/GU: Bowel sounds present. Soft. Non tender.  MSK/EXT:  No apparent deformity or edema. Moves extremities. SKIN: no apparent skin lesion or wound NEURO: Awake, alert and oriented appropriately.  No gross deficit.  No tremors PSYCH: Calm. Normal affect.  Assessment & Plan: Alcohol abuse-reportedly had 12 beers the day of admission.  No significant withdrawal symptoms yet. Alcoholic hepatitis/hepatic steatosis versus cirrhosis-HIV and acute  hepatitis panel negative. Elevated alkaline phosphatase/hyperbilirubinemia-CK within normal range. -Liver Doppler with patent and unremarkable portal and hepatic veins.  No ascites -Liver enzymes improving. -GI consulted in ED-follow recommendations -Continue CIWA protocol -IV fluid and multivitamins. -Check UDS  Acute kidney injury: Cr ~0.6 (baseline) > 1.58> 1.27.  Likely due to the above.  Improving. Hyponatremia/hypokalemia likely due to the above.  HCTZ could contribute.  Improving. -Discontinue HCTZ -Replenish and recheck -Check magnesium and phosphorus  Coagulopathy: Likely due to alcoholic hepatitis. -Received vitamin K in ED. -Continue trending  Iron deficiency anemia: due to GI bleed and alcoholism.  Anemia panel reveals IDA.  H&H relatively stable. Acute GI bleed: FOBT positive. -P.o. Protonix 40 mg twice daily -IV Feraheme 510 mg once -Secure to PIV lines -Monitor H&H -P.o. ferrous sulfate after GI evaluation.  Essential hypertension: Normotensive -Discontinue HCTZ  History of prostate cancer status post radiotherapy-no symptoms -Outpatient follow-up  COVID-19 infection: patient without respiratory symptoms.  GI symptoms improved.  Normal saturation on room air.  -Check inflammatory markers and CXR.               DVT prophylaxis: SCD in the setting of GI bleed Code Status: Full code Family Communication: Patient and/or RN. Available if any question.  Disposition Plan: Remains inpatient for evaluation of GI bleed and alcoholic hepatitis.  COVID-19 positive. Consultants: GI  Procedures:  None  Microbiology summarized: [id number] positive.  Sch Meds:  Scheduled Meds: . amLODipine  10 mg Oral q morning - 10a  .  LORazepam  0-4 mg Intravenous Q6H   Or  . LORazepam  0-4 mg Oral Q6H  . [START ON 10/06/2019] LORazepam  0-4 mg Intravenous Q12H   Or  . [START ON 10/06/2019] LORazepam  0-4 mg Oral Q12H  . potassium chloride  40 mEq Oral Q4H  . pravastatin   20 mg Oral q morning - 10a  . prednisoLONE  40 mg Oral Daily  . vitamin C  500 mg Oral Daily   Continuous Infusions: PRN Meds:.ondansetron **OR** ondansetron (ZOFRAN) IV  Antimicrobials: Anti-infectives (From admission, onward)   None       I have personally reviewed the following labs and images: CBC: Recent Labs  Lab 09/30/19 1412 10/03/19 1731 10/03/19 1930 10/04/19 0534  WBC 12.0* 7.6 7.9 7.1  NEUTROABS  --   --  6.1  --   HGB 10.2* 8.8* 8.9* 8.7*  HCT 33.1* 27.1* 27.2* 26.6*  MCV 85.8 77.4* 76.8* 77.1*  PLT 476* 317 302 338   BMP &GFR Recent Labs  Lab 09/30/19 1412 10/03/19 1731 10/03/19 1930 10/04/19 0534  NA 133* 117* 117* 122*  K 3.7 3.1* 3.1* 3.3*  CL 101 86* 87* 91*  CO2 22 19* 18* 22  GLUCOSE 99 160* 138* 84  BUN <5* <5* <5* <5*  CREATININE 0.66 1.58* 1.51* 1.27*  CALCIUM 8.3* 7.6* 7.8* 8.1*   Estimated Creatinine Clearance: 71.7 mL/min (A) (by C-G formula based on SCr of 1.27 mg/dL (H)). Liver & Pancreas: Recent Labs  Lab 09/30/19 1655 10/03/19 1731 10/03/19 1930 10/04/19 0534  AST 294* 1,937* 1,811* 1,238*  ALT 106* 1,454* 1,431* 1,210*  ALKPHOS 184* 218* 218* 225*  BILITOT 2.2* 2.9* 3.1* 3.5*  PROT 7.6 6.4* 6.7 6.4*  ALBUMIN 3.0* 2.6* 2.6* 2.6*   Recent Labs  Lab 09/30/19 1655 10/03/19 1731  LIPASE 22 46   No results for input(s): AMMONIA in the last 168 hours. Diabetic: No results for input(s): HGBA1C in the last 72 hours. No results for input(s): GLUCAP in the last 168 hours. Cardiac Enzymes: Recent Labs  Lab 10/04/19 0007  CKTOTAL 100   No results for input(s): PROBNP in the last 8760 hours. Coagulation Profile: Recent Labs  Lab 10/04/19 0007  INR 1.7*   Thyroid Function Tests: No results for input(s): TSH, T4TOTAL, FREET4, T3FREE, THYROIDAB in the last 72 hours. Lipid Profile: No results for input(s): CHOL, HDL, LDLCALC, TRIG, CHOLHDL, LDLDIRECT in the last 72 hours. Anemia Panel: Recent Labs    10/04/19 0007   VITAMINB12 3,603*  FOLATE 24.1  FERRITIN 100  TIBC 293  IRON 14*  RETICCTPCT 2.1   Urine analysis:    Component Value Date/Time   COLORURINE YELLOW 10/03/2019 2045   APPEARANCEUR CLEAR 10/03/2019 2045   LABSPEC 1.005 10/03/2019 2045   PHURINE 6.0 10/03/2019 2045   GLUCOSEU NEGATIVE 10/03/2019 2045   HGBUR NEGATIVE 10/03/2019 2045   BILIRUBINUR NEGATIVE 10/03/2019 2045   KETONESUR NEGATIVE 10/03/2019 2045   PROTEINUR NEGATIVE 10/03/2019 2045   UROBILINOGEN 1.0 09/18/2015 1459   NITRITE NEGATIVE 10/03/2019 2045   LEUKOCYTESUR NEGATIVE 10/03/2019 2045   Sepsis Labs: Invalid input(s): PROCALCITONIN, [place]  Microbiology: Recent Results (from the past 240 hour(s))  SARS CORONAVIRUS 2 (TAT 6-24 HRS) Nasopharyngeal Nasopharyngeal Swab     Status: Abnormal   Collection Time: 10/04/19  2:28 AM   Specimen: Nasopharyngeal Swab  Result Value Ref Range Status   SARS Coronavirus 2 POSITIVE (A) NEGATIVE Final    Comment: RESULT CALLED TO, READ BACK BY  AND VERIFIED WITH: K. Gardner, [facility] ED RN AT [phone] ON 10/04/19 BY C. JESSUP, MT. (NOTE) SARS-CoV-2 target nucleic acids are DETECTED. The SARS-CoV-2 RNA is generally detectable in upper and lower respiratory specimens during the acute phase of infection. Positive results are indicative of the presence of SARS-CoV-2 RNA. Clinical correlation with patient history and other diagnostic information is  necessary to determine patient infection status. Positive results do not rule out bacterial infection or co-infection with other viruses.  The expected result is Negative. Fact Sheet for Patients: SugarRoll.be Fact Sheet for Healthcare Providers: https://www.woods-mathews.com/ This test is not yet approved or cleared by the [place] FDA and  has been authorized for detection and/or diagnosis of SARS-CoV-2 by FDA under an Emergency Use Authorization (EUA). This EUA will remain  in effect (meaning  this test c an be used) for the duration of the COVID-19 declaration under Section 564(b)(1) of the Act, 21 U.S.C. section 360bbb-3(b)(1), unless the authorization is terminated or revoked sooner. Performed at [facility], [place] [address], [place], [place] [address]     Radiology Studies: US Liver Doppler  Result Date: 10/04/2019 CLINICAL DATA:  58 year old male with a history of alcohol abuse, hepatomegaly, steatosis and elevated LFTs. EXAM: DUPLEX ULTRASOUND OF LIVER TECHNIQUE: Color and duplex Doppler ultrasound was performed to evaluate the hepatic in-flow and out-flow vessels. COMPARISON:  MR abdomen 12/13/2017 FINDINGS: Liver: The hepatic parenchyma is heterogeneous, echogenic and coarsened. The adjacent renal parenchyma appears hypoechoic in comparison. The hepatic contour is not well seen. No focal lesion, mass or intrahepatic biliary ductal dilatation. Main Portal Vein size: 1.1 cm cm Portal Vein Velocities Main Prox:  46 cm/sec with normal hepatopetal flow. Main Mid: 41 cm/sec  with normal hepatopetal flow. Main Dist:  42 cm/sec  with normal hepatopetal flow. Right: 32 cm/sec Left: 34 cm/sec Hepatic Vein Velocities Right:  42 cm/sec Middle:  28 cm/sec Left:  25 cm/sec IVC: Present and patent with normal respiratory phasicity. Hepatic Artery Velocity:  122 cm/sec Splenic Vein Velocity:  18 cm/sec Spleen: 7.6 cm x 10.4 cm x 4.0 cm with a total volume of 164 cm^3 (411 cm^3 is upper limit normal) Portal Vein Occlusion/Thrombus: No Splenic Vein Occlusion/Thrombus: No Ascites: None Varices: None IMPRESSION: 1. Patent and unremarkable portal and hepatic veins. No evidence of portal hypertension or venous thrombosis. 2. Diffusely heterogeneous and echogenic liver parenchyma. The sonographic appearance is most consistent with hepatic steatosis. Cirrhosis from non steatotic causes can have a similar appearance but is usually somewhat less echogenic. 3. No evidence of ascites. Electronically Signed    By: Jacqulynn Cadet M.D.   On: 10/04/2019 08:39    45 minutes with more than 50% spent in reviewing records, counseling patient/family and coordinating care.  Huxley Shurley T. [place]  If 7PM-7AM, please contact night-coverage [email or web address] Password [email or web address] 10/04/2019, 11:43 AM
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
## 2019-10-04 NOTE — ED Notes (Signed)
Ordered breakfast--Chad Turner 

## 2019-10-05 ENCOUNTER — Other Ambulatory Visit: Payer: Self-pay

## 2019-10-05 DIAGNOSIS — F1010 Alcohol abuse, uncomplicated: Secondary | ICD-10-CM | POA: Diagnosis not present

## 2019-10-05 DIAGNOSIS — R7989 Other specified abnormal findings of blood chemistry: Secondary | ICD-10-CM | POA: Diagnosis not present

## 2019-10-05 LAB — COMPREHENSIVE METABOLIC PANEL
ALT: 1010 U/L — ABNORMAL HIGH (ref 0–44)
AST: 622 U/L — ABNORMAL HIGH (ref 15–41)
Albumin: 2.8 g/dL — ABNORMAL LOW (ref 3.5–5.0)
Alkaline Phosphatase: 207 U/L — ABNORMAL HIGH (ref 38–126)
Anion gap: 12 (ref 5–15)
BUN: <5 mg/dL — ABNORMAL LOW (ref 6–20)
CO2: 22 mmol/L (ref 22–32)
Calcium: 8.9 mg/dL (ref 8.9–10.3)
Chloride: 98 mmol/L (ref 98–111)
Creatinine, Ser: 1.03 mg/dL (ref 0.61–1.24)
GFR calc Af Amer: >60 mL/min (ref >60)
GFR calc non Af Amer: >60 mL/min (ref >60)
Glucose, Bld: 76 mg/dL (ref 70–99)
Potassium: 3.9 mmol/L (ref 3.5–5.1)
Sodium: 132 mmol/L — ABNORMAL LOW (ref 135–145)
Total Bilirubin: 3.4 mg/dL — ABNORMAL HIGH (ref 0.3–1.2)
Total Protein: 7.5 g/dL (ref 6.5–8.1)

## 2019-10-05 LAB — MAGNESIUM: Magnesium: 2.2 mg/dL (ref 1.7–2.4)

## 2019-10-05 LAB — CBC WITH DIFFERENTIAL/PLATELET
Abs Immature Granulocytes: 0.05 10*3/uL (ref 0.00–0.07)
Basophils Absolute: 0 10*3/uL (ref 0.0–0.1)
Basophils Relative: 0 %
Eosinophils Absolute: 0 10*3/uL (ref 0.0–0.5)
Eosinophils Relative: 1 %
HCT: 28.1 % — ABNORMAL LOW (ref 39.0–52.0)
Hemoglobin: 9.5 g/dL — ABNORMAL LOW (ref 13.0–17.0)
Immature Granulocytes: 1 %
Lymphocytes Relative: 18 %
Lymphs Abs: 1.5 10*3/uL (ref 0.7–4.0)
MCH: 25.9 pg — ABNORMAL LOW (ref 26.0–34.0)
MCHC: 33.8 g/dL (ref 30.0–36.0)
MCV: 76.6 fL — ABNORMAL LOW (ref 80.0–100.0)
Monocytes Absolute: 1.4 10*3/uL — ABNORMAL HIGH (ref 0.1–1.0)
Monocytes Relative: 17 %
Neutro Abs: 5.4 10*3/uL (ref 1.7–7.7)
Neutrophils Relative %: 63 %
Platelets: 395 10*3/uL (ref 150–400)
RBC: 3.67 MIL/uL — ABNORMAL LOW (ref 4.22–5.81)
RDW: 15.1 % (ref 11.5–15.5)
WBC: 8.5 10*3/uL (ref 4.0–10.5)
nRBC: 0.4 % — ABNORMAL HIGH (ref 0.0–0.2)

## 2019-10-05 LAB — MRSA PCR SCREENING: MRSA by PCR: NEGATIVE

## 2019-10-05 LAB — ACETAMINOPHEN LEVEL: Acetaminophen (Tylenol), Serum: <10 ug/mL — ABNORMAL LOW (ref 10–30)

## 2019-10-05 LAB — C-REACTIVE PROTEIN: CRP: 2.7 mg/dL — ABNORMAL HIGH (ref <1.0)

## 2019-10-05 LAB — D-DIMER, QUANTITATIVE: D-Dimer, Quant: 7.36 ug/mL-FEU — ABNORMAL HIGH (ref 0.00–0.50)

## 2019-10-05 LAB — FERRITIN: Ferritin: 372 ng/mL — ABNORMAL HIGH (ref 24–336)

## 2019-10-05 LAB — PHOSPHORUS: Phosphorus: 1.8 mg/dL — ABNORMAL LOW (ref 2.5–4.6)

## 2019-10-05 LAB — PROTIME-INR
INR: 1.5 — ABNORMAL HIGH (ref 0.8–1.2)
Prothrombin Time: 17.8 seconds — ABNORMAL HIGH (ref 11.4–15.2)

## 2019-10-05 MED ORDER — SODIUM PHOSPHATES 45 MMOLE/15ML IV SOLN
30.0000 mmol | Freq: Once | INTRAVENOUS | Status: AC
  Administered 2019-10-05: 30 mmol via INTRAVENOUS
  Filled 2019-10-05: qty 10

## 2019-10-05 MED ORDER — ENOXAPARIN SODIUM 40 MG/0.4ML ~~LOC~~ SOLN: 40.0000 mg | Freq: Every day | SUBCUTANEOUS | Status: DC

## 2019-10-05 NOTE — Progress Notes (Addendum)
PROGRESS NOTE    Chad Turner  CZY:[id number] DOB: September 30, 1961 DOA: 10/03/2019 PCP: Medicine, [facility]   Brief Narrative: 58 year-old with past medical history significant for alcohol abuse, alcoholic cirrhosis?,  Depression, chronic pain syndrome, prostate cancer status post radiotherapy, hypertension who presents on 10/03/2019 with nausea and vomiting for 1 day duration.  Patient reporter 12 beers a day of admission. He was admitted with alcoholic hepatitis, hyponatremia and AKI.  GI was consulted for alcoholic hepatitis.   Assessment & Plan:   Principal Problem:   Alcoholic hepatitis with ascites Active Problems:   Alcohol dependence (HCC)   Cocaine abuse (HCC)   Pancreatic mass   Hypertension   Hypokalemia   Malignant neoplasm of prostate (HCC)   AKI (acute kidney injury) ([place])   Alcoholic hepatitis   1-Alcoholic hepatitis versus decompensated liver failure related to Tylenol intake and alcohol intake -Discussed with GI, continue with Acetylcysteine for another day independent of Tylenol level -Liver function tests trending down. -Might have component of COVID-19 affecting liver function -Acute hepatitis panel negative  2-AKI; Creatinine baseline 0.6. -Creatinine on admission 1.5. Continue to trend down. -Agree with holding hydrochlorothiazide.  3-Alcohol abuse: No evidence of alcohol withdrawal.  Continue with CIWA protocol.  COVID-19 infection: Thankfully patient without respiratory symptoms. GI symptoms improved. Will avoid Remdesivir  in the setting of transaminases. CRP did not significantly increase. D-dimer significantly elevated but will avoid full dose anticoagulation in the setting of liver failure and prior history of GI bleed. Sat above 95 %. Wont start dexamethasone at this time COVID-19 Labs  Recent Labs    10/04/19 0007 10/04/19 2007 10/05/19 0500  DDIMER  --  9.12* 7.36*  FERRITIN 100 157 372*  LDH  --  228*  --   CRP  --   2.6* 2.7*    Lab Results  Component Value Date   SARSCOV2NAA POSITIVE (A) 10/04/2019   SARSCOV2NAA NOT DETECTED 06/17/2019   Coagulopathy: Likely related to decompensated liver failure. Received vitamin K.  Continue to monitor.  Iron deficiency anemia prior history of GI bleed and alcoholism: Received Fera-heme Continue with PPI Hemoglobin stable  Hypertension: Normotensive hydrochlorothiazide stopped due to AKI.  Hypophosphatemia: Replete IV Severe Hyponatremia; received IV fluids and fluids restriction . Sodium improved from 117 to 133.   History of pancreatic mass; per GI.  Needs follow up.   Acute metabolic encephalopathy; related to alcohol intake, hyponatremia.  Improved. He is alert and oriented times 3.     Estimated body mass index is 29.83 kg/m as calculated from the following:   Height as of this encounter: 5' 10"  (1.778 m).   Weight as of this encounter: 94.3 kg.   DVT prophylaxis: SCD Code Status: Full code Family Communication: care discussed with patient.  Disposition Plan: remian in the hospital for acetylcystein treatment for tylenol toxiocity and liver failure.  Consultants:   GI  Procedures:   none  Antimicrobials:  none  Subjective: Patient is alert and oriented times 3. He denies abdominal pain or dyspnea.  He last use cocaine 1 week ago.    Objective: Vitals:   10/05/19 0000 10/05/19 0452 10/05/19 0503 10/05/19 0850  BP: 123/77 104/66  (!) 141/78  Pulse: (!) 101 (!) 106  93  Resp:  16  19  Temp:  98.6 F (37 C)  98.3 F (36.8 C)  TempSrc:  Oral  Oral  SpO2: 97% 96%  97%  Weight:   94.3 kg   Height:  5' 10"  (1.778 m)     Intake/Output Summary (Last 24 hours) at 10/05/2019 1519 Last data filed at 10/05/2019 0500 Gross per 24 hour  Intake 290.81 ml  Output 1000 ml  Net -709.19 ml   Filed Weights   10/05/19 0503  Weight: 94.3 kg    Examination:  General exam: Appears calm and comfortable  Respiratory system: Clear  to auscultation. Respiratory effort normal. Cardiovascular system: S1 & S2 heard, RRR.  Gastrointestinal system: Abdomen is nondistended, soft and nontender. No organomegaly or masses felt. Normal bowel sounds heard. Central nervous system: Alert and oriented. No focal neurological deficits. Extremities: Symmetric 5 x 5 power. Skin: No rashes, lesions or ulcers Psychiatry: Judgement and insight appear normal. Mood & affect appropriate.     Data Reviewed: I have personally reviewed following labs and imaging studies  CBC: Recent Labs  Lab 10/03/19 1731 10/03/19 1930 10/04/19 0534 10/04/19 2007 10/05/19 0500  WBC 7.6 7.9 7.1 8.9 8.5  NEUTROABS  --  6.1  --  7.2 5.4  HGB 8.8* 8.9* 8.7* 9.3* 9.5*  HCT 27.1* 27.2* 26.6* 28.3* 28.1*  MCV 77.4* 76.8* 77.1* 76.7* 76.6*  PLT 317 302 338 386 937   Basic Metabolic Panel: Recent Labs  Lab 09/30/19 1412 10/03/19 1731 10/03/19 1930 10/04/19 0534 10/04/19 0600 10/05/19 0500  NA 133* 117* 117* 122*  --  132*  K 3.7 3.1* 3.1* 3.3*  --  3.9  CL 101 86* 87* 91*  --  98  CO2 22 19* 18* 22  --  22  GLUCOSE 99 160* 138* 84  --  76  BUN <5* <5* <5* <5*  --  <5*  CREATININE 0.66 1.58* 1.51* 1.27*  --  1.03  CALCIUM 8.3* 7.6* 7.8* 8.1*  --  8.9  MG  --   --   --   --  2.2 2.2  PHOS  --   --   --   --  1.8* 1.8*   GFR: Estimated Creatinine Clearance: 87.9 mL/min (by C-G formula based on SCr of 1.03 mg/dL). Liver Function Tests: Recent Labs  Lab 09/30/19 1655 10/03/19 1731 10/03/19 1930 10/04/19 0534 10/05/19 0500  AST 294* 1,937* 1,811* 1,238* 622*  ALT 106* 1,454* 1,431* 1,210* 1,010*  ALKPHOS 184* 218* 218* 225* 207*  BILITOT 2.2* 2.9* 3.1* 3.5* 3.4*  PROT 7.6 6.4* 6.7 6.4* 7.5  ALBUMIN 3.0* 2.6* 2.6* 2.6* 2.8*   Recent Labs  Lab 09/30/19 1655 10/03/19 1731  LIPASE 22 46   No results for input(s): AMMONIA in the last 168 hours. Coagulation Profile: Recent Labs  Lab 10/04/19 0007 10/05/19 0500  INR 1.7* 1.5*    Cardiac Enzymes: Recent Labs  Lab 10/04/19 0007  CKTOTAL 100   BNP (last 3 results) No results for input(s): PROBNP in the last 8760 hours. HbA1C: No results for input(s): HGBA1C in the last 72 hours. CBG: No results for input(s): GLUCAP in the last 168 hours. Lipid Profile: No results for input(s): CHOL, HDL, LDLCALC, TRIG, CHOLHDL, LDLDIRECT in the last 72 hours. Thyroid Function Tests: No results for input(s): TSH, T4TOTAL, FREET4, T3FREE, THYROIDAB in the last 72 hours. Anemia Panel: Recent Labs    10/04/19 0007 10/04/19 2007 10/05/19 0500  VITAMINB12 3,603*  --   --   FOLATE 24.1  --   --   FERRITIN 100 157 372*  TIBC 293  --   --   IRON 14*  --   --   RETICCTPCT 2.1  --   --  Sepsis Labs: Recent Labs  Lab 10/04/19 2007  PROCALCITON 0.84    Recent Results (from the past 240 hour(s))  SARS CORONAVIRUS 2 (TAT 6-24 HRS) Nasopharyngeal Nasopharyngeal Swab     Status: Abnormal   Collection Time: 10/04/19  2:28 AM   Specimen: Nasopharyngeal Swab  Result Value Ref Range Status   SARS Coronavirus 2 POSITIVE (A) NEGATIVE Final    Comment: RESULT CALLED TO, READ BACK BY AND VERIFIED WITH: K. [place], [facility] ED RN AT [phone] ON 10/04/19 BY C. JESSUP, MT. (NOTE) SARS-CoV-2 target nucleic acids are DETECTED. The SARS-CoV-2 RNA is generally detectable in upper and lower respiratory specimens during the acute phase of infection. Positive results are indicative of the presence of SARS-CoV-2 RNA. Clinical correlation with patient history and other diagnostic information is  necessary to determine patient infection status. Positive results do not rule out bacterial infection or co-infection with other viruses.  The expected result is Negative. Fact Sheet for Patients: SugarRoll.be Fact Sheet for Healthcare Providers: https://www.woods-mathews.com/ This test is not yet approved or cleared by the [place] FDA and  has been authorized  for detection and/or diagnosis of SARS-CoV-2 by FDA under an Emergency Use Authorization (EUA). This EUA will remain  in effect (meaning this test c an be used) for the duration of the COVID-19 declaration under Section 564(b)(1) of the Act, 21 U.S.C. section 360bbb-3(b)(1), unless the authorization is terminated or revoked sooner. Performed at [facility], [place] [address] [place], [place] [address]   MRSA PCR Screening     Status: None   Collection Time: 10/04/19 10:53 PM   Specimen: Nasopharyngeal  Result Value Ref Range Status   MRSA by PCR NEGATIVE NEGATIVE Final    Comment:        The GeneXpert MRSA Assay (FDA approved for NASAL specimens only), is one component of a comprehensive MRSA colonization surveillance program. It is not intended to diagnose MRSA infection nor to guide or monitor treatment for MRSA infections. Performed at [facility], [place] [address], [place], [place] [address]          Radiology Studies: US Liver Doppler  Result Date: 10/04/2019 CLINICAL DATA:  58 year old male with a history of alcohol abuse, hepatomegaly, steatosis and elevated LFTs. EXAM: DUPLEX ULTRASOUND OF LIVER TECHNIQUE: Color and duplex Doppler ultrasound was performed to evaluate the hepatic in-flow and out-flow vessels. COMPARISON:  MR abdomen 12/13/2017 FINDINGS: Liver: The hepatic parenchyma is heterogeneous, echogenic and coarsened. The adjacent renal parenchyma appears hypoechoic in comparison. The hepatic contour is not well seen. No focal lesion, mass or intrahepatic biliary ductal dilatation. Main Portal Vein size: 1.1 cm cm Portal Vein Velocities Main Prox:  46 cm/sec with normal hepatopetal flow. Main Mid: 41 cm/sec  with normal hepatopetal flow. Main Dist:  42 cm/sec  with normal hepatopetal flow. Right: 32 cm/sec Left: 34 cm/sec Hepatic Vein Velocities Right:  42 cm/sec Middle:  28 cm/sec Left:  25 cm/sec IVC: Present and patent with normal respiratory phasicity.  Hepatic Artery Velocity:  122 cm/sec Splenic Vein Velocity:  18 cm/sec Spleen: 7.6 cm x 10.4 cm x 4.0 cm with a total volume of 164 cm^3 (411 cm^3 is upper limit normal) Portal Vein Occlusion/Thrombus: No Splenic Vein Occlusion/Thrombus: No Ascites: None Varices: None IMPRESSION: 1. Patent and unremarkable portal and hepatic veins. No evidence of portal hypertension or venous thrombosis. 2. Diffusely heterogeneous and echogenic liver parenchyma. The sonographic appearance is most consistent with hepatic steatosis. Cirrhosis from non steatotic  causes can have a similar appearance but is usually somewhat less echogenic. 3. No evidence of ascites. Electronically Signed   By: Jacqulynn Cadet M.D.   On: 10/04/2019 08:39        Scheduled Meds:  amLODipine  10 mg Oral q morning - 10a   enoxaparin (LOVENOX) injection  40 mg Subcutaneous Daily   folic acid  1 mg Oral Daily   Ipratropium-Albuterol  1 puff Inhalation Q6H   LORazepam  0-4 mg Intravenous Q6H   Or   LORazepam  0-4 mg Oral Q6H   [START ON 10/06/2019] LORazepam  0-4 mg Intravenous Q12H   Or   [START ON 10/06/2019] LORazepam  0-4 mg Oral Q12H   multivitamin with minerals  1 tablet Oral Daily   pantoprazole  40 mg Oral BID   vitamin C  500 mg Oral Daily   Continuous Infusions:  acetylcysteine 6.25 mg/kg/hr (10/04/19 1923)   sodium phosphate  Dextrose 5% IVPB       LOS: 2 days    Time spent: 35 minutes.     Elmarie Shiley, MD [facility]   If 7PM-7AM, please contact night-coverage [email or web address] Password [facility] 10/05/2019, 3:19 PM

## 2019-10-05 NOTE — Progress Notes (Signed)
Progress Note   Subjective  History obtained from Dr. Tyrell Antonio and nursing staff I spoke with, did not go in room given COVID (+). Mental status stable, no events overnight. LAEs downtrending.   Objective   Vital signs in last 24 hours: Temp:  [97.5 F (36.4 C)-98.6 F (37 C)] 98.3 F (36.8 C) (12/05 0850) Pulse Rate:  [86-107] 93 (12/05 0850) Resp:  [16-26] 19 (12/05 0850) BP: (102-143)/(66-89) 141/78 (12/05 0850) SpO2:  [94 %-100 %] 97 % (12/05 0850) Weight:  [94.3 kg] 94.3 kg (12/05 0503)   Exam not performed - COVID  Intake/Output from previous day: 12/04 0701 - 12/05 0700 In: 290.8 [I.V.:290.8] Out: 1300 [Urine:1300] Intake/Output this shift: No intake/output data recorded.  Lab Results: Recent Labs    10/04/19 0534 10/04/19 2007 10/05/19 0500  WBC 7.1 8.9 8.5  HGB 8.7* 9.3* 9.5*  HCT 26.6* 28.3* 28.1*  PLT 338 386 395   BMET Recent Labs    10/03/19 1930 10/04/19 0534 10/05/19 0500  NA 117* 122* 132*  K 3.1* 3.3* 3.9  CL 87* 91* 98  CO2 18* 22 22  GLUCOSE 138* 84 76  BUN <5* <5* <5*  CREATININE 1.51* 1.27* 1.03  CALCIUM 7.8* 8.1* 8.9   LFT Recent Labs    10/05/19 0500  PROT 7.5  ALBUMIN 2.8*  AST 622*  ALT 1,010*  ALKPHOS 207*  BILITOT 3.4*   PT/INR Recent Labs    10/04/19 0007 10/05/19 0500  LABPROT 20.1* 17.8*  INR 1.7* 1.5*    Studies/Results: US Liver Doppler  Result Date: 10/04/2019 CLINICAL DATA:  58 year old male with a history of alcohol abuse, hepatomegaly, steatosis and elevated LFTs. EXAM: DUPLEX ULTRASOUND OF LIVER TECHNIQUE: Color and duplex Doppler ultrasound was performed to evaluate the hepatic in-flow and out-flow vessels. COMPARISON:  MR abdomen 12/13/2017 FINDINGS: Liver: The hepatic parenchyma is heterogeneous, echogenic and coarsened. The adjacent renal parenchyma appears hypoechoic in comparison. The hepatic contour is not well seen. No focal lesion, mass or intrahepatic biliary ductal dilatation. Main  Portal Vein size: 1.1 cm cm Portal Vein Velocities Main Prox:  46 cm/sec with normal hepatopetal flow. Main Mid: 41 cm/sec  with normal hepatopetal flow. Main Dist:  42 cm/sec  with normal hepatopetal flow. Right: 32 cm/sec Left: 34 cm/sec Hepatic Vein Velocities Right:  42 cm/sec Middle:  28 cm/sec Left:  25 cm/sec IVC: Present and patent with normal respiratory phasicity. Hepatic Artery Velocity:  122 cm/sec Splenic Vein Velocity:  18 cm/sec Spleen: 7.6 cm x 10.4 cm x 4.0 cm with a total volume of 164 cm^3 (411 cm^3 is upper limit normal) Portal Vein Occlusion/Thrombus: No Splenic Vein Occlusion/Thrombus: No Ascites: None Varices: None IMPRESSION: 1. Patent and unremarkable portal and hepatic veins. No evidence of portal hypertension or venous thrombosis. 2. Diffusely heterogeneous and echogenic liver parenchyma. The sonographic appearance is most consistent with hepatic steatosis. Cirrhosis from non steatotic causes can have a similar appearance but is usually somewhat less echogenic. 3. No evidence of ascites. Electronically Signed   By: Jacqulynn Cadet M.D.   On: 10/04/2019 08:39       Assessment / Plan:    58 y/o male with alcoholic liver disease / cirrhosis, ongoing significant alcohol use, admitted with a significant liver injury, AST 1900 / ALT 140s. INR of 1.7 but no mental status changes. To clarify, the liver enzymes are too high for this to be solely due to alcohol, he does not have alcoholic hepatitis. He  has been using Vicodin with his drinking, had a positive tylenol level, I suspect he had an acute liver injury in the setting of tylenol use + significant alcohol. On top of that he has used cocaine in the past week and also COVID 19 positive which could also be contributing. US doppler without thrombosis. Viral hep panel negative, HCV RNA negative. He has been given NAC and would continue that for today. His liver enzymes have continued to downtrend as has INR, his mental status is stable. I  have discussed his case with a Hepatologist colleague who agreed with assessment and plan.    He needs to completely stop using alcohol once out of the hospital, this has been difficult for him to do. Continue supportive care, he is getting better. Call with questions, will follow.  [place] Cellar, MD [facility]

## 2019-10-06 DIAGNOSIS — F10220 Alcohol dependence with intoxication, uncomplicated: Secondary | ICD-10-CM | POA: Diagnosis not present

## 2019-10-06 DIAGNOSIS — R7989 Other specified abnormal findings of blood chemistry: Secondary | ICD-10-CM | POA: Diagnosis not present

## 2019-10-06 LAB — CBC WITH DIFFERENTIAL/PLATELET
Abs Immature Granulocytes: 0.07 10*3/uL (ref 0.00–0.07)
Basophils Absolute: 0.1 10*3/uL (ref 0.0–0.1)
Basophils Relative: 1 %
Eosinophils Absolute: 0.2 10*3/uL (ref 0.0–0.5)
Eosinophils Relative: 2 %
HCT: 24 % — ABNORMAL LOW (ref 39.0–52.0)
Hemoglobin: 7.9 g/dL — ABNORMAL LOW (ref 13.0–17.0)
Immature Granulocytes: 1 %
Lymphocytes Relative: 20 %
Lymphs Abs: 2 10*3/uL (ref 0.7–4.0)
MCH: 25.1 pg — ABNORMAL LOW (ref 26.0–34.0)
MCHC: 32.9 g/dL (ref 30.0–36.0)
MCV: 76.2 fL — ABNORMAL LOW (ref 80.0–100.0)
Monocytes Absolute: 2 10*3/uL — ABNORMAL HIGH (ref 0.1–1.0)
Monocytes Relative: 21 %
Neutro Abs: 5.4 10*3/uL (ref 1.7–7.7)
Neutrophils Relative %: 55 %
Platelets: 338 10*3/uL (ref 150–400)
RBC: 3.15 MIL/uL — ABNORMAL LOW (ref 4.22–5.81)
RDW: 15.2 % (ref 11.5–15.5)
WBC: 9.7 10*3/uL (ref 4.0–10.5)
nRBC: 0.4 % — ABNORMAL HIGH (ref 0.0–0.2)

## 2019-10-06 LAB — PROTIME-INR
INR: 1.4 — ABNORMAL HIGH (ref 0.8–1.2)
Prothrombin Time: 16.8 seconds — ABNORMAL HIGH (ref 11.4–15.2)

## 2019-10-06 LAB — COMPREHENSIVE METABOLIC PANEL
ALT: 683 U/L — ABNORMAL HIGH (ref 0–44)
AST: 269 U/L — ABNORMAL HIGH (ref 15–41)
Albumin: 2.3 g/dL — ABNORMAL LOW (ref 3.5–5.0)
Alkaline Phosphatase: 177 U/L — ABNORMAL HIGH (ref 38–126)
Anion gap: 9 (ref 5–15)
BUN: <5 mg/dL — ABNORMAL LOW (ref 6–20)
CO2: 23 mmol/L (ref 22–32)
Calcium: 8.4 mg/dL — ABNORMAL LOW (ref 8.9–10.3)
Chloride: 100 mmol/L (ref 98–111)
Creatinine, Ser: 1 mg/dL (ref 0.61–1.24)
GFR calc Af Amer: >60 mL/min (ref >60)
GFR calc non Af Amer: >60 mL/min (ref >60)
Glucose, Bld: 107 mg/dL — ABNORMAL HIGH (ref 70–99)
Potassium: 3.3 mmol/L — ABNORMAL LOW (ref 3.5–5.1)
Sodium: 132 mmol/L — ABNORMAL LOW (ref 135–145)
Total Bilirubin: 2 mg/dL — ABNORMAL HIGH (ref 0.3–1.2)
Total Protein: 6.5 g/dL (ref 6.5–8.1)

## 2019-10-06 LAB — MAGNESIUM: Magnesium: 2 mg/dL (ref 1.7–2.4)

## 2019-10-06 LAB — HEMOGLOBIN AND HEMATOCRIT, BLOOD
HCT: 25.1 % — ABNORMAL LOW (ref 39.0–52.0)
HCT: 25.4 % — ABNORMAL LOW (ref 39.0–52.0)
Hemoglobin: 8.2 g/dL — ABNORMAL LOW (ref 13.0–17.0)
Hemoglobin: 8.3 g/dL — ABNORMAL LOW (ref 13.0–17.0)

## 2019-10-06 LAB — FERRITIN: Ferritin: 578 ng/mL — ABNORMAL HIGH (ref 24–336)

## 2019-10-06 LAB — PHOSPHORUS: Phosphorus: 3.9 mg/dL (ref 2.5–4.6)

## 2019-10-06 LAB — C-REACTIVE PROTEIN: CRP: 2.2 mg/dL — ABNORMAL HIGH (ref <1.0)

## 2019-10-06 LAB — D-DIMER, QUANTITATIVE: D-Dimer, Quant: 3.46 ug/mL-FEU — ABNORMAL HIGH (ref 0.00–0.50)

## 2019-10-06 MED ORDER — PANTOPRAZOLE SODIUM 40 MG IV SOLR: 40.0000 mg | Freq: Two times a day (BID) | INTRAVENOUS | Status: DC

## 2019-10-06 MED ORDER — POTASSIUM CHLORIDE CRYS ER 20 MEQ PO TBCR
40.0000 meq | EXTENDED_RELEASE_TABLET | Freq: Once | ORAL | Status: AC
  Administered 2019-10-06: 40 meq via ORAL
  Filled 2019-10-06: qty 2

## 2019-10-06 MED ORDER — VITAMIN K1 10 MG/ML IJ SOLN
10.0000 mg | Freq: Once | INTRAVENOUS | Status: AC
  Administered 2019-10-07: 10 mg via INTRAVENOUS
  Filled 2019-10-06: qty 1

## 2019-10-06 MED ORDER — PANTOPRAZOLE SODIUM 40 MG IV SOLR
40.0000 mg | Freq: Two times a day (BID) | INTRAVENOUS | Status: DC
  Administered 2019-10-06 – 2019-10-08 (×5): 40 mg via INTRAVENOUS
  Filled 2019-10-06 (×5): qty 40

## 2019-10-06 MED ORDER — VITAMIN K1 10 MG/ML IJ SOLN
5.0000 mg | Freq: Once | INTRAVENOUS | Status: AC
  Administered 2019-10-06: 5 mg via INTRAVENOUS
  Filled 2019-10-06: qty 0.5

## 2019-10-06 MED ORDER — SODIUM CHLORIDE 0.9 % IV SOLN
INTRAVENOUS | Status: DC
  Administered 2019-10-06: 12:00:00 via INTRAVENOUS

## 2019-10-06 MED ORDER — SODIUM CHLORIDE 0.9 % IV SOLN
50.0000 ug/h | INTRAVENOUS | Status: AC
  Administered 2019-10-06 – 2019-10-08 (×3): 50 ug/h via INTRAVENOUS
  Filled 2019-10-06: qty 1
  Filled 2019-10-06 (×2): qty 5

## 2019-10-06 NOTE — Progress Notes (Signed)
PROGRESS NOTE    Chad Turner  ZSM:[id number] DOB: 06-24-1961 DOA: 10/03/2019 PCP: Medicine, [facility]   Brief Narrative: 58 year-old with past medical history significant for alcohol abuse, alcoholic cirrhosis?,  Depression, chronic pain syndrome, prostate cancer status post radiotherapy, hypertension who presents on 10/03/2019 with nausea and vomiting for 1 day duration.  Patient reporter 12 beers a day of admission. He was admitted with alcoholic hepatitis, hyponatremia and AKI.  GI was consulted for alcoholic hepatitis.   Assessment & Plan:   Principal Problem:   Alcoholic hepatitis with ascites Active Problems:   Alcohol dependence (HCC)   Cocaine abuse (HCC)   Pancreatic mass   Hypertension   Hypokalemia   Malignant neoplasm of prostate (HCC)   AKI (acute kidney injury) ([place])   Alcoholic hepatitis   1-Acute  liver injury  related to Tylenol use  and alcohol intake -Patient received 2 days of acetylcysteine, due to concern of Tylenol overdose. -Liver function tests trending down. -Might have component of COVID-19 affecting liver function -Acute hepatitis panel negative  2-Melena, acute blood loss anemia: Patient report black bowel movement this morning. Hemoglobin dropped from 9----7.9---8.0 Start IV Protonix twice daily, IV octreotide tried. Monitor hemoglobin, transfusion as needed. INR 1.4.  Will give a dose of vitamin K.  AKI; Creatinine baseline 0.6. -Creatinine on admission 1.5. Continue to trend down. -Agree with holding hydrochlorothiazide.  3-Alcohol abuse: No evidence of alcohol withdrawal.  Continue with CIWA protocol.  COVID-19 infection: Thankfully patient without respiratory symptoms. GI symptoms improved. Will avoid Remdesivir  in the setting of transaminases. CRP did not significantly increase. D-dimer significantly elevated but will avoid full dose anticoagulation in the setting of liver failure and prior history of GI bleed.   D-dimer trending down. Sat above 95 %. Wont start dexamethasone at this time.  We will avoid IV steroids in the setting of GI bleed.  COVID-19 Labs  Recent Labs    10/04/19 2007 10/05/19 0500 10/06/19 0235  DDIMER 9.12* 7.36* 3.46*  FERRITIN 157 372* 578*  LDH 228*  --   --   CRP 2.6* 2.7* 2.2*    Lab Results  Component Value Date   SARSCOV2NAA POSITIVE (A) 10/04/2019   SARSCOV2NAA NOT DETECTED 06/17/2019   Coagulopathy: Likely related to decompensated liver failure. Received vitamin K.  Continue to monitor.  Iron deficiency anemia prior history of GI bleed and alcoholism: Received Fera-heme Hemoglobin stable  Hypertension: Normotensive hydrochlorothiazide stopped due to AKI.   Severe Hyponatremia; received IV fluids and fluids restriction . Sodium improved from 117 to 133.   History of pancreatic mass; per GI.  Needs follow up.   Acute metabolic encephalopathy; related to alcohol intake, hyponatremia.  Improved. He is alert and oriented times 3.   Hypokalemia: Replete orally.  Check mag level Hypophosphatemia: Resolved Estimated body mass index is 29.83 kg/m as calculated from the following:   Height as of this encounter: 5' 10"  (1.778 m).   Weight as of this encounter: 94.3 kg.   DVT prophylaxis: SCD Code Status: Full code Family Communication: care discussed with patient.  Disposition Plan: remian in the hospital for acetylcystein treatment for tylenol toxiocity and liver failure.  Consultants:   GI  Procedures:   none  Antimicrobials:  none  Subjective: Patient is alert and oriented.  He feels well.  He report small bowel movement this morning and black color.  He denies abdominal pain.  He denies chest pain and cough.   Objective: Vitals:  10/05/19 1300 10/05/19 1600 10/06/19 0825 10/06/19 0830  BP: 116/88 (!) 144/81 (!) 153/79 (!) 153/79  Pulse: (!) 107 (!) 102  (!) 104  Resp: (!) 28 17  18   Temp: 98.4 F (36.9 C) 98.9 F (37.2 C)  97.7  F (36.5 C)  TempSrc: Oral Oral  Axillary  SpO2: 99% 100%  100%  Weight:      Height:        Intake/Output Summary (Last 24 hours) at 10/06/2019 0944 Last data filed at 10/05/2019 1631 Gross per 24 hour  Intake 240 ml  Output 300 ml  Net -60 ml   Filed Weights   10/05/19 0503  Weight: 94.3 kg    Examination:  General exam: NAD Respiratory system:CTA Cardiovascular system: S 1, S 2 RRR Gastrointestinal system: BS present, soft, nt Central nervous system: Non focal.  Extremities; Symmetric power.  Skin: No rashes   Data Reviewed: I have personally reviewed following labs and imaging studies  CBC: Recent Labs  Lab 10/03/19 1930 10/04/19 0534 10/04/19 2007 10/05/19 0500 10/06/19 0235  WBC 7.9 7.1 8.9 8.5 9.7  NEUTROABS 6.1  --  7.2 5.4 5.4  HGB 8.9* 8.7* 9.3* 9.5* 7.9*  HCT 27.2* 26.6* 28.3* 28.1* 24.0*  MCV 76.8* 77.1* 76.7* 76.6* 76.2*  PLT 302 338 386 395 758   Basic Metabolic Panel: Recent Labs  Lab 10/03/19 1731 10/03/19 1930 10/04/19 0534 10/04/19 0600 10/05/19 0500 10/06/19 0235  NA 117* 117* 122*  --  132* 132*  K 3.1* 3.1* 3.3*  --  3.9 3.3*  CL 86* 87* 91*  --  98 100  CO2 19* 18* 22  --  22 23  GLUCOSE 160* 138* 84  --  76 107*  BUN <5* <5* <5*  --  <5* <5*  CREATININE 1.58* 1.51* 1.27*  --  1.03 1.00  CALCIUM 7.6* 7.8* 8.1*  --  8.9 8.4*  MG  --   --   --  2.2 2.2 2.0  PHOS  --   --   --  1.8* 1.8* 3.9   GFR: Estimated Creatinine Clearance: 90.6 mL/min (by C-G formula based on SCr of 1 mg/dL). Liver Function Tests: Recent Labs  Lab 10/03/19 1731 10/03/19 1930 10/04/19 0534 10/05/19 0500 10/06/19 0235  AST 1,937* 1,811* 1,238* 622* 269*  ALT 1,454* 1,431* 1,210* 1,010* 683*  ALKPHOS 218* 218* 225* 207* 177*  BILITOT 2.9* 3.1* 3.5* 3.4* 2.0*  PROT 6.4* 6.7 6.4* 7.5 6.5  ALBUMIN 2.6* 2.6* 2.6* 2.8* 2.3*   Recent Labs  Lab 09/30/19 1655 10/03/19 1731  LIPASE 22 46   No results for input(s): AMMONIA in the last 168 hours.  Coagulation Profile: Recent Labs  Lab 10/04/19 0007 10/05/19 0500  INR 1.7* 1.5*   Cardiac Enzymes: Recent Labs  Lab 10/04/19 0007  CKTOTAL 100   BNP (last 3 results) No results for input(s): PROBNP in the last 8760 hours. HbA1C: No results for input(s): HGBA1C in the last 72 hours. CBG: No results for input(s): GLUCAP in the last 168 hours. Lipid Profile: No results for input(s): CHOL, HDL, LDLCALC, TRIG, CHOLHDL, LDLDIRECT in the last 72 hours. Thyroid Function Tests: No results for input(s): TSH, T4TOTAL, FREET4, T3FREE, THYROIDAB in the last 72 hours. Anemia Panel: Recent Labs    10/04/19 0007  10/05/19 0500 10/06/19 0235  [id number] 3,603*  --   --   --   FOLATE 24.1  --   --   --   FERRITIN 100   < >  372* 578*  TIBC 293  --   --   --   IRON 14*  --   --   --   RETICCTPCT 2.1  --   --   --    < > = values in this interval not displayed.   Sepsis Labs: Recent Labs  Lab 10/04/19 2007  PROCALCITON 0.84    Recent Results (from the past 240 hour(s))  SARS CORONAVIRUS 2 (TAT 6-24 HRS) Nasopharyngeal Nasopharyngeal Swab     Status: Abnormal   Collection Time: 10/04/19  2:28 AM   Specimen: Nasopharyngeal Swab  Result Value Ref Range Status   SARS Coronavirus 2 POSITIVE (A) NEGATIVE Final    Comment: RESULT CALLED TO, READ BACK BY AND VERIFIED WITH: K. [place], [facility] ED RN AT [phone] ON 10/04/19 BY C. JESSUP, MT. (NOTE) SARS-CoV-2 target nucleic acids are DETECTED. The SARS-CoV-2 RNA is generally detectable in upper and lower respiratory specimens during the acute phase of infection. Positive results are indicative of the presence of SARS-CoV-2 RNA. Clinical correlation with patient history and other diagnostic information is  necessary to determine patient infection status. Positive results do not rule out bacterial infection or co-infection with other viruses.  The expected result is Negative. Fact Sheet for Patients: SugarRoll.be Fact  Sheet for Healthcare Providers: https://www.woods-mathews.com/ This test is not yet approved or cleared by the [place] FDA and  has been authorized for detection and/or diagnosis of SARS-CoV-2 by FDA under an Emergency Use Authorization (EUA). This EUA will remain  in effect (meaning this test c an be used) for the duration of the COVID-19 declaration under Section 564(b)(1) of the Act, 21 U.S.C. section 360bbb-3(b)(1), unless the authorization is terminated or revoked sooner. Performed at [facility], [place] [address] [place], [place] [address]   MRSA PCR Screening     Status: None   Collection Time: 10/04/19 10:53 PM   Specimen: Nasopharyngeal  Result Value Ref Range Status   MRSA by PCR NEGATIVE NEGATIVE Final    Comment:        The GeneXpert MRSA Assay (FDA approved for NASAL specimens only), is one component of a comprehensive MRSA colonization surveillance program. It is not intended to diagnose MRSA infection nor to guide or monitor treatment for MRSA infections. Performed at [facility], [place] [address] [place], [place] [address]          Radiology Studies: No results found.      Scheduled Meds: . amLODipine  10 mg Oral q morning - 86N  . folic acid  1 mg Oral Daily  . Ipratropium-Albuterol  1 puff Inhalation Q6H  . LORazepam  0-4 mg Intravenous Q12H   Or  . LORazepam  0-4 mg Oral Q12H  . multivitamin with minerals  1 tablet Oral Daily  . pantoprazole  40 mg Oral BID  . vitamin C  500 mg Oral Daily   Continuous Infusions:    LOS: 3 days    Time spent: 35 minutes.     Elmarie Shiley, MD [facility]   If 7PM-7AM, please contact night-coverage [email or web address] Password [facility] 10/06/2019, 9:44 AM

## 2019-10-06 NOTE — Progress Notes (Addendum)
Progress Note   Subjective  No acute events reported overnight. NAC continued. Hgb drifted down - nursing states no evidence of any GI bleeding, no bowel movements.    Objective   Vital signs in last 24 hours: Temp:  [97.7 F (36.5 C)-98.9 F (37.2 C)] 97.7 F (36.5 C) (12/06 0830) Pulse Rate:  [102-107] 104 (12/06 0830) Resp:  [17-28] 18 (12/06 0830) BP: (116-153)/(79-88) 153/79 (12/06 0830) SpO2:  [99 %-100 %] 100 % (12/06 0830)   Patient not examined - COVID (+)  Intake/Output from previous day: 12/05 0701 - 12/06 0700 In: 240 [P.O.:240] Out: 300 [Urine:300] Intake/Output this shift: No intake/output data recorded.  Lab Results: Recent Labs    10/04/19 2007 10/05/19 0500 10/06/19 0235  WBC 8.9 8.5 9.7  HGB 9.3* 9.5* 7.9*  HCT 28.3* 28.1* 24.0*  PLT 386 395 338   BMET Recent Labs    10/04/19 0534 10/05/19 0500 10/06/19 0235  NA 122* 132* 132*  K 3.3* 3.9 3.3*  CL 91* 98 100  CO2 22 22 23   GLUCOSE 84 76 107*  BUN <5* <5* <5*  CREATININE 1.27* 1.03 1.00  CALCIUM 8.1* 8.9 8.4*   LFT Recent Labs    10/06/19 0235  PROT 6.5  ALBUMIN 2.3*  AST 269*  ALT 683*  ALKPHOS 177*  BILITOT 2.0*   PT/INR Recent Labs    10/04/19 0007 10/05/19 0500  LABPROT 20.1* 17.8*  INR 1.7* 1.5*       Assessment / Plan:    Acute liver injury - suspect due to tylenol + heavy alcohol use, also with history of cocaine use and COVID (+), may be multifactorial. To clarify, he does NOT have alcoholic hepatitis  58 y/o male with alcoholic liver disease / cirrhosis, ongoing significant alcohol use, admitted with a significant liver injury, AST 1900 / ALT 140s. INR of 1.7 but no mental status changes. Liver enzymes were too high for this to be alcoholic hepatitis or just due to alcohol in itself, I suspect alcohol + Vicodin could have led to tylenol injury, also with recent cocaine use and COVID (+) - multifactorial. US doppler without thrombosis. Viral hep panel  negative, HCV RNA negative.   He's had 2 days of NAC and enzymes are downtrending nicely, no mental status changes, INR coming down but would repeat that today. Tylenol level negative.  Of note, Hgb has drifted but he's had no evidence of bleeding symptoms, would monitor. BUN normal. Recent colonoscopy with Dr. Collene Mares / [facility].  Recommend: - okay to stop NAC today - continue to trend enzymes daily, INR - he needs complete alcohol / substance use abstinence moving forward given his underlying liver disease, although has not shown he has been able to do this in the past. Alcohol rehab if he is willing - if enzymes continue to improve downtrend tomorrow I think can be discharged home from liver perspective - we will coordinate follow up in the office, Dr. Hilarie Fredrickson, following his discharge. - plan for repeat LFTs in the office next week following discharge. - he should not be on a statin with suspected cirrhosis of the liver with recent liver injury, please continue it as outpatient  Will sign off for now, please call with questions moving forward or changes in his status.  [place] Cellar, MD [facility]   ADDENDUM: Contacted by Dr. Tyrell Antonio, patient just passed a small amount of suspected melena. Will start IV protonix and would also start octreotide as  well given his liver disease just in case this worsens. Given COVID status would hold off on endoscopy urgently unless patient is having significant bleeding. He does not have any ascites that we known of in regards to SBP prophylaxis. He is otherwise stable. We will continue to follow

## 2019-10-07 DIAGNOSIS — B179 Acute viral hepatitis, unspecified: Secondary | ICD-10-CM

## 2019-10-07 DIAGNOSIS — R195 Other fecal abnormalities: Secondary | ICD-10-CM

## 2019-10-07 DIAGNOSIS — D649 Anemia, unspecified: Secondary | ICD-10-CM | POA: Diagnosis not present

## 2019-10-07 DIAGNOSIS — U071 COVID-19: Secondary | ICD-10-CM

## 2019-10-07 LAB — FERRITIN: Ferritin: 738 ng/mL — ABNORMAL HIGH (ref 24–336)

## 2019-10-07 LAB — CBC WITH DIFFERENTIAL/PLATELET
Abs Immature Granulocytes: 0.12 10*3/uL — ABNORMAL HIGH (ref 0.00–0.07)
Basophils Absolute: 0.1 10*3/uL (ref 0.0–0.1)
Basophils Relative: 1 %
Eosinophils Absolute: 0.2 10*3/uL (ref 0.0–0.5)
Eosinophils Relative: 2 %
HCT: 25.8 % — ABNORMAL LOW (ref 39.0–52.0)
Hemoglobin: 8.3 g/dL — ABNORMAL LOW (ref 13.0–17.0)
Immature Granulocytes: 1 %
Lymphocytes Relative: 20 %
Lymphs Abs: 2 10*3/uL (ref 0.7–4.0)
MCH: 25.5 pg — ABNORMAL LOW (ref 26.0–34.0)
MCHC: 32.2 g/dL (ref 30.0–36.0)
MCV: 79.1 fL — ABNORMAL LOW (ref 80.0–100.0)
Monocytes Absolute: 1.9 10*3/uL — ABNORMAL HIGH (ref 0.1–1.0)
Monocytes Relative: 18 %
Neutro Abs: 5.8 10*3/uL (ref 1.7–7.7)
Neutrophils Relative %: 58 %
Platelets: 350 10*3/uL (ref 150–400)
RBC: 3.26 MIL/uL — ABNORMAL LOW (ref 4.22–5.81)
RDW: 15.6 % — ABNORMAL HIGH (ref 11.5–15.5)
WBC: 10.1 10*3/uL (ref 4.0–10.5)
nRBC: 0.6 % — ABNORMAL HIGH (ref 0.0–0.2)

## 2019-10-07 LAB — COMPREHENSIVE METABOLIC PANEL
ALT: 467 U/L — ABNORMAL HIGH (ref 0–44)
AST: 130 U/L — ABNORMAL HIGH (ref 15–41)
Albumin: 2.3 g/dL — ABNORMAL LOW (ref 3.5–5.0)
Alkaline Phosphatase: 172 U/L — ABNORMAL HIGH (ref 38–126)
Anion gap: 8 (ref 5–15)
BUN: <5 mg/dL — ABNORMAL LOW (ref 6–20)
CO2: 23 mmol/L (ref 22–32)
Calcium: 8.5 mg/dL — ABNORMAL LOW (ref 8.9–10.3)
Chloride: 103 mmol/L (ref 98–111)
Creatinine, Ser: 0.85 mg/dL (ref 0.61–1.24)
GFR calc Af Amer: >60 mL/min (ref >60)
GFR calc non Af Amer: >60 mL/min (ref >60)
Glucose, Bld: 117 mg/dL — ABNORMAL HIGH (ref 70–99)
Potassium: 4.3 mmol/L (ref 3.5–5.1)
Sodium: 134 mmol/L — ABNORMAL LOW (ref 135–145)
Total Bilirubin: 1.7 mg/dL — ABNORMAL HIGH (ref 0.3–1.2)
Total Protein: 6.7 g/dL (ref 6.5–8.1)

## 2019-10-07 LAB — PHOSPHORUS: Phosphorus: 3.6 mg/dL (ref 2.5–4.6)

## 2019-10-07 LAB — MAGNESIUM: Magnesium: 1.9 mg/dL (ref 1.7–2.4)

## 2019-10-07 MED ORDER — CIPROFLOXACIN HCL 500 MG PO TABS
500.0000 mg | ORAL_TABLET | Freq: Two times a day (BID) | ORAL | Status: DC
  Administered 2019-10-07 – 2019-10-08 (×2): 500 mg via ORAL
  Filled 2019-10-07 (×2): qty 1

## 2019-10-07 NOTE — Progress Notes (Signed)
PROGRESS NOTE    Chad Turner  BSJ:[id number] DOB: 1960-12-12 DOA: 10/03/2019 PCP: Medicine, [facility]   Brief Narrative: 58 year-old with past medical history significant for alcohol abuse, alcoholic cirrhosis?,  Depression, chronic pain syndrome, prostate cancer status post radiotherapy, hypertension who presents on 10/03/2019 with nausea and vomiting for 1 day duration.  Patient reporter 12 beers a day of admission. He was admitted with alcoholic hepatitis, hyponatremia and AKI.  GI was consulted for alcoholic hepatitis.   Assessment & Plan:   Principal Problem:   COVID-19 virus infection Active Problems:   Alcohol dependence ([place])   Cocaine abuse ([place])   Pancreatic mass   Hypertension   Hypokalemia   Malignant neoplasm of prostate (HCC)   AKI (acute kidney injury) ([place])   Alcoholic hepatitis   Acute hepatitis   1-Acute  liver injury  related to Tylenol use  and alcohol intake -Patient received 2 days of acetylcysteine, due to concern of Tylenol overdose. -Liver function tests trending down. -Might have component of COVID-19 affecting liver function -Acute hepatitis panel negative  2-Melena, acute blood loss anemia: Patient report black bowel movement this morning. Hemoglobin dropped from 9----7.9---8.0 Continue with  IV Protonix twice daily, IV octreotide  Monitor hemoglobin, transfusion as needed. INR 1.4.  Received vitamin K.  Hb remain stable. No further melena.  Plan to resume diet and monitor hb in 24 hours.  Plan to discontinue octreotide 12-08  AKI; Creatinine baseline 0.6. -Creatinine on admission 1.5. Continue to trend down. -Agree with holding hydrochlorothiazide.  3-Alcohol abuse: No evidence of alcohol withdrawal.  Continue with CIWA protocol.  COVID-19 infection: Thankfully patient without respiratory symptoms. GI symptoms improved. Will avoid Remdesivir  in the setting of transaminases. CRP did not significantly increase.  D-dimer significantly elevated but will avoid full dose anticoagulation in the setting of liver failure and prior history of GI bleed.  D-dimer trending down. Sat above 95 %. Wont start dexamethasone at this time.  We will avoid IV steroids in the setting of GI bleed.  COVID-19 Labs  Recent Labs    10/04/19 2007 10/05/19 0500 10/06/19 0235 10/07/19 0500  DDIMER 9.12* 7.36* 3.46*  --   FERRITIN 157 372* 578* 738*  LDH 228*  --   --   --   CRP 2.6* 2.7* 2.2*  --     Lab Results  Component Value Date   SARSCOV2NAA POSITIVE (A) 10/04/2019   SARSCOV2NAA NOT DETECTED 06/17/2019   Coagulopathy: Likely related to decompensated liver failure. Received vitamin K.  Continue to monitor.  Iron deficiency anemia prior history of GI bleed and alcoholism: Received Fera-heme Hemoglobin stable  Hypertension: Normotensive hydrochlorothiazide stopped due to AKI.   Severe Hyponatremia; received IV fluids and fluids restriction . Sodium improved from 117 to 133.   History of pancreatic mass; per GI.  Needs follow up.   Acute metabolic encephalopathy; related to alcohol intake, hyponatremia.  Improved. He is alert and oriented times 3.   Hypokalemia: Resolved.   Hypophosphatemia: Resolved Estimated body mass index is 29.83 kg/m as calculated from the following:   Height as of this encounter: 5' 10"  (1.778 m).   Weight as of this encounter: 94.3 kg.   DVT prophylaxis: SCD Code Status: Full code Family Communication: care discussed with patient.  Disposition Plan: remian in the hospital for acetylcystein treatment for tylenol toxiocity and liver failure.  Consultants:   GI  Procedures:   none  Antimicrobials:  none  Subjective: He denies further  melena.  Feeling ok, denies cough, dyspnea.    Objective: Vitals:   10/06/19 1300 10/06/19 1956 10/06/19 2011 10/07/19 0600  BP: 140/89   134/86  Pulse: 99 94    Resp: 20     Temp: 98.8 F (37.1 C)  98.2 F (36.8 C) 98.2  F (36.8 C)  TempSrc: Axillary  Oral Oral  SpO2: 95%   95%  Weight:      Height:        Intake/Output Summary (Last 24 hours) at 10/07/2019 1353 Last data filed at 10/06/2019 1843 Gross per 24 hour  Intake 901.58 ml  Output 300 ml  Net 601.58 ml   Filed Weights   10/05/19 0503  Weight: 94.3 kg    Examination:  General exam: NAD Respiratory system:CTA Cardiovascular system: S 1, S 2 RRR Gastrointestinal system: BS present, soft, nt Central nervous system; non focal.  Extremities; Symmetric power Skin: No rashes   Data Reviewed: I have personally reviewed following labs and imaging studies  CBC: Recent Labs  Lab 10/03/19 1930 10/04/19 0534 10/04/19 2007 10/05/19 0500 10/06/19 0235 10/06/19 1032 10/06/19 1852 10/07/19 0500  WBC 7.9 7.1 8.9 8.5 9.7  --   --  10.1  NEUTROABS 6.1  --  7.2 5.4 5.4  --   --  5.8  HGB 8.9* 8.7* 9.3* 9.5* 7.9* 8.2* 8.3* 8.3*  HCT 27.2* 26.6* 28.3* 28.1* 24.0* 25.1* 25.4* 25.8*  MCV 76.8* 77.1* 76.7* 76.6* 76.2*  --   --  79.1*  PLT 302 338 386 395 338  --   --  737   Basic Metabolic Panel: Recent Labs  Lab 10/03/19 1930 10/04/19 0534 10/04/19 0600 10/05/19 0500 10/06/19 0235 10/07/19 0500  NA 117* 122*  --  132* 132* 134*  K 3.1* 3.3*  --  3.9 3.3* 4.3  CL 87* 91*  --  98 100 103  CO2 18* 22  --  22 23 23   GLUCOSE 138* 84  --  76 107* 117*  BUN <5* <5*  --  <5* <5* <5*  CREATININE 1.51* 1.27*  --  1.03 1.00 0.85  CALCIUM 7.8* 8.1*  --  8.9 8.4* 8.5*  MG  --   --  2.2 2.2 2.0 1.9  PHOS  --   --  1.8* 1.8* 3.9 3.6   GFR: Estimated Creatinine Clearance: 106.5 mL/min (by C-G formula based on SCr of 0.85 mg/dL). Liver Function Tests: Recent Labs  Lab 10/03/19 1930 10/04/19 0534 10/05/19 0500 10/06/19 0235 10/07/19 0500  AST 1,811* 1,238* 622* 269* 130*  ALT 1,431* 1,210* 1,010* 683* 467*  ALKPHOS 218* 225* 207* 177* 172*  BILITOT 3.1* 3.5* 3.4* 2.0* 1.7*  PROT 6.7 6.4* 7.5 6.5 6.7  ALBUMIN 2.6* 2.6* 2.8* 2.3* 2.3*    Recent Labs  Lab 09/30/19 1655 10/03/19 1731  LIPASE 22 46   No results for input(s): AMMONIA in the last 168 hours. Coagulation Profile: Recent Labs  Lab 10/04/19 0007 10/05/19 0500 10/06/19 1032  INR 1.7* 1.5* 1.4*   Cardiac Enzymes: Recent Labs  Lab 10/04/19 0007  CKTOTAL 100   BNP (last 3 results) No results for input(s): PROBNP in the last 8760 hours. HbA1C: No results for input(s): HGBA1C in the last 72 hours. CBG: No results for input(s): GLUCAP in the last 168 hours. Lipid Profile: No results for input(s): CHOL, HDL, LDLCALC, TRIG, CHOLHDL, LDLDIRECT in the last 72 hours. Thyroid Function Tests: No results for input(s): TSH, T4TOTAL, FREET4, T3FREE, THYROIDAB in the  last 72 hours. Anemia Panel: Recent Labs    10/06/19 0235 10/07/19 0500  FERRITIN 578* 738*   Sepsis Labs: Recent Labs  Lab 10/04/19 2007  PROCALCITON 0.84    Recent Results (from the past 240 hour(s))  SARS CORONAVIRUS 2 (TAT 6-24 HRS) Nasopharyngeal Nasopharyngeal Swab     Status: Abnormal   Collection Time: 10/04/19  2:28 AM   Specimen: Nasopharyngeal Swab  Result Value Ref Range Status   SARS Coronavirus 2 POSITIVE (A) NEGATIVE Final    Comment: RESULT CALLED TO, READ BACK BY AND VERIFIED WITH: K. [place], [facility] ED RN AT [phone] ON 10/04/19 BY C. JESSUP, MT. (NOTE) SARS-CoV-2 target nucleic acids are DETECTED. The SARS-CoV-2 RNA is generally detectable in upper and lower respiratory specimens during the acute phase of infection. Positive results are indicative of the presence of SARS-CoV-2 RNA. Clinical correlation with patient history and other diagnostic information is  necessary to determine patient infection status. Positive results do not rule out bacterial infection or co-infection with other viruses.  The expected result is Negative. Fact Sheet for Patients: SugarRoll.be Fact Sheet for Healthcare Providers: https://www.woods-mathews.com/  This test is not yet approved or cleared by the [place] FDA and  has been authorized for detection and/or diagnosis of SARS-CoV-2 by FDA under an Emergency Use Authorization (EUA). This EUA will remain  in effect (meaning this test c an be used) for the duration of the COVID-19 declaration under Section 564(b)(1) of the Act, 21 U.S.C. section 360bbb-3(b)(1), unless the authorization is terminated or revoked sooner. Performed at [facility], [place] [address] [place], [place] [address]   MRSA PCR Screening     Status: None   Collection Time: 10/04/19 10:53 PM   Specimen: Nasopharyngeal  Result Value Ref Range Status   MRSA by PCR NEGATIVE NEGATIVE Final    Comment:        The GeneXpert MRSA Assay (FDA approved for NASAL specimens only), is one component of a comprehensive MRSA colonization surveillance program. It is not intended to diagnose MRSA infection nor to guide or monitor treatment for MRSA infections. Performed at [facility], [place] [address] [place], [place] [address]          Radiology Studies: No results found.      Scheduled Meds: . amLODipine  10 mg Oral q morning - 19E  . folic acid  1 mg Oral Daily  . Ipratropium-Albuterol  1 puff Inhalation Q6H  . LORazepam  0-4 mg Intravenous Q12H   Or  . LORazepam  0-4 mg Oral Q12H  . multivitamin with minerals  1 tablet Oral Daily  . pantoprazole (PROTONIX) IV  40 mg Intravenous Q12H  . vitamin C  500 mg Oral Daily   Continuous Infusions: . sodium chloride 50 mL/hr at 10/06/19 1219  . octreotide  (SANDOSTATIN)    IV infusion Stopped (10/06/19 2040)     LOS: 4 days    Time spent: 35 minutes.     Elmarie Shiley, MD [facility]   If 7PM-7AM, please contact night-coverage [email or web address] Password [email or web address] 10/07/2019, 1:53 PM

## 2019-10-07 NOTE — Progress Notes (Addendum)
Daily Rounding Note  10/07/2019, 9:57 AM  LOS: 4 days   SUBJECTIVE:   Chief complaint:  Acute liver injury.  ??? Melenic stool x 1.     No acute events overnight.  No further stools or melena.    OBJECTIVE:         Vital signs in last 24 hours:    Temp:  [98.2 F (36.8 C)-98.8 F (37.1 C)] 98.2 F (36.8 C) (12/07 0600) Pulse Rate:  [94-99] 94 (12/06 1956) Resp:  [20] 20 (12/06 1300) BP: (134-140)/(86-89) 134/86 (12/07 0600) SpO2:  [95 %] 95 % (12/07 0600) Last BM Date: 10/06/19 Filed Weights   10/05/19 0503  Weight: 94.3 kg   Pt not examined.     Intake/Output from previous day: 12/06 0701 - 12/07 0700 In: 901.6 [I.V.:851.6; IV Piggyback:50] Out: 850 [Urine:850]  Intake/Output this shift: No intake/output data recorded.  Lab Results: Recent Labs    10/05/19 0500 10/06/19 0235 10/06/19 1032 10/06/19 1852 10/07/19 0500  WBC 8.5 9.7  --   --  10.1  HGB 9.5* 7.9* 8.2* 8.3* 8.3*  HCT 28.1* 24.0* 25.1* 25.4* 25.8*  PLT 395 338  --   --  350   BMET Recent Labs    10/05/19 0500 10/06/19 0235 10/07/19 0500  NA 132* 132* 134*  K 3.9 3.3* 4.3  CL 98 100 103  CO2 22 23 23   GLUCOSE 76 107* 117*  BUN <5* <5* <5*  CREATININE 1.03 1.00 0.85  CALCIUM 8.9 8.4* 8.5*   LFT Recent Labs    10/05/19 0500 10/06/19 0235 10/07/19 0500  PROT 7.5 6.5 6.7  ALBUMIN 2.8* 2.3* 2.3*  AST 622* 269* 130*  ALT 1,010* 683* 467*  ALKPHOS 207* 177* 172*  BILITOT 3.4* 2.0* 1.7*   PT/INR Recent Labs    10/05/19 0500 10/06/19 1032  LABPROT 17.8* 16.8*  INR 1.5* 1.4*   Hepatitis Panel No results for input(s): HEPBSAG, HCVAB, HEPAIGM, HEPBIGM in the last 72 hours.  Studies/Results: No results found.   Scheduled Meds: . amLODipine  10 mg Oral q morning - [id number]  . folic acid  1 mg Oral Daily  . Ipratropium-Albuterol  1 puff Inhalation Q6H  . LORazepam  0-4 mg Intravenous Q12H   Or  . LORazepam  0-4 mg Oral  Q12H  . multivitamin with minerals  1 tablet Oral Daily  . pantoprazole (PROTONIX) IV  40 mg Intravenous Q12H  . vitamin C  500 mg Oral Daily   Continuous Infusions: . sodium chloride 50 mL/hr at 10/06/19 1219  . octreotide  (SANDOSTATIN)    IV infusion Stopped (10/06/19 2040)   PRN Meds:.guaiFENesin-dextromethorphan, ondansetron **OR** ondansetron (ZOFRAN) IV   ASSESMENT:   *    Acute liver injury in alcoholic.  Suspect apap and etoh injury.  Likely cirrhosis.     Viral hepatitis serologies negative.   Mucomyst 12/4.   LFTs dramatically improved.     *   Covid 19 positive.   No acute lung changes on cxr of 11/30.  CT 11/30 w enlarged  paratracheal lymph nodes, bil LL opacities favoring atx. Mild R LL airway thickening.     *   Dark stool, ? Melena.  No EGD due to Covid 19 + and HD stable. Chest CT w stranding/edema in paraesophageal adipose tissue of lower thorax, to a lesser degree along small right gastric lymph nodes just below the hiatus. Possibilities include local gastroesophageal inflammation, or  possibly irritation from a sliding HH which is currently reduced. Octreotide day  2.   BID IV Protonix day 2.     *    Anemia.  Hgb stable.   Has not required PRBC.  Feraheme 12/4.    Low iron, low iron sat.  Ferritin 100 at arrival.    *   Hyponatremia.   Na 117 >> 134.    *   Coagulopathy, INR 1.7 >> 1.4.  Vit K IV on 12/4, 12/6, 12/7.      PLAN   *   Would not resart statin in setting of suspected cirrhosis.    *   Regular diet.     *   CBC in AM.     *   Stop Octreotide tmrw 12/8 at 0900.     *   Lifetime ETOH abstinence     Chad Turner  10/07/2019, 9:57 AM Phone [phone]

## 2019-10-08 DIAGNOSIS — U071 COVID-19: Secondary | ICD-10-CM

## 2019-10-08 LAB — CBC WITH DIFFERENTIAL/PLATELET
Abs Immature Granulocytes: 0.11 10*3/uL — ABNORMAL HIGH (ref 0.00–0.07)
Basophils Absolute: 0.1 10*3/uL (ref 0.0–0.1)
Basophils Relative: 1 %
Eosinophils Absolute: 0.3 10*3/uL (ref 0.0–0.5)
Eosinophils Relative: 2 %
HCT: 28.6 % — ABNORMAL LOW (ref 39.0–52.0)
Hemoglobin: 8.9 g/dL — ABNORMAL LOW (ref 13.0–17.0)
Immature Granulocytes: 1 %
Lymphocytes Relative: 22 %
Lymphs Abs: 2.5 10*3/uL (ref 0.7–4.0)
MCH: 25.6 pg — ABNORMAL LOW (ref 26.0–34.0)
MCHC: 31.1 g/dL (ref 30.0–36.0)
MCV: 82.4 fL (ref 80.0–100.0)
Monocytes Absolute: 2.2 10*3/uL — ABNORMAL HIGH (ref 0.1–1.0)
Monocytes Relative: 19 %
Neutro Abs: 6.4 10*3/uL (ref 1.7–7.7)
Neutrophils Relative %: 55 %
Platelets: 378 10*3/uL (ref 150–400)
RBC: 3.47 MIL/uL — ABNORMAL LOW (ref 4.22–5.81)
RDW: 16.5 % — ABNORMAL HIGH (ref 11.5–15.5)
WBC: 11.5 10*3/uL — ABNORMAL HIGH (ref 4.0–10.5)
nRBC: 0.4 % — ABNORMAL HIGH (ref 0.0–0.2)

## 2019-10-08 LAB — COMPREHENSIVE METABOLIC PANEL
ALT: 363 U/L — ABNORMAL HIGH (ref 0–44)
AST: 90 U/L — ABNORMAL HIGH (ref 15–41)
Albumin: 2.5 g/dL — ABNORMAL LOW (ref 3.5–5.0)
Alkaline Phosphatase: 163 U/L — ABNORMAL HIGH (ref 38–126)
Anion gap: 11 (ref 5–15)
BUN: <5 mg/dL — ABNORMAL LOW (ref 6–20)
CO2: 21 mmol/L — ABNORMAL LOW (ref 22–32)
Calcium: 8.7 mg/dL — ABNORMAL LOW (ref 8.9–10.3)
Chloride: 102 mmol/L (ref 98–111)
Creatinine, Ser: 0.84 mg/dL (ref 0.61–1.24)
GFR calc Af Amer: >60 mL/min (ref >60)
GFR calc non Af Amer: >60 mL/min (ref >60)
Glucose, Bld: 101 mg/dL — ABNORMAL HIGH (ref 70–99)
Potassium: 4.2 mmol/L (ref 3.5–5.1)
Sodium: 134 mmol/L — ABNORMAL LOW (ref 135–145)
Total Bilirubin: 1.5 mg/dL — ABNORMAL HIGH (ref 0.3–1.2)
Total Protein: 6.9 g/dL (ref 6.5–8.1)

## 2019-10-08 LAB — FERRITIN: Ferritin: 605 ng/mL — ABNORMAL HIGH (ref 24–336)

## 2019-10-08 LAB — PHOSPHORUS: Phosphorus: 3.8 mg/dL (ref 2.5–4.6)

## 2019-10-08 LAB — MAGNESIUM: Magnesium: 1.9 mg/dL (ref 1.7–2.4)

## 2019-10-08 MED ORDER — CIPROFLOXACIN HCL 500 MG PO TABS: 500.0000 mg | ORAL_TABLET | Freq: Two times a day (BID) | ORAL | 0 refills | Status: AC

## 2019-10-08 MED ORDER — FOLIC ACID 1 MG PO TABS: 1.0000 mg | ORAL_TABLET | Freq: Every day | ORAL | 0 refills | Status: DC

## 2019-10-08 MED ORDER — GUAIFENESIN-DM 100-10 MG/5ML PO SYRP: 10.0000 mL | ORAL_SOLUTION | ORAL | 0 refills | Status: DC | PRN

## 2019-10-08 MED ORDER — NADOLOL 20 MG PO TABS: 10.0000 mg | ORAL_TABLET | Freq: Every day | ORAL | 0 refills | Status: DC

## 2019-10-08 MED ORDER — IPRATROPIUM-ALBUTEROL 20-100 MCG/ACT IN AERS: 1.0000 | INHALATION_SPRAY | Freq: Four times a day (QID) | RESPIRATORY_TRACT | 0 refills | Status: DC

## 2019-10-08 MED ORDER — NADOLOL 20 MG PO TABS
10.0000 mg | ORAL_TABLET | Freq: Every day | ORAL | Status: DC
  Filled 2019-10-08: qty 1

## 2019-10-08 MED ORDER — PANTOPRAZOLE SODIUM 40 MG PO TBEC: 40.0000 mg | DELAYED_RELEASE_TABLET | Freq: Two times a day (BID) | ORAL | 1 refills | Status: DC

## 2019-10-08 NOTE — Progress Notes (Signed)
Daily Rounding Note  10/08/2019, 8:29 AM  LOS: 5 days   SUBJECTIVE:   Chief complaint:     Small brown stool this AM.  Tolerating regular diet w/o issues.   Vital signs stable.   Remains on Covid contact precautions.  OBJECTIVE:         Vital signs in last 24 hours:    Temp:  [97.8 F (36.6 C)-98.9 F (37.2 C)] 98.9 F (37.2 C) (12/08 0622) Pulse Rate:  [85-100] 85 (12/08 0622) Resp:  [20-22] 22 (12/07 2007) BP: (106-161)/(53-89) 106/53 (12/08 0622) SpO2:  [98 %-100 %] 98 % (12/07 2007) Last BM Date: 10/07/19 Filed Weights   10/05/19 0503  Weight: 94.3 kg   Did not re-examine pt.  Spoke w pt RN  Intake/Output from previous day: 12/07 0701 - 12/08 0700 In: 1704.2 [P.O.:1200; I.V.:504.2] Out: 1475 [Urine:1475]  Intake/Output this shift: No intake/output data recorded.  Lab Results: Recent Labs    10/06/19 0235  10/06/19 1852 10/07/19 0500 10/08/19 0500  WBC 9.7  --   --  10.1 11.5*  HGB 7.9*   < > 8.3* 8.3* 8.9*  HCT 24.0*   < > 25.4* 25.8* 28.6*  PLT 338  --   --  350 378   < > = values in this interval not displayed.   BMET Recent Labs    10/06/19 0235 10/07/19 0500 10/08/19 0500  NA 132* 134* 134*  K 3.3* 4.3 4.2  CL 100 103 102  CO2 23 23 21*  GLUCOSE 107* 117* 101*  BUN <5* <5* <5*  CREATININE 1.00 0.85 0.84  CALCIUM 8.4* 8.5* 8.7*   LFT Recent Labs    10/06/19 0235 10/07/19 0500 10/08/19 0500  PROT 6.5 6.7 6.9  ALBUMIN 2.3* 2.3* 2.5*  AST 269* 130* 90*  ALT 683* 467* 363*  ALKPHOS 177* 172* 163*  BILITOT 2.0* 1.7* 1.5*   PT/INR Recent Labs    10/06/19 1032  LABPROT 16.8*  INR 1.4*   Hepatitis Panel No results for input(s): HEPBSAG, HCVAB, HEPAIGM, HEPBIGM in the last 72 hours.  Studies/Results: No results found.   Scheduled Meds: . amLODipine  10 mg Oral q morning - 10a  . ciprofloxacin  500 mg Oral BID  . folic acid  1 mg Oral Daily  . Ipratropium-Albuterol   1 puff Inhalation Q6H  . multivitamin with minerals  1 tablet Oral Daily  . pantoprazole (PROTONIX) IV  40 mg Intravenous Q12H  . vitamin C  500 mg Oral Daily   Continuous Infusions: . octreotide  (SANDOSTATIN)    IV infusion 50 mcg/hr (10/08/19 0153)   PRN Meds:.guaiFENesin-dextromethorphan, ondansetron **OR** ondansetron (ZOFRAN) IV   ASSESMENT:   *   Acute liver injury.  Suspect ETOH abuse and APAP.  Cirrhosis.  Dose of  Mucomyst 12/4.  LFTs steadily improving  *   Covid 19 positive.  Has not required Remdesavir or steroids.    *   Dark stool ("suspected melena") 12/6. FOBT + on DOA 12/3.   Day 3 IV bid PPI.  Day 3 Octreotide.  HD stable.    Colonoscopy, Dr Benson [place], 12/2018.   Day 2/5 Cipro for SBP prophy in case this is bleeding related to portal htn.     *    [place] anemia.  Hgb stable.  Low iron and sats, ferritin 100 >> 605.  Marland Kitchen    Feraheme 12/4.  No PRBCs to date.    *  Hyponatremia, much improved from 117 >> 134.     *   Coagulopathy.  Improved.  Received Vit K 12/4, 12/6, 12/7.     *    Alcoholism, heavy consumption beer 12 to 24 cans/day PTA.     PLAN   *   Octreotide stops 9 AM today.  No plans for EGD at present.  Plan this for outpt unless pt developes signs acute GI hemorrhage.  Needs variceal screening and eval of paraesophageal adipose stranding observed on CT chest, no portal hypertension changes evident on LIver US doppler.      Chad Turner  10/08/2019, 8:29 AM Phone [phone]

## 2019-10-08 NOTE — Evaluation (Signed)
Physical Therapy Evaluation Patient Details Name: Chad Turner MRN: [id number] DOB: January 07, 1961 Today's Date: 10/08/2019   History of Present Illness  58 y.o. male with medical history significant of chronic alcoholism with ongoing alcohol abuse, depression, hypertension, chronic pain syndrome, prostate cancer and alcoholic cirrhosis who presented to the ER with 1 day nausea vomiting after drinking 12 beers. Fecal blood test positive, COVID + Admitted [id number] for alcoholic hepatitis, hyponatremia and AKI  Clinical Impression  PTA pt living with fiance in level entry apartment, completely independent in community ambulation without AD. Pt limited in safe mobility today by generalized weakness and associated mild instability. Pt is mod I for bed mobility, supervision for transfers and min guard progressing to supervision for ambulation of 300 feet without AD. Pt will not need skilled therapy or equipment at d/c. PT will continue to follow acutely for improving strength and balance.    Follow Up Recommendations No PT follow up    Equipment Recommendations  None recommended by PT       Precautions / Restrictions Precautions Precautions: None Restrictions Weight Bearing Restrictions: No      Mobility  Bed Mobility Overal bed mobility: Modified Independent             General bed mobility comments: HoB elevated and use of bed rail to pull to EoB  Transfers Overall transfer level: Needs assistance   Transfers: Sit to/from Stand Sit to Stand: Supervision         General transfer comment: supervision for safety  Ambulation/Gait Ambulation/Gait assistance: Supervision;Min guard Gait Distance (Feet): 300 Feet Assistive device: None Gait Pattern/deviations: Step-through pattern;Decreased step length - right;Decreased step length - left Gait velocity: min guard progressing to supervision for safety, with mildly unsteady gait which improved with distance, no overt LoB              Balance Overall balance assessment: Mild deficits observed, not formally tested                                           Pertinent Vitals/Pain Pain Assessment: No/denies pain    Home Living Family/patient expects to be discharged to:: Private residence Living Arrangements: Spouse/significant other Available Help at Discharge: Friend(s);Available 24 hours/day Type of Home: Apartment Home Access: Level entry     Home Layout: One level Home Equipment: Grab bars - tub/shower;Hand held shower head      Prior Function Level of Independence: Independent                  Extremity/Trunk Assessment   Upper Extremity Assessment Upper Extremity Assessment: Overall WFL for tasks assessed    Lower Extremity Assessment Lower Extremity Assessment: Overall WFL for tasks assessed       Communication   Communication: No difficulties  Cognition Arousal/Alertness: Awake/alert Behavior During Therapy: WFL for tasks assessed/performed Overall Cognitive Status: Within Functional Limits for tasks assessed                                        General Comments General comments (skin integrity, edema, etc.): VSS, SaO2 90-94%O2 during ambulation         Assessment/Plan    PT Assessment Patient needs continued PT services  PT Problem List Decreased strength;Decreased balance  PT Treatment Interventions DME instruction;Gait training;Functional mobility training;Therapeutic activities;Therapeutic exercise;Balance training;Cognitive remediation;Patient/family education    PT Goals (Current goals can be found in the Care Plan section)  Acute Rehab PT Goals Patient Stated Goal: go home PT Goal Formulation: With patient Time For Goal Achievement: 10/22/19 Potential to Achieve Goals: Good    Frequency Min 3X/week    AM-PAC PT "6 Clicks" Mobility  Outcome Measure Help needed turning from your back to your side while in a  flat bed without using bedrails?: None Help needed moving from lying on your back to sitting on the side of a flat bed without using bedrails?: None Help needed moving to and from a bed to a chair (including a wheelchair)?: None Help needed standing up from a chair using your arms (e.g., wheelchair or bedside chair)?: None Help needed to walk in hospital room?: None Help needed climbing 3-5 steps with a railing? : A Little 6 Click Score: 23    End of Session Equipment Utilized During Treatment: Gait belt Activity Tolerance: Patient tolerated treatment well Patient left: in chair;with call bell/phone within reach Nurse Communication: Mobility status PT Visit Diagnosis: Unsteadiness on feet (R26.81);Muscle weakness (generalized) (M62.81);Difficulty in walking, not elsewhere classified (R26.2)    Time: [id number] PT Time Calculation (min) (ACUTE ONLY): 22 min   Charges:   PT Evaluation $PT Eval Moderate Complexity: 1 Mod          Megon Kalina B. Migdalia Dk PT, DPT Acute Rehabilitation Services Pager [phone] Office [phone]   [place] 10/08/2019, 1:27 PM

## 2019-10-08 NOTE — Progress Notes (Signed)
Chad Turner to be D/C'd home per MD order. Discussed with the patient and all questions fully answered. VVS, Skin clean, dry and intact without evidence of skin break down, no evidence of skin tears noted.  IV catheters discontinued intact. Site without signs and symptoms of complications. Dressing and pressure applied.  An After Visit Summary was printed and given to the patient. Belongings from ED returned to patient via charge RN. Patient escorted via [place], and D/C home via private auto.  [name]  10/08/2019 1:17 PM

## 2019-10-08 NOTE — Discharge Instructions (Signed)
Liver Failure  The liver is a large organ in the upper right-hand side of the abdomen. It is involved in many important functions, including storing energy, producing fluids that the body needs, and removing harmful substances from the bloodstream. Liver failure is a condition in which the liver loses its ability to function due to injury or disease. Liver failure can develop quickly, over days or weeks (acute liver failure). It can also develop gradually, over months or years (chronic liver failure). What are the causes? Common causes of acute liver failure include:  Acetaminophen overdose.  Reaction to certain medicines.  Liver infection (viral hepatitis). Common causes of chronic liver failure include:  Viral hepatitis.  Liver disease.  Alcohol abuse. What are the signs or symptoms? Most people do not have symptoms in the early stages of liver failure. If early symptoms do develop, they may include:  Loss of appetite.  Nausea and vomiting.  Weakness and tiredness (fatigue).  Weight loss without trying.  Diarrhea. As the condition worsens, symptoms of this condition may include:  Yellowing of the skin and the whites of the eyes (jaundice).  Bruising and bleeding easily.  Itchy skin (pruritus).  Fluid buildup in the abdomen (ascites).  Personality and mood changes.  Confusion and sleepiness. How is this diagnosed? This condition is diagnosed based on your symptoms, your medical history, and a physical exam. You may have tests, including:  Blood tests.  CT scan.  MRI.  Ultrasound.  Removal of a small amount of liver tissue to be examined under a microscope (biopsy). How is this treated? Treatment for this condition depends on the cause and severity of symptoms. Treatment for chronic liver failure may include:  Medicines to help reduce symptoms.  Lifestyle changes, such as limiting salt and animal proteins in your diet. Foods that contain animal  proteins include red meat, fish, and dairy products. Acute or advanced (end stage) liver failure may require hospitalization. Treatment may include:  Giving antibiotic medicine.  Giving IV fluids that contain sugar (glucose) and minerals (electrolytes).  Flushing out toxic substances from the body using medicine (lactulose) or a cleansing procedure (enema).  Adding certain amounts of the liquid part of blood (plasma) to your bloodstream (receiving a transfusion).  Using an artificial kidney to filter your blood (hemodialysis) if you have renal failure.  Using breathing support and a breathing tube (respirator).  Having a liver transplant. This is a surgery to replace your liver with another person's liver (donor liver). This may be the best option if your liver has completely stopped functioning. Follow these instructions at home: Eating and drinking  Follow instructions from your health care provider about eating and drinking restrictions. This may include: ? Limiting the amount of animal protein that you eat. ? Increasing the amount of plant-based protein that you eat. Foods that contain plant-based proteins include whole grains, nuts, and vegetables. ? Taking vitamin supplements. ? Limiting the amount of salt that you eat. Lifestyle   Do not drink alcohol.  Do not use any products that contain nicotine or tobacco, such as cigarettes, e-cigarettes, and chewing tobacco. If you need help quitting, ask your health care provider.  Exercise regularly, as told by your health care provider. General instructions  Take over-the-counter and prescription medicines only as told by your health care provider.  Follow instructions from your health care provider about maintaining your vaccinations, especially vaccinations against hepatitis A and B.  Keep all follow-up visits as told by your health  care provider. This is important. Contact a health care provider if:  You have symptoms that  get worse.  You lose a lot of weight without trying.  You have a fever or chills. Get help right away if:  You become confused or very sleepy.  You cannot take care of yourself or be taken care of at home.  You are not urinating.  You have difficulty breathing.  You vomit blood. Summary  Liver failure is a condition in which the liver loses its ability to function due to injury or disease.  Treatment for this condition depends on the cause and severity of symptoms.  Take over-the-counter and prescription medicines only as told by your health care provider.  Contact a health care provider if you have symptoms that get worse.  Keep all follow-up visits as told by your health care provider. This is important. This information is not intended to replace advice given to you by your health care provider. Make sure you discuss any questions you have with your health care provider. Document Released: 07/08/2015 Document Revised: 03/28/2018 Document Reviewed: 03/28/2018 Elsevier Patient Education  2020 [employer].

## 2019-10-08 NOTE — Discharge Summary (Addendum)
Physician Discharge Summary  Chad Turner SWN:[id number] DOB: 12/14/1960 DOA: 10/03/2019  PCP: [facility]  Admit date: 10/03/2019 Discharge date: 10/08/2019  Admitted From: Home  Disposition:  Home   Recommendations for Outpatient Follow-up:  1. Follow up with PCP in 1-2 weeks 2. Please obtain BMP/CBC in one week 3. Follow up with GI for endoscopy and further evaluation of LFT 4. Needs follow up with GI for pancreatic Mass  Home Health: none  Discharge Condition: Stable.  CODE STATUS: Full code Diet recommendation: Heart Healthy   Brief/Interim Summary: 58 year-old with past medical history significant for alcohol abuse, alcoholic cirrhosis?,  Depression, chronic pain syndrome, prostate cancer status post radiotherapy, hypertension who presents on 10/03/2019 with nausea and vomiting for 1 day duration.  Patient reporter 12 beers a day of admission. He was admitted with alcoholic hepatitis, hyponatremia and AKI.  GI was consulted for alcoholic hepatitis.   1-Acute  liver injury  related to Tylenol use  and alcohol intake Patient with Cirrhosis.  -Patient received 2 days of acetylcysteine, due to concern of Tylenol overdose. -Liver function tests trending down. -Might have component of COVID-19 affecting liver function -Acute hepatitis panel negative -GI started ciprofloxacin, I will provide prescription at discharge/  -Discussed with Dr.  Rush Landmark recommend to  start nadolol 10 mg daily. Follow up out patient for endoscopy. I called patient and informed him of new prescriptions at [facility].  -LFT trending down  -Counseling provided to patient , recommend to avoid alcohol, percocet, tylenol.   2-Melena, acute blood loss anemia: Patient report one episode of black bowel movement. Hemoglobin dropped from 9----7.9---8.0 Treated with  IV Protonix twice daily, IV octreotide while inpatient.  INR 1.4.  Received vitamin K.  Hb remain stable. No further  melena.  Plan to resume diet and monitor hb in 24 hours.  Hb remain stable. No further melena.  Discharge on oral PPI  AKI; Creatinine baseline 0.6. -Creatinine on admission 1.5. Continue to trend down. -Agree with holding hydrochlorothiazide.  3-Alcohol abuse: No evidence of alcohol withdrawal.  Continue with CIWA protocol.  COVID-19 infection: Thankfully patient without respiratory symptoms. GI symptoms improved. Will avoid Remdesivir  in the setting of transaminases. CRP did not significantly increase. D-dimer significantly elevated but will avoid full dose anticoagulation in the setting of liver failure and prior history of GI bleed.  D-dimer trending down. Sat above 95 %. Wont start dexamethasone at this time.  We will avoid IV steroids in the setting of GI bleed. Patient was advised to Quarantine until 10-18-2019. Advised to return to hospital if he develops Dyspnea or worsening cough COVID-19 Labs  Recent Labs (last 2 labs)         Recent Labs    10/04/19 2007 10/05/19 0500 10/06/19 0235 10/07/19 0500  DDIMER 9.12* 7.36* 3.46*  --   FERRITIN 157 372* 578* 738*  LDH 228*  --   --   --   CRP 2.6* 2.7* 2.2*  --       Recent Labs       Lab Results  Component Value Date   SARSCOV2NAA POSITIVE (A) 10/04/2019   SARSCOV2NAA NOT DETECTED 06/17/2019     Coagulopathy: Likely related to decompensated liver failure. Received vitamin K.  Continue to monitor.  Iron deficiency anemia prior history of GI bleed and alcoholism: Received Fera-heme Hemoglobin stable  Hypertension: Normotensive hydrochlorothiazide stopped due to AKI.   Severe Hyponatremia; received IV fluids and fluids restriction . Sodium improved from  117 to 133.   History of pancreatic mass; per GI.  Needs follow up.   Acute metabolic encephalopathy; related to alcohol intake, hyponatremia.  Improved. He is alert and oriented times 3.   Hypokalemia: Resolved.   Hypophosphatemia:  Resolved Estimated body mass index is 29.83 kg/m as calculated from the following:   Height as of this encounter: 5' 10"  (1.778 m).   Weight as of this encounter: 94.3 kg.     Discharge Diagnoses:  Principal Problem:   COVID-19 virus infection Active Problems:   Alcohol dependence ([place])   Cocaine abuse ([place])   Pancreatic mass   Hypertension   Hypokalemia   Malignant neoplasm of prostate ([place])   AKI (acute kidney injury) ([place])   Alcoholic hepatitis   Acute hepatitis    Discharge Instructions  Discharge Instructions    Diet - low sodium heart healthy   Complete by: As directed    Increase activity slowly   Complete by: As directed    MyChart COVID-19 home monitoring program   Complete by: Oct 08, 2019    Is the patient willing to use the [place] for home monitoring?: No     Allergies as of 10/08/2019      Reactions   Penicillins Swelling   Facial swelling Has patient had a PCN reaction causing immediate rash, facial/tongue/throat swelling, SOB or lightheadedness with hypotension: Yes Has patient had a PCN reaction causing severe rash involving mucus membranes or skin necrosis: No Has patient had a PCN reaction that required hospitalization: Already there Has patient had a PCN reaction occurring within the last 10 years: No If all of the above answers are "NO", then may proceed with Cephalosporin use.      Medication List    STOP taking these medications   hydrochlorothiazide 12.5 MG capsule Commonly known as: MICROZIDE   HYDROcodone-acetaminophen 7.5-325 MG tablet Commonly known as: Norco   ibuprofen 800 MG tablet Commonly known as: ADVIL   ondansetron 4 MG disintegrating tablet Commonly known as: ZOFRAN-ODT   pravastatin 20 MG tablet Commonly known as: PRAVACHOL   tiZANidine 4 MG tablet Commonly known as: Zanaflex     TAKE these medications   amLODipine 10 MG tablet Commonly known as: NORVASC Take 10 mg by mouth every morning.    ciprofloxacin 500 MG tablet Commonly known as: CIPRO Take 1 tablet (500 mg total) by mouth 2 (two) times daily for 3 days.   folic acid 1 MG tablet Commonly known as: FOLVITE Take 1 tablet (1 mg total) by mouth daily. Start taking on: October 09, 2019   guaiFENesin-dextromethorphan 100-10 MG/5ML syrup Commonly known as: ROBITUSSIN DM Take 10 mLs by mouth every 4 (four) hours as needed for cough.   Ipratropium-Albuterol 20-100 MCG/ACT Aers respimat Commonly known as: COMBIVENT Inhale 1 puff into the lungs every 6 (six) hours.   mupirocin ointment 2 % Commonly known as: BACTROBAN Apply 1 application topically 2 (two) times daily.   nadolol 20 MG tablet Commonly known as: CORGARD Take 0.5 tablets (10 mg total) by mouth daily.   pantoprazole 40 MG tablet Commonly known as: Protonix Take 1 tablet (40 mg total) by mouth 2 (two) times daily.   traMADol 50 MG tablet Commonly known as: ULTRAM Take 50 mg by mouth every 6 (six) hours as needed for moderate pain.   VITAMIN C PO Take 1 tablet by mouth daily.      Follow-up Information    [facility] Follow up  in 2 week(s).   Specialty: Family Medicine Contact information: [address]  [place] [address] [phone]          Allergies  Allergen Reactions  . Penicillins Swelling    Facial swelling Has patient had a PCN reaction causing immediate rash, facial/tongue/throat swelling, SOB or lightheadedness with hypotension: Yes Has patient had a PCN reaction causing severe rash involving mucus membranes or skin necrosis: No Has patient had a PCN reaction that required hospitalization: Already there Has patient had a PCN reaction occurring within the last 10 years: No If all of the above answers are "NO", then may proceed with Cephalosporin use.    Consultations: GI  Procedures/Studies: Dg Chest 2 View  Result Date: 09/30/2019 CLINICAL DATA:  Chest pain and left arm numbness for 3  days. EXAM: CHEST - 2 VIEW COMPARISON:  10/05/2018. FINDINGS: Trachea is midline. Heart size normal. Lungs are clear. No pleural fluid. IMPRESSION: No acute findings. Electronically Signed   By: Lorin Picket M.D.   On: 09/30/2019 14:25   Ct Angio Chest Pe W And/or Wo Contrast  Result Date: 09/30/2019 CLINICAL DATA:  Left-sided chest pain, elevated D-dimer level. Vomiting and weakness. Left arm pain. EXAM: CT ANGIOGRAPHY CHEST WITH CONTRAST TECHNIQUE: Multidetector CT imaging of the chest was performed using the standard protocol during bolus administration of intravenous contrast. Multiplanar CT image reconstructions and MIPs were obtained to evaluate the vascular anatomy. CONTRAST:  82m OMNIPAQUE IOHEXOL 350 MG/ML SOLN COMPARISON:  Chest radiograph 09/30/2019 FINDINGS: Cardiovascular: No filling defect is identified in the pulmonary arterial tree to suggest pulmonary embolus. Atherosclerotic calcification of the thoracic aorta, left main, left anterior descending, and right coronary artery. Mild cardiomegaly. Mediastinum/Nodes: Small right paratracheal lymph nodes are not pathologically enlarged by size criteria. Stranding/edema in the paraesophageal adipose tissue of the lower thorax as on images 69 through 79 of series 5, significance uncertain. Lungs/Pleura: Hazy dependent opacities both lower lobes favoring subsegmental atelectasis, potentially with a component of air trapping. There is mild airway thickening more notable in the right lower lobe. Upper Abdomen: Small indistinctly marginated right gastric lymph nodes with faint surrounding stranding. Musculoskeletal: Thoracic spondylosis. Review of the MIP images confirms the above findings. IMPRESSION: 1. No filling defect is identified in the pulmonary arterial tree to suggest pulmonary embolus. 2. Stranding/edema in the paraesophageal adipose tissue of the lower thorax,, and to a lesser degree along small right gastric lymph nodes just below the  hiatus. Possibilities may include local gastroesophageal inflammation, or possibly irritation from a sliding hiatal hernia which is currently reduced. 3. Mild airway thickening is present, suggesting bronchitis or reactive airways disease. 4. Aortic atherosclerosis. Coronary atherosclerosis. Mild cardiomegaly. 5. Hazy dependent opacities both lower lobes favoring subsegmental atelectasis, potentially with a component of air trapping. Aortic Atherosclerosis (ICD10-I70.0). Electronically Signed   By: WVan ClinesM.D.   On: 09/30/2019 19:33   [place]iver Doppler  Result Date: 10/04/2019 CLINICAL DATA:  58year old male with a history of alcohol abuse, hepatomegaly, steatosis and elevated LFTs. EXAM: DUPLEX ULTRASOUND OF LIVER TECHNIQUE: Color and duplex Doppler ultrasound was performed to evaluate the hepatic in-flow and out-flow vessels. COMPARISON:  MR abdomen 12/13/2017 FINDINGS: Liver: The hepatic parenchyma is heterogeneous, echogenic and coarsened. The adjacent renal parenchyma appears hypoechoic in comparison. The hepatic contour is not well seen. No focal lesion, mass or intrahepatic biliary ductal dilatation. Main Portal Vein size: 1.1 cm cm Portal Vein Velocities Main Prox:  46 cm/sec with normal hepatopetal flow. Main Mid: 426  cm/sec  with normal hepatopetal flow. Main Dist:  42 cm/sec  with normal hepatopetal flow. Right: 32 cm/sec Left: 34 cm/sec Hepatic Vein Velocities Right:  42 cm/sec Middle:  28 cm/sec Left:  25 cm/sec IVC: Present and patent with normal respiratory phasicity. Hepatic Artery Velocity:  122 cm/sec Splenic Vein Velocity:  18 cm/sec Spleen: 7.6 cm x 10.4 cm x 4.0 cm with a total volume of 164 cm^3 (411 cm^3 is upper limit normal) Portal Vein Occlusion/Thrombus: No Splenic Vein Occlusion/Thrombus: No Ascites: None Varices: None IMPRESSION: 1. Patent and unremarkable portal and hepatic veins. No evidence of portal hypertension or venous thrombosis. 2. Diffusely heterogeneous and  echogenic liver parenchyma. The sonographic appearance is most consistent with hepatic steatosis. Cirrhosis from non steatotic causes can have a similar appearance but is usually somewhat less echogenic. 3. No evidence of ascites. Electronically Signed   By: Jacqulynn Cadet M.D.   On: 10/04/2019 08:39     Subjective: Alert and oriented times 3.  No melena  Discharge Exam: Vitals:   10/08/19 0622 10/08/19 0830  BP: (!) 106/53 (!) 141/55  Pulse: 85 99  Resp:  16  Temp: 98.9 F (37.2 C)   SpO2:  97%     General: Pt is alert, awake, not in acute distress Cardiovascular: RRR, S1/S2 +, no rubs, no gallops Respiratory: CTA bilaterally, no wheezing, no rhonchi Abdominal: Soft, NT, ND, bowel sounds + Extremities: no edema, no cyanosis    The results of significant diagnostics from this hospitalization (including imaging, microbiology, ancillary and laboratory) are listed below for reference.     Microbiology: Recent Results (from the past 240 hour(s))  SARS CORONAVIRUS 2 (TAT 6-24 HRS) Nasopharyngeal Nasopharyngeal Swab     Status: Abnormal   Collection Time: 10/04/19  2:28 AM   Specimen: Nasopharyngeal Swab  Result Value Ref Range Status   SARS Coronavirus 2 POSITIVE (A) NEGATIVE Final    Comment: RESULT CALLED TO, READ BACK BY AND VERIFIED WITH: K. Gower, [facility] ED RN AT [phone] ON 10/04/19 BY C. JESSUP, MT. (NOTE) SARS-CoV-2 target nucleic acids are DETECTED. The SARS-CoV-2 RNA is generally detectable in upper and lower respiratory specimens during the acute phase of infection. Positive results are indicative of the presence of SARS-CoV-2 RNA. Clinical correlation with patient history and other diagnostic information is  necessary to determine patient infection status. Positive results do not rule out bacterial infection or co-infection with other viruses.  The expected result is Negative. Fact Sheet for Patients: SugarRoll.be Fact Sheet for  Healthcare Providers: https://www.woods-mathews.com/ This test is not yet approved or cleared by the [place] FDA and  has been authorized for detection and/or diagnosis of SARS-CoV-2 by FDA under an Emergency Use Authorization (EUA). This EUA will remain  in effect (meaning this test c an be used) for the duration of the COVID-19 declaration under Section 564(b)(1) of the Act, 21 U.S.C. section 360bbb-3(b)(1), unless the authorization is terminated or revoked sooner. Performed at [facility], [place] [address], [place], [place] [address]   MRSA PCR Screening     Status: None   Collection Time: 10/04/19 10:53 PM   Specimen: Nasopharyngeal  Result Value Ref Range Status   MRSA by PCR NEGATIVE NEGATIVE Final    Comment:        The GeneXpert MRSA Assay (FDA approved for NASAL specimens only), is one component of a comprehensive MRSA colonization surveillance program. It is not intended to diagnose MRSA infection nor to guide or monitor treatment  for MRSA infections. Performed at [facility], [place] [address] [place], [place] [address]      Labs: BNP (last 3 results) Recent Labs    10/04/19 2007  BNP 333.5*   Basic Metabolic Panel: Recent Labs  Lab 10/04/19 0534 10/04/19 0600 10/05/19 0500 10/06/19 0235 10/07/19 0500 10/08/19 0500  NA 122*  --  132* 132* 134* 134*  K 3.3*  --  3.9 3.3* 4.3 4.2  CL 91*  --  98 100 103 102  CO2 22  --  22 23 23  21*  GLUCOSE 84  --  76 107* 117* 101*  BUN <5*  --  <5* <5* <5* <5*  CREATININE 1.27*  --  1.03 1.00 0.85 0.84  CALCIUM 8.1*  --  8.9 8.4* 8.5* 8.7*  MG  --  2.2 2.2 2.0 1.9 1.9  PHOS  --  1.8* 1.8* 3.9 3.6 3.8   Liver Function Tests: Recent Labs  Lab 10/04/19 0534 10/05/19 0500 10/06/19 0235 10/07/19 0500 10/08/19 0500  AST 1,238* 622* 269* 130* 90*  ALT 1,210* 1,010* 683* 467* 363*  ALKPHOS 225* 207* 177* 172* 163*  BILITOT 3.5* 3.4* 2.0* 1.7* 1.5*  PROT 6.4* 7.5 6.5 6.7 6.9   ALBUMIN 2.6* 2.8* 2.3* 2.3* 2.5*   Recent Labs  Lab 10/03/19 1731  LIPASE 46   No results for input(s): AMMONIA in the last 168 hours. CBC: Recent Labs  Lab 10/04/19 2007 10/05/19 0500 10/06/19 0235 10/06/19 1032 10/06/19 1852 10/07/19 0500 10/08/19 0500  WBC 8.9 8.5 9.7  --   --  10.1 11.5*  NEUTROABS 7.2 5.4 5.4  --   --  5.8 6.4  HGB 9.3* 9.5* 7.9* 8.2* 8.3* 8.3* 8.9*  HCT 28.3* 28.1* 24.0* 25.1* 25.4* 25.8* 28.6*  MCV 76.7* 76.6* 76.2*  --   --  79.1* 82.4  PLT 386 395 338  --   --  350 378   Cardiac Enzymes: Recent Labs  Lab 10/04/19 0007  CKTOTAL 100   BNP: Invalid input(s): POCBNP CBG: No results for input(s): GLUCAP in the last 168 hours. D-Dimer Recent Labs    10/06/19 0235  DDIMER 3.46*   Hgb A1c No results for input(s): HGBA1C in the last 72 hours. Lipid Profile No results for input(s): CHOL, HDL, LDLCALC, TRIG, CHOLHDL, LDLDIRECT in the last 72 hours. Thyroid function studies No results for input(s): TSH, T4TOTAL, T3FREE, THYROIDAB in the last 72 hours.  Invalid input(s): FREET3 Anemia work up Recent Labs    10/07/19 0500 10/08/19 0500  FERRITIN 738* 605*   Urinalysis    Component Value Date/Time   COLORURINE YELLOW 10/03/2019 2045   APPEARANCEUR CLEAR 10/03/2019 2045   LABSPEC 1.005 10/03/2019 2045   PHURINE 6.0 10/03/2019 2045   GLUCOSEU NEGATIVE 10/03/2019 2045   HGBUR NEGATIVE 10/03/2019 2045   BILIRUBINUR NEGATIVE 10/03/2019 2045   KETONESUR NEGATIVE 10/03/2019 2045   PROTEINUR NEGATIVE 10/03/2019 2045   UROBILINOGEN 1.0 09/18/2015 1459   NITRITE NEGATIVE 10/03/2019 2045   LEUKOCYTESUR NEGATIVE 10/03/2019 2045   Sepsis Labs Invalid input(s): PROCALCITONIN,  WBC,  LACTICIDVEN Microbiology Recent Results (from the past 240 hour(s))  SARS CORONAVIRUS 2 (TAT 6-24 HRS) Nasopharyngeal Nasopharyngeal Swab     Status: Abnormal   Collection Time: 10/04/19  2:28 AM   Specimen: Nasopharyngeal Swab  Result Value Ref Range Status    SARS Coronavirus 2 POSITIVE (A) NEGATIVE Final    Comment: RESULT CALLED TO, READ BACK BY AND VERIFIED WITH: K. Owosso, [facility]  ED RN AT [phone] ON 10/04/19 BY C. JESSUP, MT. (NOTE) SARS-CoV-2 target nucleic acids are DETECTED. The SARS-CoV-2 RNA is generally detectable in upper and lower respiratory specimens during the acute phase of infection. Positive results are indicative of the presence of SARS-CoV-2 RNA. Clinical correlation with patient history and other diagnostic information is  necessary to determine patient infection status. Positive results do not rule out bacterial infection or co-infection with other viruses.  The expected result is Negative. Fact Sheet for Patients: SugarRoll.be Fact Sheet for Healthcare Providers: https://www.woods-mathews.com/ This test is not yet approved or cleared by the [place] FDA and  has been authorized for detection and/or diagnosis of SARS-CoV-2 by FDA under an Emergency Use Authorization (EUA). This EUA will remain  in effect (meaning this test c an be used) for the duration of the COVID-19 declaration under Section 564(b)(1) of the Act, 21 U.S.C. section 360bbb-3(b)(1), unless the authorization is terminated or revoked sooner. Performed at [facility], [place] [address], [place], [place] [address]   MRSA PCR Screening     Status: None   Collection Time: 10/04/19 10:53 PM   Specimen: Nasopharyngeal  Result Value Ref Range Status   MRSA by PCR NEGATIVE NEGATIVE Final    Comment:        The GeneXpert MRSA Assay (FDA approved for NASAL specimens only), is one component of a comprehensive MRSA colonization surveillance program. It is not intended to diagnose MRSA infection nor to guide or monitor treatment for MRSA infections. Performed at [facility], [place] [address] [place],  [address]      Time coordinating discharge: 40 minutes  SIGNED:   Elmarie Shiley, MD  [facility]

## 2019-10-08 NOTE — Care Management (Signed)
Pt discharged prior to assessment from CM.  CM spoke with pt via phone.  Pt denied all NC360 parameters.  Pt confirms he has PCP and pt agreed to contact his PCP office and request a post hospital discharge follow up appt - pt will need to inform office that his is covid positive.  Pt denied barriers with paying for discharge medications.   No needs nor concerns communicated.   NO CM needs identified - CM signing off

## 2019-10-11 ENCOUNTER — Telehealth: Payer: Self-pay | Admitting: *Deleted

## 2019-10-11 NOTE — Telephone Encounter (Signed)
-----   Message from Jerene Bears, MD sent at 10/10/2019  4:08 PM EST ----- Regarding: FW: Follow-up endoscopy See below See if pt could come for EGD in [place] on 10/22/2019 for variceal screening Thanks JMP ----- Message ----- From: Irving Copas., MD Sent: 10/09/2019  10:14 AM EST To: Timothy Lasso, RN, Jerene Bears, MD, # Subject: Follow-up endoscopy                            Jay,This is the patient that we discussed.He came in and was found to have hyponatremia and significant liver insufficiency.He was tested and found to be positive for Covid.Most likely a result of underlying Tylenol use and alcohol consumption.Liver Doppler ultrasound suggests possibility of underlying cirrhosis but no varices noted.Had reported dark stools but never really witnessed to have melena and only occurred on one occasion he is resumption of brown stools thereafter.He was sent home with beta-blockade. Needs upper endoscopy for risk stratification of whether he has varices or not.If no varices then he can have his beta-blocker stopped.There may be some availability on my list before the end of the year, let me know and we can work on what we can find.He has positive Covid test on 12/5. Thanks. GM

## 2019-10-11 NOTE — Telephone Encounter (Signed)
I have spoken to patient. He has scheduled endoscopy on 10/22/19 and previsit on 10/17/19 at 4 pm.

## 2019-10-14 ENCOUNTER — Telehealth: Payer: Self-pay | Admitting: *Deleted

## 2019-10-14 DIAGNOSIS — R197 Diarrhea, unspecified: Secondary | ICD-10-CM

## 2019-10-14 DIAGNOSIS — U071 COVID-19: Secondary | ICD-10-CM

## 2019-10-14 NOTE — Telephone Encounter (Signed)
Pt scheduled for 12-21 at 130 pm  EGD 1-5 Tuesday 130 pm  Pt aware Dr Vena Rua CMA will call with Lab scheduling

## 2019-10-14 NOTE — Telephone Encounter (Signed)
Would delay EGD another 2 weeks given symptoms and recent COVID19 infection Would be appropriate to also delay the previsit to allow more time for recovery I do recommend he come (if afebrile) for CBC, CMP and INR on Monday or Tues (Dec 21/22)

## 2019-10-14 NOTE — Telephone Encounter (Signed)
Dr Hilarie Fredrickson,  I spoke with this pt on the phone today about his PV on Thursday 12-17 and his EGD 12-22 Tuesday for cirrhosis, variceal screening-  Pt tested Positive for COVID 12-4- he explained to me today he is coughing a lot still-  ( ED note stated no respiratory s/s, but pt developed)- he states he is still weak, but weakness improving. Pt denies fever at this time    Should he be ok to proceed with  An in person PV Thursday since he has symptoms and also his EGD Tuesday 12-22?

## 2019-10-14 NOTE — Telephone Encounter (Signed)
Labs have been placed in Epic and patient is advised to have these drawn on the day of his previsit pending he is feeling better, is not afebrile/symptomatic. Patient verbalizes understanding of this.

## 2019-10-21 ENCOUNTER — Encounter (HOSPITAL_COMMUNITY): Payer: Self-pay

## 2019-10-21 ENCOUNTER — Ambulatory Visit (HOSPITAL_COMMUNITY)
Admission: EM | Admit: 2019-10-21 | Discharge: 2019-10-21 | Disposition: A | Discharge status: Home / Self Care | Payer: Medicare Other | Attending: Family Medicine | Admitting: Family Medicine

## 2019-10-21 ENCOUNTER — Other Ambulatory Visit: Payer: Self-pay

## 2019-10-21 ENCOUNTER — Ambulatory Visit: Payer: Medicare Other | Admitting: *Deleted

## 2019-10-21 VITALS — Ht 70.0 in | Wt 210.0 lb

## 2019-10-21 DIAGNOSIS — U071 COVID-19: Secondary | ICD-10-CM | POA: Diagnosis not present

## 2019-10-21 DIAGNOSIS — K7460 Unspecified cirrhosis of liver: Secondary | ICD-10-CM

## 2019-10-21 NOTE — ED Triage Notes (Addendum)
Pt tested positive on 12/3 for covid 19. Pt is needs a note so his maintenance people will come and fix his hot water heater.  Pt denies any symptom to covid 19 at this time

## 2019-10-21 NOTE — Progress Notes (Signed)
Pt is aware that care partner will wait in the car during procedure; if they feel like they will be too hot or cold to wait in the car; they may wait in the 4 th floor lobby. Patient is aware to bring only one care partner. We want them to wear a mask (we do not have any that we can provide them), practice social distancing, and we will check their temperatures when they get here.  I did remind the patient that their care partner needs to stay in the parking lot the entire time and have a cell phone available, we will call them when the pt is ready for discharge. Patient will wear mask into building.  Pt's previsit is done over the phone and all paperwork (prep instructions, blank consent form to just read over, pre-procedure acknowledgement form and stamped envelope) sent to patient  No egg or soy allergy  No home oxygen use or problems with anesthesia  No medications for weight loss taken  emmi information given  Pt had the covid on 10-04-19- he states he is asymptomatic now

## 2019-10-22 ENCOUNTER — Encounter: Payer: Medicare Other | Admitting: Internal Medicine
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
## 2019-11-05 ENCOUNTER — Other Ambulatory Visit: Payer: Self-pay

## 2019-11-05 ENCOUNTER — Ambulatory Visit (AMBULATORY_SURGERY_CENTER): Payer: Medicare Other | Admitting: Internal Medicine

## 2019-11-05 ENCOUNTER — Encounter: Payer: Self-pay | Admitting: Internal Medicine

## 2019-11-05 VITALS — BP 123/63 | HR 87 | Temp 98.7°F | Resp 20 | Ht 69.0 in | Wt 210.0 lb

## 2019-11-05 DIAGNOSIS — K7460 Unspecified cirrhosis of liver: Secondary | ICD-10-CM | POA: Diagnosis not present

## 2019-11-05 DIAGNOSIS — K449 Diaphragmatic hernia without obstruction or gangrene: Secondary | ICD-10-CM | POA: Diagnosis not present

## 2019-11-05 MED ORDER — SODIUM CHLORIDE 0.9 % IV SOLN: 500.0000 mL | Freq: Once | INTRAVENOUS | Status: DC

## 2019-11-05 NOTE — Progress Notes (Signed)
Report to PACU, RN, vss, BBS= Clear.  

## 2019-11-05 NOTE — Op Note (Signed)
[facility] Patient Name: Chad Turner Procedure Date: 11/05/2019 12:54 PM MRN: [id number] Endoscopist: Jerene Bears , MD Age: 59 Referring MD:  Date of Birth: 1961/07/27 Gender: Male Account #: [id number] Procedure:                Upper GI endoscopy Indications:              Cirrhosis rule out esophageal varices Medicines:                Monitored Anesthesia Care Procedure:                Pre-Anesthesia Assessment:                           - Prior to the procedure, a History and Physical                            was performed, and patient medications and                            allergies were reviewed. The patient's tolerance of                            previous anesthesia was also reviewed. The risks                            and benefits of the procedure and the sedation                            options and risks were discussed with the patient.                            All questions were answered, and informed consent                            was obtained. Prior Anticoagulants: The patient has                            taken no previous anticoagulant or antiplatelet                            agents. ASA Grade Assessment: III - A patient with                            severe systemic disease. After reviewing the risks                            and benefits, the patient was deemed in                            satisfactory condition to undergo the procedure.                           After obtaining informed consent, the endoscope was  passed under direct vision. Throughout the                            procedure, the patient's blood pressure, pulse, and                            oxygen saturations were monitored continuously. The                            Endoscope was introduced through the mouth, and                            advanced to the second part of duodenum. The upper                            GI endoscopy was  accomplished without difficulty.                            The patient tolerated the procedure well. Scope In: Scope Out: Findings:                 Normal mucosa was found in the entire esophagus.                           A 2 cm hiatal hernia was present.                           There is no endoscopic evidence of varices in the                            entire esophagus.                           The entire examined stomach was normal. No evidence                            of gastric varices.                           The examined duodenum was normal. Complications:            No immediate complications. Estimated Blood Loss:     Estimated blood loss: none. Impression:               - Normal mucosa was found in the entire esophagus.                           - 2 cm hiatal hernia.                           - Normal stomach.                           - Normal examined duodenum.                           - No evidence  of gastric or esophageal varices.                           - No specimens collected. Recommendation:           - Patient has a contact number available for                            emergencies. The signs and symptoms of potential                            delayed complications were discussed with the                            patient. Return to normal activities tomorrow.                            Written discharge instructions were provided to the                            patient.                           - Resume previous diet.                           - Continue present medications.                           - No alcohol.                           - Repeat upper endoscopy in 2 years for screening                            purposes.                           - Return to GI clinic in 3 months. Jerene Bears, MD 11/05/2019 2:02:23 PM This report has been signed electronically.

## 2019-11-05 NOTE — Patient Instructions (Signed)
Handout given on Hiatal Hernia. Follow up with Dr. Hilarie Fredrickson in his office in 3 months.  Avoid alcohol consumption.  YOU HAD AN ENDOSCOPIC PROCEDURE TODAY AT [facility]:   Refer to the procedure report that was given to you for any specific questions about what was found during the examination.  If the procedure report does not answer your questions, please call your gastroenterologist to clarify.  If you requested that your care partner not be given the details of your procedure findings, then the procedure report has been included in a sealed envelope for you to review at your convenience later.  YOU SHOULD EXPECT: Some feelings of bloating in the abdomen. Passage of more gas than usual.  Walking can help get rid of the air that was put into your GI tract during the procedure and reduce the bloating. If you had a lower endoscopy (such as a colonoscopy or flexible sigmoidoscopy) you may notice spotting of blood in your stool or on the toilet paper. If you underwent a bowel prep for your procedure, you may not have a normal bowel movement for a few days.  Please Note:  You might notice some irritation and congestion in your nose or some drainage.  This is from the oxygen used during your procedure.  There is no need for concern and it should clear up in a day or so.  SYMPTOMS TO REPORT IMMEDIATELY:    Following upper endoscopy (EGD)  Vomiting of blood or coffee ground material  New chest pain or pain under the shoulder blades  Painful or persistently difficult swallowing  New shortness of breath  Fever of 100F or higher  Black, tarry-looking stools  For urgent or emergent issues, a gastroenterologist can be reached at any hour by calling [phone].   DIET:  We do recommend a small meal at first, but then you may proceed to your regular diet.  Drink plenty of fluids but you should avoid alcoholic beverages for 24 hours.  ACTIVITY:  You should plan to take it easy for  the rest of today and you should NOT DRIVE or use heavy machinery until tomorrow (because of the sedation medicines used during the test).    FOLLOW UP: Our staff will call the number listed on your records 48-72 hours following your procedure to check on you and address any questions or concerns that you may have regarding the information given to you following your procedure. If we do not reach you, we will leave a message.  We will attempt to reach you two times.  During this call, we will ask if you have developed any symptoms of COVID 19. If you develop any symptoms (ie: fever, flu-like symptoms, shortness of breath, cough etc.) before then, please call [phone].  If you test positive for Covid 19 in the 2 weeks post procedure, please call and report this information to [place].    If any biopsies were taken you will be contacted by phone or by letter within the next 1-3 weeks.  Please call us at [phone] if you have not heard about the biopsies in 3 weeks.    SIGNATURES/CONFIDENTIALITY: You and/or your care partner have signed paperwork which will be entered into your electronic medical record.  These signatures attest to the fact that that the information above on your After Visit Summary has been reviewed and is understood.  Full responsibility of the confidentiality of this discharge information lies with you and/or your care-partner.

## 2019-11-05 NOTE — Progress Notes (Signed)
Temp by JR, Vitals by CW

## 2019-11-07 ENCOUNTER — Telehealth: Payer: Self-pay | Admitting: *Deleted

## 2019-11-07 NOTE — Telephone Encounter (Signed)
Mailbox full ,unable to leave message on f/u call

## 2019-11-07 NOTE — Telephone Encounter (Signed)
  Follow up Call-  Call back number 11/05/2019  Post procedure Call Back phone  # [phone]  Permission to leave phone message Yes  Some recent data might be hidden     Patient questions:  "Your call cannot be completed at this time."  Second call.

## 2019-11-13 ENCOUNTER — Telehealth: Payer: Self-pay | Admitting: *Deleted

## 2019-11-13 NOTE — Telephone Encounter (Signed)
Returned patient's phone call, unable to leave message, vm full, will call

## 2019-11-25 ENCOUNTER — Encounter: Payer: Self-pay | Admitting: *Deleted

## 2019-12-10 ENCOUNTER — Telehealth: Payer: Self-pay | Admitting: Internal Medicine

## 2019-12-10 NOTE — Telephone Encounter (Signed)
Pt had an EGD 11/05/19.  Pt stated that "his lower back is killing him" and mentioned something regarding his liver.  Pt was difficult to understand.  Please call him to discuss.

## 2019-12-10 NOTE — Telephone Encounter (Signed)
Discussed with pt that he should check with his PCP regarding his back pain. Pt verbalized understanding.

## 2019-12-25 ENCOUNTER — Other Ambulatory Visit: Payer: Self-pay

## 2019-12-25 ENCOUNTER — Other Ambulatory Visit: Payer: Self-pay | Admitting: Family

## 2019-12-25 ENCOUNTER — Ambulatory Visit
Admission: RE | Admit: 2019-12-25 | Discharge: 2019-12-25 | Disposition: A | Discharge status: Home / Self Care | Payer: Medicare Other | Source: Ambulatory Visit | Attending: Family | Admitting: Family

## 2019-12-25 DIAGNOSIS — M549 Dorsalgia, unspecified: Secondary | ICD-10-CM

## 2019-12-25 DIAGNOSIS — M5430 Sciatica, unspecified side: Secondary | ICD-10-CM

## 2019-12-30 ENCOUNTER — Other Ambulatory Visit (HOSPITAL_COMMUNITY): Payer: Self-pay | Admitting: Family

## 2019-12-30 DIAGNOSIS — I10 Essential (primary) hypertension: Secondary | ICD-10-CM

## 2019-12-30 DIAGNOSIS — I517 Cardiomegaly: Secondary | ICD-10-CM

## 2020-01-07 ENCOUNTER — Other Ambulatory Visit: Payer: Self-pay | Admitting: Physician Assistant

## 2020-01-07 ENCOUNTER — Inpatient Hospital Stay (HOSPITAL_COMMUNITY)
Admission: EM | Admit: 2020-01-07 | Discharge: 2020-01-09 | DRG: 378 | Disposition: A | Discharge status: Home / Self Care | Payer: Medicare Other | Attending: Internal Medicine | Admitting: Internal Medicine

## 2020-01-07 ENCOUNTER — Emergency Department (HOSPITAL_COMMUNITY): Payer: Medicare Other

## 2020-01-07 DIAGNOSIS — Z96642 Presence of left artificial hip joint: Secondary | ICD-10-CM | POA: Diagnosis present

## 2020-01-07 DIAGNOSIS — Z8616 Personal history of COVID-19: Secondary | ICD-10-CM | POA: Diagnosis not present

## 2020-01-07 DIAGNOSIS — K922 Gastrointestinal hemorrhage, unspecified: Secondary | ICD-10-CM

## 2020-01-07 DIAGNOSIS — K529 Noninfective gastroenteritis and colitis, unspecified: Secondary | ICD-10-CM | POA: Diagnosis present

## 2020-01-07 DIAGNOSIS — F329 Major depressive disorder, single episode, unspecified: Secondary | ICD-10-CM | POA: Diagnosis present

## 2020-01-07 DIAGNOSIS — Z86010 Personal history of colonic polyps: Secondary | ICD-10-CM

## 2020-01-07 DIAGNOSIS — K254 Chronic or unspecified gastric ulcer with hemorrhage: Secondary | ICD-10-CM | POA: Diagnosis present

## 2020-01-07 DIAGNOSIS — K7581 Nonalcoholic steatohepatitis (NASH): Secondary | ICD-10-CM

## 2020-01-07 DIAGNOSIS — K279 Peptic ulcer, site unspecified, unspecified as acute or chronic, without hemorrhage or perforation: Secondary | ICD-10-CM

## 2020-01-07 DIAGNOSIS — Z7289 Other problems related to lifestyle: Secondary | ICD-10-CM | POA: Diagnosis not present

## 2020-01-07 DIAGNOSIS — Z20822 Contact with and (suspected) exposure to covid-19: Secondary | ICD-10-CM | POA: Diagnosis present

## 2020-01-07 DIAGNOSIS — M47814 Spondylosis without myelopathy or radiculopathy, thoracic region: Secondary | ICD-10-CM | POA: Diagnosis not present

## 2020-01-07 DIAGNOSIS — F1410 Cocaine abuse, uncomplicated: Secondary | ICD-10-CM | POA: Diagnosis present

## 2020-01-07 DIAGNOSIS — I8510 Secondary esophageal varices without bleeding: Secondary | ICD-10-CM | POA: Diagnosis present

## 2020-01-07 DIAGNOSIS — Z6833 Body mass index (BMI) 33.0-33.9, adult: Secondary | ICD-10-CM

## 2020-01-07 DIAGNOSIS — M47816 Spondylosis without myelopathy or radiculopathy, lumbar region: Secondary | ICD-10-CM | POA: Diagnosis not present

## 2020-01-07 DIAGNOSIS — K449 Diaphragmatic hernia without obstruction or gangrene: Secondary | ICD-10-CM | POA: Diagnosis present

## 2020-01-07 DIAGNOSIS — K7460 Unspecified cirrhosis of liver: Secondary | ICD-10-CM | POA: Diagnosis not present

## 2020-01-07 DIAGNOSIS — R42 Dizziness and giddiness: Secondary | ICD-10-CM | POA: Diagnosis present

## 2020-01-07 DIAGNOSIS — D649 Anemia, unspecified: Secondary | ICD-10-CM

## 2020-01-07 DIAGNOSIS — E440 Moderate protein-calorie malnutrition: Secondary | ICD-10-CM | POA: Diagnosis present

## 2020-01-07 DIAGNOSIS — Z7951 Long term (current) use of inhaled steroids: Secondary | ICD-10-CM

## 2020-01-07 DIAGNOSIS — E871 Hypo-osmolality and hyponatremia: Secondary | ICD-10-CM | POA: Diagnosis not present

## 2020-01-07 DIAGNOSIS — D500 Iron deficiency anemia secondary to blood loss (chronic): Secondary | ICD-10-CM | POA: Diagnosis not present

## 2020-01-07 DIAGNOSIS — E663 Overweight: Secondary | ICD-10-CM | POA: Diagnosis present

## 2020-01-07 DIAGNOSIS — K259 Gastric ulcer, unspecified as acute or chronic, without hemorrhage or perforation: Secondary | ICD-10-CM | POA: Diagnosis not present

## 2020-01-07 DIAGNOSIS — Z79899 Other long term (current) drug therapy: Secondary | ICD-10-CM | POA: Diagnosis not present

## 2020-01-07 DIAGNOSIS — E785 Hyperlipidemia, unspecified: Secondary | ICD-10-CM | POA: Diagnosis present

## 2020-01-07 DIAGNOSIS — D62 Acute posthemorrhagic anemia: Secondary | ICD-10-CM | POA: Diagnosis present

## 2020-01-07 DIAGNOSIS — F1020 Alcohol dependence, uncomplicated: Secondary | ICD-10-CM | POA: Diagnosis present

## 2020-01-07 DIAGNOSIS — Z550 Illiteracy and low-level literacy: Secondary | ICD-10-CM

## 2020-01-07 DIAGNOSIS — Z87898 Personal history of other specified conditions: Secondary | ICD-10-CM | POA: Diagnosis not present

## 2020-01-07 DIAGNOSIS — K7030 Alcoholic cirrhosis of liver without ascites: Secondary | ICD-10-CM | POA: Diagnosis present

## 2020-01-07 DIAGNOSIS — E538 Deficiency of other specified B group vitamins: Secondary | ICD-10-CM | POA: Diagnosis present

## 2020-01-07 DIAGNOSIS — K5900 Constipation, unspecified: Secondary | ICD-10-CM | POA: Diagnosis present

## 2020-01-07 DIAGNOSIS — F1490 Cocaine use, unspecified, uncomplicated: Secondary | ICD-10-CM | POA: Diagnosis not present

## 2020-01-07 DIAGNOSIS — I10 Essential (primary) hypertension: Secondary | ICD-10-CM | POA: Diagnosis present

## 2020-01-07 DIAGNOSIS — K766 Portal hypertension: Secondary | ICD-10-CM | POA: Diagnosis present

## 2020-01-07 DIAGNOSIS — Z880 Allergy status to penicillin: Secondary | ICD-10-CM

## 2020-01-07 DIAGNOSIS — C61 Malignant neoplasm of prostate: Secondary | ICD-10-CM | POA: Diagnosis present

## 2020-01-07 DIAGNOSIS — Z923 Personal history of irradiation: Secondary | ICD-10-CM

## 2020-01-07 DIAGNOSIS — E611 Iron deficiency: Secondary | ICD-10-CM | POA: Diagnosis not present

## 2020-01-07 DIAGNOSIS — E46 Unspecified protein-calorie malnutrition: Secondary | ICD-10-CM | POA: Diagnosis present

## 2020-01-07 LAB — HEMOGLOBIN AND HEMATOCRIT, BLOOD
HCT: 21.4 % — ABNORMAL LOW (ref 39.0–52.0)
Hemoglobin: 6.9 g/dL — CL (ref 13.0–17.0)

## 2020-01-07 LAB — GLUCOSE, CAPILLARY: Glucose-Capillary: 106 mg/dL — ABNORMAL HIGH (ref 70–99)

## 2020-01-07 LAB — PROTIME-INR
INR: 1.3 — ABNORMAL HIGH (ref 0.8–1.2)
Prothrombin Time: 16.2 seconds — ABNORMAL HIGH (ref 11.4–15.2)

## 2020-01-07 LAB — CBC WITH DIFFERENTIAL/PLATELET
Abs Immature Granulocytes: 0.17 10*3/uL — ABNORMAL HIGH (ref 0.00–0.07)
Basophils Absolute: 0 10*3/uL (ref 0.0–0.1)
Basophils Relative: 0 %
Eosinophils Absolute: 0 10*3/uL (ref 0.0–0.5)
Eosinophils Relative: 0 %
HCT: 15.4 % — ABNORMAL LOW (ref 39.0–52.0)
Hemoglobin: 4.6 g/dL — CL (ref 13.0–17.0)
Immature Granulocytes: 1 %
Lymphocytes Relative: 10 %
Lymphs Abs: 1.2 10*3/uL (ref 0.7–4.0)
MCH: 24.3 pg — ABNORMAL LOW (ref 26.0–34.0)
MCHC: 29.9 g/dL — ABNORMAL LOW (ref 30.0–36.0)
MCV: 81.5 fL (ref 80.0–100.0)
Monocytes Absolute: 1 10*3/uL (ref 0.1–1.0)
Monocytes Relative: 8 %
Neutro Abs: 10.2 10*3/uL — ABNORMAL HIGH (ref 1.7–7.7)
Neutrophils Relative %: 81 %
Platelets: 199 10*3/uL (ref 150–400)
RBC: 1.89 MIL/uL — ABNORMAL LOW (ref 4.22–5.81)
RDW: 18.7 % — ABNORMAL HIGH (ref 11.5–15.5)
WBC: 12.6 10*3/uL — ABNORMAL HIGH (ref 4.0–10.5)
nRBC: 1.7 % — ABNORMAL HIGH (ref 0.0–0.2)

## 2020-01-07 LAB — RAPID URINE DRUG SCREEN, HOSP PERFORMED
Amphetamines: NOT DETECTED
Barbiturates: NOT DETECTED
Benzodiazepines: NOT DETECTED
Cocaine: POSITIVE — AB
Opiates: NOT DETECTED
Tetrahydrocannabinol: POSITIVE — AB

## 2020-01-07 LAB — ETHANOL: Alcohol, Ethyl (B): <10 mg/dL (ref <10)

## 2020-01-07 LAB — COMPREHENSIVE METABOLIC PANEL
ALT: 17 U/L (ref 0–44)
AST: 18 U/L (ref 15–41)
Albumin: 2.5 g/dL — ABNORMAL LOW (ref 3.5–5.0)
Alkaline Phosphatase: 47 U/L (ref 38–126)
Anion gap: 11 (ref 5–15)
BUN: 24 mg/dL — ABNORMAL HIGH (ref 6–20)
CO2: 21 mmol/L — ABNORMAL LOW (ref 22–32)
Calcium: 8.2 mg/dL — ABNORMAL LOW (ref 8.9–10.3)
Chloride: 98 mmol/L (ref 98–111)
Creatinine, Ser: 0.84 mg/dL (ref 0.61–1.24)
GFR calc Af Amer: >60 mL/min (ref >60)
GFR calc non Af Amer: >60 mL/min (ref >60)
Glucose, Bld: 128 mg/dL — ABNORMAL HIGH (ref 70–99)
Potassium: 3.5 mmol/L (ref 3.5–5.1)
Sodium: 130 mmol/L — ABNORMAL LOW (ref 135–145)
Total Bilirubin: 0.7 mg/dL (ref 0.3–1.2)
Total Protein: 5.4 g/dL — ABNORMAL LOW (ref 6.5–8.1)

## 2020-01-07 LAB — IRON AND TIBC
Iron: 25 ug/dL — ABNORMAL LOW (ref 45–182)
Saturation Ratios: 7 % — ABNORMAL LOW (ref 17.9–39.5)
TIBC: 374 ug/dL (ref 250–450)
UIBC: 349 ug/dL

## 2020-01-07 LAB — SARS CORONAVIRUS 2 (TAT 6-24 HRS): SARS Coronavirus 2: NEGATIVE

## 2020-01-07 LAB — URINALYSIS, ROUTINE W REFLEX MICROSCOPIC
Bilirubin Urine: NEGATIVE
Glucose, UA: NEGATIVE mg/dL
Hgb urine dipstick: NEGATIVE
Ketones, ur: 5 mg/dL — AB
Leukocytes,Ua: NEGATIVE
Nitrite: NEGATIVE
Protein, ur: NEGATIVE mg/dL
Specific Gravity, Urine: 1.01 (ref 1.005–1.030)
pH: 5 (ref 5.0–8.0)

## 2020-01-07 LAB — VITAMIN B12: Vitamin B-12: 145 pg/mL — ABNORMAL LOW (ref 180–914)

## 2020-01-07 LAB — RETICULOCYTES
Immature Retic Fract: 47.8 % — ABNORMAL HIGH (ref 2.3–15.9)
RBC.: 1.84 MIL/uL — ABNORMAL LOW (ref 4.22–5.81)
Retic Count, Absolute: 121.6 10*3/uL (ref 19.0–186.0)
Retic Ct Pct: 6.6 % — ABNORMAL HIGH (ref 0.4–3.1)

## 2020-01-07 LAB — MAGNESIUM: Magnesium: 1.9 mg/dL (ref 1.7–2.4)

## 2020-01-07 LAB — FERRITIN: Ferritin: 8 ng/mL — ABNORMAL LOW (ref 24–336)

## 2020-01-07 LAB — PREPARE RBC (CROSSMATCH)

## 2020-01-07 LAB — POC OCCULT BLOOD, ED: Fecal Occult Bld: POSITIVE — AB

## 2020-01-07 LAB — APTT: aPTT: 32 seconds (ref 24–36)

## 2020-01-07 LAB — CBG MONITORING, ED: Glucose-Capillary: 120 mg/dL — ABNORMAL HIGH (ref 70–99)

## 2020-01-07 LAB — PHOSPHORUS: Phosphorus: 3.6 mg/dL (ref 2.5–4.6)

## 2020-01-07 LAB — FOLATE: Folate: 15.6 ng/mL (ref >5.9)

## 2020-01-07 MED ORDER — ADULT MULTIVITAMIN W/MINERALS CH
1.0000 | ORAL_TABLET | Freq: Every day | ORAL | Status: DC
  Administered 2020-01-09: 1 via ORAL
  Filled 2020-01-07: qty 1

## 2020-01-07 MED ORDER — SODIUM CHLORIDE 0.9 % IV BOLUS
1000.0000 mL | Freq: Once | INTRAVENOUS | Status: AC
  Administered 2020-01-07: 1000 mL via INTRAVENOUS

## 2020-01-07 MED ORDER — SODIUM CHLORIDE 0.9% IV SOLUTION: Freq: Once | INTRAVENOUS | Status: AC

## 2020-01-07 MED ORDER — LORAZEPAM 1 MG PO TABS: 1.0000 mg | ORAL_TABLET | ORAL | Status: DC | PRN

## 2020-01-07 MED ORDER — SODIUM CHLORIDE 0.9 % IV SOLN
10.0000 mL/h | Freq: Once | INTRAVENOUS | Status: AC
  Administered 2020-01-09: 10 mL/h via INTRAVENOUS

## 2020-01-07 MED ORDER — THIAMINE HCL 100 MG/ML IJ SOLN
100.0000 mg | Freq: Every day | INTRAMUSCULAR | Status: DC
  Administered 2020-01-07 – 2020-01-09 (×3): 100 mg via INTRAVENOUS
  Filled 2020-01-07 (×3): qty 2

## 2020-01-07 MED ORDER — SODIUM CHLORIDE 0.9 % IV SOLN
510.0000 mg | Freq: Once | INTRAVENOUS | Status: AC
  Administered 2020-01-07: 510 mg via INTRAVENOUS
  Filled 2020-01-07 (×2): qty 17

## 2020-01-07 MED ORDER — PANTOPRAZOLE SODIUM 40 MG IV SOLR
40.0000 mg | Freq: Two times a day (BID) | INTRAVENOUS | Status: DC
  Administered 2020-01-07 – 2020-01-08 (×4): 40 mg via INTRAVENOUS
  Filled 2020-01-07 (×4): qty 40

## 2020-01-07 MED ORDER — TRAMADOL HCL 50 MG PO TABS: 50.0000 mg | ORAL_TABLET | Freq: Four times a day (QID) | ORAL | Status: DC | PRN

## 2020-01-07 MED ORDER — CYANOCOBALAMIN 1000 MCG/ML IJ SOLN
1000.0000 ug | Freq: Once | INTRAMUSCULAR | Status: DC
  Filled 2020-01-07: qty 1

## 2020-01-07 MED ORDER — RAMELTEON 8 MG PO TABS
8.0000 mg | ORAL_TABLET | Freq: Every day | ORAL | Status: DC
  Administered 2020-01-07 – 2020-01-08 (×2): 8 mg via ORAL
  Filled 2020-01-07 (×3): qty 1

## 2020-01-07 MED ORDER — FOLIC ACID 5 MG/ML IJ SOLN
1.0000 mg | Freq: Every day | INTRAMUSCULAR | Status: DC
  Administered 2020-01-08 – 2020-01-09 (×2): 1 mg via INTRAVENOUS
  Filled 2020-01-07 (×3): qty 0.2

## 2020-01-07 MED ORDER — LORAZEPAM 2 MG/ML IJ SOLN: 1.0000 mg | INTRAMUSCULAR | Status: DC | PRN

## 2020-01-07 MED ORDER — VITAMIN B-12 1000 MCG PO TABS
1000.0000 ug | ORAL_TABLET | Freq: Every day | ORAL | Status: DC
  Administered 2020-01-09: 1000 ug via ORAL
  Filled 2020-01-07: qty 1

## 2020-01-07 NOTE — ED Notes (Signed)
Checked on Pt he stated he was doing fine and vitals WDL
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
## 2020-01-07 NOTE — ED Triage Notes (Signed)
Pt arrives via [facility] from home.   Throughout the day pt has had multiple unexplained syncopal episodes.  Pt denies pain or injury from the falls of his episodes.   Pt feels like he will pass out when he stands up   Pt had covid 3 mo ago and endorses SOB upon exertion.   A&ox4

## 2020-01-07 NOTE — Progress Notes (Signed)
CRITICAL VALUE ALERT  Critical Value:  Hemoglobin: 6.9  Date & Time Notied:  01/07/20, 1940  Provider Notified: YES, MD Notified  Orders Received/Actions taken: Awaiting

## 2020-01-07 NOTE — Progress Notes (Signed)
Pt arrived to room 6N05 via stretcher from the ED. Pt ambulated with standby assist to bed. Received report from Vicente Males, Therapist, sports. See assessment. Will continue to monitor.

## 2020-01-08 ENCOUNTER — Inpatient Hospital Stay (HOSPITAL_COMMUNITY): Payer: Medicare Other | Admitting: Certified Registered Nurse Anesthetist

## 2020-01-08 ENCOUNTER — Inpatient Hospital Stay (HOSPITAL_COMMUNITY): Payer: Medicare Other

## 2020-01-08 ENCOUNTER — Other Ambulatory Visit: Payer: Self-pay

## 2020-01-08 ENCOUNTER — Encounter (HOSPITAL_COMMUNITY): Payer: Self-pay | Admitting: Internal Medicine

## 2020-01-08 ENCOUNTER — Encounter (HOSPITAL_COMMUNITY)
Admission: EM | Disposition: A | Discharge status: Home / Self Care | Payer: Self-pay | Source: Home / Self Care | Attending: Internal Medicine

## 2020-01-08 DIAGNOSIS — M47816 Spondylosis without myelopathy or radiculopathy, lumbar region: Secondary | ICD-10-CM

## 2020-01-08 DIAGNOSIS — Z7289 Other problems related to lifestyle: Secondary | ICD-10-CM

## 2020-01-08 DIAGNOSIS — Z8616 Personal history of COVID-19: Secondary | ICD-10-CM

## 2020-01-08 DIAGNOSIS — E46 Unspecified protein-calorie malnutrition: Secondary | ICD-10-CM

## 2020-01-08 DIAGNOSIS — E538 Deficiency of other specified B group vitamins: Secondary | ICD-10-CM

## 2020-01-08 DIAGNOSIS — D62 Acute posthemorrhagic anemia: Secondary | ICD-10-CM

## 2020-01-08 DIAGNOSIS — C61 Malignant neoplasm of prostate: Secondary | ICD-10-CM

## 2020-01-08 DIAGNOSIS — I8510 Secondary esophageal varices without bleeding: Secondary | ICD-10-CM

## 2020-01-08 DIAGNOSIS — Z9889 Other specified postprocedural states: Secondary | ICD-10-CM

## 2020-01-08 DIAGNOSIS — F1490 Cocaine use, unspecified, uncomplicated: Secondary | ICD-10-CM

## 2020-01-08 DIAGNOSIS — E611 Iron deficiency: Secondary | ICD-10-CM

## 2020-01-08 DIAGNOSIS — D649 Anemia, unspecified: Secondary | ICD-10-CM

## 2020-01-08 DIAGNOSIS — M47814 Spondylosis without myelopathy or radiculopathy, thoracic region: Secondary | ICD-10-CM

## 2020-01-08 DIAGNOSIS — Z87898 Personal history of other specified conditions: Secondary | ICD-10-CM

## 2020-01-08 DIAGNOSIS — K259 Gastric ulcer, unspecified as acute or chronic, without hemorrhage or perforation: Secondary | ICD-10-CM

## 2020-01-08 DIAGNOSIS — K254 Chronic or unspecified gastric ulcer with hemorrhage: Principal | ICD-10-CM

## 2020-01-08 DIAGNOSIS — K449 Diaphragmatic hernia without obstruction or gangrene: Secondary | ICD-10-CM

## 2020-01-08 HISTORY — PX: ESOPHAGOGASTRODUODENOSCOPY (EGD) WITH PROPOFOL: SHX5813

## 2020-01-08 HISTORY — PX: BIOPSY: SHX5522

## 2020-01-08 LAB — COMPREHENSIVE METABOLIC PANEL
ALT: 15 U/L (ref 0–44)
AST: 18 U/L (ref 15–41)
Albumin: 2.6 g/dL — ABNORMAL LOW (ref 3.5–5.0)
Alkaline Phosphatase: 51 U/L (ref 38–126)
Anion gap: 7 (ref 5–15)
BUN: 11 mg/dL (ref 6–20)
CO2: 23 mmol/L (ref 22–32)
Calcium: 8.2 mg/dL — ABNORMAL LOW (ref 8.9–10.3)
Chloride: 104 mmol/L (ref 98–111)
Creatinine, Ser: 0.73 mg/dL (ref 0.61–1.24)
GFR calc Af Amer: >60 mL/min (ref >60)
GFR calc non Af Amer: >60 mL/min (ref >60)
Glucose, Bld: 101 mg/dL — ABNORMAL HIGH (ref 70–99)
Potassium: 3.2 mmol/L — ABNORMAL LOW (ref 3.5–5.1)
Sodium: 134 mmol/L — ABNORMAL LOW (ref 135–145)
Total Bilirubin: 1.1 mg/dL (ref 0.3–1.2)
Total Protein: 5.5 g/dL — ABNORMAL LOW (ref 6.5–8.1)

## 2020-01-08 LAB — TYPE AND SCREEN
ABO/RH(D): O POS
Antibody Screen: NEGATIVE
Unit division: 0
Unit division: 0
Unit division: 0

## 2020-01-08 LAB — BPAM RBC
Blood Product Expiration Date: 202104022359
Blood Product Expiration Date: 202104032359
Blood Product Expiration Date: 202104082359
ISSUE DATE / TIME: 202103090939
ISSUE DATE / TIME: 202103091341
ISSUE DATE / TIME: 202103092012
Unit Type and Rh: 5100
Unit Type and Rh: 5100
Unit Type and Rh: 5100

## 2020-01-08 LAB — CBC
HCT: 23.1 % — ABNORMAL LOW (ref 39.0–52.0)
Hemoglobin: 7.6 g/dL — ABNORMAL LOW (ref 13.0–17.0)
MCH: 26.9 pg (ref 26.0–34.0)
MCHC: 32.9 g/dL (ref 30.0–36.0)
MCV: 81.6 fL (ref 80.0–100.0)
Platelets: 206 10*3/uL (ref 150–400)
RBC: 2.83 MIL/uL — ABNORMAL LOW (ref 4.22–5.81)
RDW: 18.3 % — ABNORMAL HIGH (ref 11.5–15.5)
WBC: 13.2 10*3/uL — ABNORMAL HIGH (ref 4.0–10.5)
nRBC: 1.1 % — ABNORMAL HIGH (ref 0.0–0.2)

## 2020-01-08 LAB — AFP TUMOR MARKER: AFP, Serum, Tumor Marker: 5.9 ng/mL (ref 0.0–8.3)

## 2020-01-08 SURGERY — ESOPHAGOGASTRODUODENOSCOPY (EGD) WITH PROPOFOL
Anesthesia: Monitor Anesthesia Care

## 2020-01-08 MED ORDER — IOHEXOL 350 MG/ML SOLN
100.0000 mL | Freq: Once | INTRAVENOUS | Status: AC | PRN
  Administered 2020-01-08: 100 mL via INTRAVENOUS

## 2020-01-08 MED ORDER — POTASSIUM CHLORIDE 10 MEQ/100ML IV SOLN
10.0000 meq | INTRAVENOUS | Status: AC
  Administered 2020-01-08 (×3): 10 meq via INTRAVENOUS
  Filled 2020-01-08 (×3): qty 100

## 2020-01-08 MED ORDER — PROPOFOL 500 MG/50ML IV EMUL
INTRAVENOUS | Status: DC | PRN
  Administered 2020-01-08: 100 ug/kg/min via INTRAVENOUS

## 2020-01-08 MED ORDER — LACTATED RINGERS IV SOLN: INTRAVENOUS | Status: DC

## 2020-01-08 MED ORDER — PROPOFOL 10 MG/ML IV BOLUS
INTRAVENOUS | Status: DC | PRN
  Administered 2020-01-08 (×2): 30 mg via INTRAVENOUS

## 2020-01-08 SURGICAL SUPPLY — 15 items

## 2020-01-08 NOTE — Anesthesia Procedure Notes (Signed)
Procedure Name: MAC Date/Time: 01/08/2020 12:26 PM Performed by: Leonor Liv, CRNA Pre-anesthesia Checklist: Patient identified, Emergency Drugs available, Suction available, Patient being monitored and Timeout performed Patient Re-evaluated:Patient Re-evaluated prior to induction Oxygen Delivery Method: Simple face mask Placement Confirmation: positive ETCO2 Dental Injury: Teeth and Oropharynx as per pre-operative assessment

## 2020-01-08 NOTE — Progress Notes (Signed)
Subjective: HD#1 Events Overnight: Patient admitted overnight.   Admission summary:  On admission he had a Hgb of 4.6, UDS positive for cocaine/THC, Anemia panel showing iron-deficiency anemia (Ferritin 8), low B12 145, FOBT positive.  Patient received a 3 units of PRBC, IV iron and PPI and GI was consulted with plan for EGD/colonoscopy. Started on CIWA with ativan, B12, thiamine, folate supplementation.  Patient was seen this morning on rounds. Patient was resting comfortably in bed. States that he has had some abdominal tenderness and bloating. He denies any hematochezia, melena, or hematemesis. He states that he has a good appetitive.     Objective:  Vital signs in last 24 hours: Vitals:   01/07/20 2048 01/07/20 2321 01/08/20 0002 01/08/20 0447  BP: 139/75 130/65 136/70 130/65  Pulse: 100 96 96 94  Resp: 18 18 18 18   Temp: 99.2 F (37.3 C) 99.4 F (37.4 C) 99.1 F (37.3 C) 99.2 F (37.3 C)  TempSrc: Oral Oral Oral Oral  SpO2: 100% 99% 96% 99%  Weight:      Height:       Supplemental O2: Room air CIWA: 0 (0 ativan)  Physical Exam: Physical Exam  Filed Weights   01/07/20 0433  Weight: 105.7 kg  Weight in 11/2019: 95.3 kg    Intake/Output Summary (Last 24 hours) at 01/08/2020 0536 Last data filed at 01/08/2020 0447 Gross per 24 hour  Intake 907 ml  Output 1350 ml  Net -443 ml    Risk Score:  MELD: 17 pts, 3-4% Child-Pugh Score: pending  Pertinent labs/Imaging: CBC Latest Ref Rng & Units 01/08/2020 01/07/2020 01/07/2020  WBC 4.0 - 10.5 K/uL 13.2(H) - 12.6(H)  Hemoglobin 13.0 - 17.0 g/dL 7.6(L) 6.9(LL) 4.6(LL)  Hematocrit 39.0 - 52.0 % 23.1(L) 21.4(L) 15.4(L)  Platelets 150 - 400 K/uL 206 - 199    CMP Latest Ref Rng & Units 01/08/2020 01/07/2020 10/08/2019  Glucose 70 - 99 mg/dL 101(H) 128(H) 101(H)  BUN 6 - 20 mg/dL 11 24(H) <5(L)  Creatinine 0.61 - 1.24 mg/dL 0.73 0.84 0.84  Sodium 135 - 145 mmol/L 134(L) 130(L) 134(L)  Potassium 3.5 - 5.1 mmol/L 3.2(L)  3.5 4.2  Chloride 98 - 111 mmol/L 104 98 102  CO2 22 - 32 mmol/L 23 21(L) 21(L)  Calcium 8.9 - 10.3 mg/dL 8.2(L) 8.2(L) 8.7(L)  Total Protein 6.5 - 8.1 g/dL 5.5(L) 5.4(L) 6.9  Total Bilirubin 0.3 - 1.2 mg/dL 1.1 0.7 1.5(H)  Alkaline Phos 38 - 126 U/L 51 47 163(H)  AST 15 - 41 U/L 18 18 90(H)  ALT 0 - 44 U/L 15 17 363(H)    DG Chest 2 View  Result Date: 01/07/2020 CLINICAL DATA:  Syncope yesterday with fall EXAM: CHEST - 2 VIEW COMPARISON:  09/30/2019 FINDINGS: Generous heart size with stable mediastinal contours. The heart does not appear enlarged on the lateral view and there is prominent mediastinal fat by recent CT. There is no edema, consolidation, effusion, or pneumothorax. Artifact from EKG leads. IMPRESSION: No evidence of acute disease. Electronically Signed   By: Monte Fantasia M.D.   On: 01/07/2020 07:53    Pending results: 1. Abdominal US  Assessment/Plan:  Active Problems:   Acute GI bleeding   Patient Summary: Chad Turner is a 59 y.o. with pertinent PMH of hepatic cirrhosis without varices, prostate cancer, hiatal hernia, Covid 19 infection in December 2020, DJD of lumbar and thoracic spine, Cocaine use disorder, alcohol use disorder who presented with exertional dyspnea and admit for symptomatic  anemia on hospital day 1  #Symptomatic anemia 2/2 to GI bleed: Patient admits to abdominal fullness and FOBT positive and iron studies positive for Iron deficiency anemia. GI plans to perform an EGD and possible colonoscopy today. S/p 3 units of blood transfused, post transfusion Hgb is 7.6,  - Continue IV iron and PPI - Appreciate GI's assistance.   #Cirrhosis:  GI thinks cirrhosis secondary to [facility] but patient has a significant history of alcohol use disorder.  Hepatitis serologies negative ultrasound and December negative for ascites or portal hypertension.  Patient does have significant abdominal distention and fluid wave positive present on today's exam.  We will  repeat abdominal ultrasound to rule out decompensated cirrhosis.  EGD today to evaluate for GI bleed and possible varices. -Transition of care consulted for alcohol cessation counseling. -Appreciate GIs assistance  #Prostate cancer - Patient will need outpatient follow-up with [facility]  #Substance use disorder, alcohol, cocaine CIWA with Ativan, CIWA scores have been 0 no Ativan has been given Cephulac. He denies any history of alcohol withdrawal seizures. -Continue CIWA with Ativan  #Malnutrition (B12 deficiency, iron deficiency, malnutrition) -Dietitian consulted for nutrition supplementation given his deficiencies.  Continue B12, iron, thiamine, folate supplementation -Keep checking electrolytes  Diet: NPO IVF: None,None VTE: SCDs Code: Full PT/OT recs: Pending TOC recs: Pending   Dispo: Anticipated discharge pending clinical improvement.    Marianna Payment, D.O. [facility], PGY-1 Date 01/08/2020 Time 5:36 AM

## 2020-01-08 NOTE — Transfer of Care (Signed)
Immediate Anesthesia Transfer of Care Note  Patient: Chad Turner  Procedure(s) Performed: ESOPHAGOGASTRODUODENOSCOPY (EGD) WITH PROPOFOL (N/A ) BIOPSY  Patient Location: Endoscopy Unit  Anesthesia Type:MAC  Level of Consciousness: awake, alert  and oriented  Airway & Oxygen Therapy: Patient Spontanous Breathing and Patient connected to nasal cannula oxygen  Post-op Assessment: Report given to RN, Post -op Vital signs reviewed and stable and Patient moving all extremities  Post vital signs: Reviewed and stable  Last Vitals:  Vitals Value Taken Time  BP 105/58 01/08/20 1259  Temp    Pulse 82 01/08/20 1301  Resp 27 01/08/20 1301  SpO2 98 % 01/08/20 1301  Vitals shown include unvalidated device data.  Last Pain:  Vitals:   01/08/20 1202  TempSrc:   PainSc: 0-No pain         Complications: No apparent anesthesia complications

## 2020-01-08 NOTE — Op Note (Signed)
[facility] Patient Name: Chad Turner Procedure Date : 01/08/2020 MRN: [id number] Attending MD: Justice Britain , MD Date of Birth: 10/19/1961 CSN: [id number] Age: 59 Admit Type: Inpatient Procedure:                Upper GI endoscopy Indications:              Iron deficiency anemia secondary to chronic blood                            loss, Iron deficiency anemia, Cirrhosis rule out                            esophageal varices, Dark stools Providers:                Justice Britain, MD, Carlyn Reichert, RN, William Dalton, Technician Referring MD:             Mauri Pole, MD, Azucena Freed PA, PA, Jerene Bears, MD, [facility] Medicines:                Monitored Anesthesia Care Complications:            No immediate complications. Estimated Blood Loss:     Estimated blood loss was minimal. Procedure:                Pre-Anesthesia Assessment:                           - Prior to the procedure, a History and Physical                            was performed, and patient medications and                            allergies were reviewed. The patient's tolerance of                            previous anesthesia was also reviewed. The risks                            and benefits of the procedure and the sedation                            options and risks were discussed with the patient.                            All questions were answered, and informed consent                            was obtained. Prior Anticoagulants: The patient has  taken no previous anticoagulant or antiplatelet                            agents. ASA Grade Assessment: III - A patient with                            severe systemic disease. After reviewing the risks                            and benefits, the patient was deemed in                            satisfactory condition to undergo the  procedure.                           After obtaining informed consent, the endoscope was                            passed under direct vision. Throughout the                            procedure, the patient's blood pressure, pulse, and                            oxygen saturations were monitored continuously. The                            GIF-H190 ([id number]) Olympus gastroscope was                            introduced through the mouth, and advanced to the                            second part of duodenum. The upper GI endoscopy was                            accomplished without difficulty. The patient                            tolerated the procedure. Scope In: Scope Out: Findings:      No gross lesions were noted in the proximal esophagus and in the mid       esophagus.      Grade I, small (< 5 mm) varices were found in the distal esophagus -       there are no stigmata, these are new from recent EGD.      A small hiatal hernia was found. The proximal extent of the gastric       folds (end of tubular esophagus) was 45 cm from the incisors. The hiatal       narrowing was 47 cm from the incisors. The Z-line was 45 cm from the       incisors.      One large non-bleeding cratered gastric ulcer with a clean ulcer base       (Forrest Class III) and pigment that washed away  was found on the       greater curvature of the stomach. The lesion was 25 mm in largest       dimension.      Many non-obstructing non-bleeding cratered, linear, and superficial       gastric ulcers of moderate severity with a clean ulcer base (Forrest       Class III) were found in the gastric body, at the incisura and in the       gastric antrum. There is no evidence of perforation. These are all new       from recent EGD.      Patchy mildly erythematous mucosa without bleeding was found in the       gastric body and in the gastric antrum. Biopsies were taken with a cold       forceps for histology and  Helicobacter pylori testing.      Mild mucosal changes characterized by granularity, nodularity and       altered texture were found in the duodenal bulb. Biopsies were taken       with a cold forceps for histology.      No gross lesions were noted in the duodenal bulb, in the first portion       of the duodenum and in the second portion of the duodenum. Impression:               - No gross lesions in esophagus.                           - Grade I and small (< 5 mm) esophageal varices.                           - Small hiatal hernia.                           - Non-bleeding gastric ulcer with a clean ulcer                            base (Forrest Class III).                           - Non-obstructing non-bleeding gastric ulcers with                            a clean ulcer base (Forrest Class III). There is no                            evidence of perforation.                           - Erythematous mucosa in the gastric body and                            antrum. Biopsied.                           - Mucosal changes in the duodenum. Biopsied.                           -  No gross lesions in the duodenal bulb, in the                            first portion of the duodenum and in the second                            portion of the duodenum. Recommendation:           - The patient will be observed post-procedure,                            until all discharge criteria are met.                           - Return patient to hospital ward for ongoing care.                           - Full liquid diet.                           - IV PPI x 48 more hours. Then may transition to PO                            PPI BID for next 80-month and then PPI indefinitely.                           - Await pathology results.                           - Query sending Gastrin level (it may be elevated                            due to PPI but if very high need to consider                             potential for other etiologies).                           - Consider cross-sectional imaging of the abdomen.                           - Consider NSBB for primary prevention of variceal                            progression/bleeding.                           - Minimize NSAID use as able.                           - Repeat upper endoscopy in 2-3 months to check                            healing with primary GI.                           -  I am not sure that these findings expain the                            patient's more chronic anemia which was occuring                            from last year into this year. However, there is no                            doubt these findings are etiology of the patient's                            acute anemia.                           - If evidence of adenomatous tissue on biopsies                            then consider role of endoscopic resection.                           - If HP is found then needs treatment.                           - The findings and recommendations were discussed                            with the patient.                           - The findings and recommendations were discussed                            with the referring physician. Procedure Code(s):        --- Professional ---                           [phone], Esophagogastroduodenoscopy, flexible,                            transoral; with biopsy, single or multiple Diagnosis Code(s):        --- Professional ---                           K74.60, Unspecified cirrhosis of liver                           I85.10, Secondary esophageal varices without                            bleeding                           K44.9, Diaphragmatic hernia without obstruction or  gangrene                           K25.9, Gastric ulcer, unspecified as acute or                            chronic, without hemorrhage or perforation                            K31.89, Other diseases of stomach and duodenum                           D50.0, Iron deficiency anemia secondary to blood                            loss (chronic)                           D50.9, Iron deficiency anemia, unspecified CPT copyright 2019 American Medical Association. All rights reserved. The codes documented in this report are preliminary and upon coder review may  be revised to meet current compliance requirements. Justice Britain, MD 01/08/2020 1:12:45 PM Number of Addenda: 0

## 2020-01-08 NOTE — Progress Notes (Signed)
Internal Medicine Attending Note:  I have seen and evaluated this patient and I have discussed the plan of care with the house staff. Please see their note for complete details. I concur with their findings.  Patient is a 59 yo M with pmhx of hepatic cirrhosis, prostate cancer, hiatal hernia, recent COVID19 infection in December, cocaine and alcohol use disorder admitted for symptomatic anemia, found to have a hemoglobin of 4.6. He was transfused 3 units of pRBC with improvement in his hemoglobin to 7.6. He has remained hemodynamically stable. He is now s/p upper endoscopy per GI found to have one large non bleeding gastric ulcer and many smaller linear ulcers of moderate severity. Plan is for full liquid diet, IV PPI for an additional 48 hours then PO BID x 3 months. GI recommends checking a gastrin level, CT abdomen / pelvis, and NSBB for small grade 1 varices also seen on EGD. Will place these orders and start propranolol on discharge.   [name], MD 01/08/2020, 1:36 PM
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
## 2020-01-08 NOTE — Anesthesia Preprocedure Evaluation (Addendum)
Anesthesia Evaluation  Patient identified by MRN, date of birth, ID band Patient awake    Reviewed: Allergy & Precautions, H&P , NPO status , Patient's Chart, lab work & pertinent test results  Airway Mallampati: III  TM Distance: >3 FB Neck ROM: Full    Dental no notable dental hx. (+) Teeth Intact, Dental Advisory Given   Pulmonary neg pulmonary ROS,    Pulmonary exam normal breath sounds clear to auscultation       Cardiovascular hypertension, Pt. on medications  Rhythm:Regular Rate:Normal     Neuro/Psych Depression negative neurological ROS     GI/Hepatic negative GI ROS, (+)     substance abuse  alcohol use and marijuana use, Hepatitis -, C  Endo/Other  negative endocrine ROS  Renal/GU negative Renal ROS  negative genitourinary   Musculoskeletal  (+) Arthritis , Osteoarthritis,    Abdominal   Peds  Hematology negative hematology ROS (+)   Anesthesia Other Findings   Reproductive/Obstetrics negative OB ROS                            Anesthesia Physical Anesthesia Plan  ASA: III  Anesthesia Plan: MAC   Post-op Pain Management:    Induction: Intravenous  PONV Risk Score and Plan: 1 and Propofol infusion  Airway Management Planned: Nasal Cannula  Additional Equipment:   Intra-op Plan:   Post-operative Plan:   Informed Consent: I have reviewed the patients History and Physical, chart, labs and discussed the procedure including the risks, benefits and alternatives for the proposed anesthesia with the patient or authorized representative who has indicated his/her understanding and acceptance.     Dental advisory given  Plan Discussed with: CRNA  Anesthesia Plan Comments:         Anesthesia Quick Evaluation

## 2020-01-08 NOTE — Anesthesia Postprocedure Evaluation (Signed)
Anesthesia Post Note  Patient: Chad Turner  Procedure(s) Performed: ESOPHAGOGASTRODUODENOSCOPY (EGD) WITH PROPOFOL (N/A ) BIOPSY     Patient location during evaluation: Endoscopy Anesthesia Type: MAC Level of consciousness: awake and alert Pain management: pain level controlled Vital Signs Assessment: post-procedure vital signs reviewed and stable Respiratory status: spontaneous breathing, nonlabored ventilation and respiratory function stable Cardiovascular status: stable and blood pressure returned to baseline Postop Assessment: no apparent nausea or vomiting Anesthetic complications: no    Last Vitals:  Vitals:   01/08/20 1310 01/08/20 1320  BP: 118/63 128/65  Pulse: 86 89  Resp: 17 18  Temp:    SpO2: 100% 99%    Last Pain:  Vitals:   01/08/20 1320  TempSrc:   PainSc: 0-No pain                 Chad Turner,W. EDMOND

## 2020-01-09 ENCOUNTER — Encounter: Payer: Self-pay | Admitting: *Deleted

## 2020-01-09 DIAGNOSIS — K7460 Unspecified cirrhosis of liver: Secondary | ICD-10-CM

## 2020-01-09 DIAGNOSIS — K259 Gastric ulcer, unspecified as acute or chronic, without hemorrhage or perforation: Secondary | ICD-10-CM

## 2020-01-09 DIAGNOSIS — Z880 Allergy status to penicillin: Secondary | ICD-10-CM

## 2020-01-09 DIAGNOSIS — K279 Peptic ulcer, site unspecified, unspecified as acute or chronic, without hemorrhage or perforation: Secondary | ICD-10-CM

## 2020-01-09 DIAGNOSIS — D649 Anemia, unspecified: Secondary | ICD-10-CM

## 2020-01-09 DIAGNOSIS — K7030 Alcoholic cirrhosis of liver without ascites: Secondary | ICD-10-CM

## 2020-01-09 DIAGNOSIS — Z87898 Personal history of other specified conditions: Secondary | ICD-10-CM

## 2020-01-09 DIAGNOSIS — E871 Hypo-osmolality and hyponatremia: Secondary | ICD-10-CM

## 2020-01-09 LAB — COMPREHENSIVE METABOLIC PANEL
ALT: 18 U/L (ref 0–44)
AST: 23 U/L (ref 15–41)
Albumin: 2.7 g/dL — ABNORMAL LOW (ref 3.5–5.0)
Alkaline Phosphatase: 53 U/L (ref 38–126)
Anion gap: 6 (ref 5–15)
BUN: <5 mg/dL — ABNORMAL LOW (ref 6–20)
CO2: 21 mmol/L — ABNORMAL LOW (ref 22–32)
Calcium: 8.4 mg/dL — ABNORMAL LOW (ref 8.9–10.3)
Chloride: 105 mmol/L (ref 98–111)
Creatinine, Ser: 0.78 mg/dL (ref 0.61–1.24)
GFR calc Af Amer: >60 mL/min (ref >60)
GFR calc non Af Amer: >60 mL/min (ref >60)
Glucose, Bld: 109 mg/dL — ABNORMAL HIGH (ref 70–99)
Potassium: 3.2 mmol/L — ABNORMAL LOW (ref 3.5–5.1)
Sodium: 132 mmol/L — ABNORMAL LOW (ref 135–145)
Total Bilirubin: 0.5 mg/dL (ref 0.3–1.2)
Total Protein: 5.8 g/dL — ABNORMAL LOW (ref 6.5–8.1)

## 2020-01-09 LAB — SODIUM, URINE, RANDOM: Sodium, Ur: 110 mmol/L

## 2020-01-09 LAB — CBC
HCT: 24.6 % — ABNORMAL LOW (ref 39.0–52.0)
Hemoglobin: 7.9 g/dL — ABNORMAL LOW (ref 13.0–17.0)
MCH: 27 pg (ref 26.0–34.0)
MCHC: 32.1 g/dL (ref 30.0–36.0)
MCV: 84 fL (ref 80.0–100.0)
Platelets: 249 10*3/uL (ref 150–400)
RBC: 2.93 MIL/uL — ABNORMAL LOW (ref 4.22–5.81)
RDW: 18.3 % — ABNORMAL HIGH (ref 11.5–15.5)
WBC: 10.5 10*3/uL (ref 4.0–10.5)
nRBC: 0.6 % — ABNORMAL HIGH (ref 0.0–0.2)

## 2020-01-09 LAB — OSMOLALITY: Osmolality: 278 mOsm/kg (ref 275–295)

## 2020-01-09 LAB — PROTIME-INR
INR: 1.2 (ref 0.8–1.2)
Prothrombin Time: 14.8 seconds (ref 11.4–15.2)

## 2020-01-09 LAB — OSMOLALITY, URINE: Osmolality, Ur: 349 mOsm/kg (ref 300–900)

## 2020-01-09 MED ORDER — POTASSIUM CHLORIDE 10 MEQ/100ML IV SOLN: 10.0000 meq | INTRAVENOUS | Status: DC

## 2020-01-09 MED ORDER — SENNA 8.6 MG PO TABS
1.0000 | ORAL_TABLET | Freq: Every day | ORAL | Status: DC
  Administered 2020-01-09: 8.6 mg via ORAL
  Filled 2020-01-09: qty 1

## 2020-01-09 MED ORDER — PANTOPRAZOLE SODIUM 40 MG PO TBEC: 40.0000 mg | DELAYED_RELEASE_TABLET | Freq: Two times a day (BID) | ORAL | 3 refills | Status: DC

## 2020-01-09 MED ORDER — POLYETHYLENE GLYCOL 3350 17 G PO PACK
17.0000 g | PACK | Freq: Two times a day (BID) | ORAL | Status: DC
  Administered 2020-01-09: 17 g via ORAL
  Filled 2020-01-09: qty 1

## 2020-01-09 MED ORDER — PROPRANOLOL HCL 20 MG PO TABS
10.0000 mg | ORAL_TABLET | Freq: Two times a day (BID) | ORAL | Status: DC
  Administered 2020-01-09: 10 mg via ORAL
  Filled 2020-01-09: qty 1

## 2020-01-09 MED ORDER — POTASSIUM CHLORIDE 20 MEQ PO PACK
20.0000 meq | PACK | Freq: Once | ORAL | Status: AC
  Administered 2020-01-09: 20 meq via ORAL
  Filled 2020-01-09: qty 1

## 2020-01-09 MED ORDER — PANTOPRAZOLE SODIUM 40 MG IV SOLR
40.0000 mg | Freq: Two times a day (BID) | INTRAVENOUS | Status: AC
  Administered 2020-01-09 (×2): 40 mg via INTRAVENOUS
  Filled 2020-01-09 (×2): qty 40

## 2020-01-09 MED ORDER — PANTOPRAZOLE SODIUM 40 MG PO TBEC: 40.0000 mg | DELAYED_RELEASE_TABLET | Freq: Two times a day (BID) | ORAL | Status: DC

## 2020-01-09 MED ORDER — NADOLOL 20 MG PO TABS: 20.0000 mg | ORAL_TABLET | Freq: Every day | ORAL | 0 refills | Status: DC

## 2020-01-09 MED ORDER — POTASSIUM CHLORIDE 10 MEQ/100ML IV SOLN
10.0000 meq | INTRAVENOUS | Status: DC
  Administered 2020-01-09 (×2): 10 meq via INTRAVENOUS
  Filled 2020-01-09 (×2): qty 100

## 2020-01-09 MED FILL — NADOLOL 20 MG TAB: 20 | 60 days supply | Qty: 60 | Fill #0

## 2020-01-09 NOTE — Progress Notes (Signed)
Discharged pt to home, AVS instructions given and explained. Questions answered to patient's satisfaction and belongings returned accordingly.
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
## 2020-01-09 NOTE — Progress Notes (Signed)
Subjective: HD#2 Events Overnight:   Patient was seen this morning on rounds. He states that he feels better today than yesterday. He was updated on the results of his EGD and he expresses understanding.   Objective:  Vital signs in last 24 hours: Vitals:   01/08/20 1320 01/08/20 1451 01/08/20 2026 01/09/20 0533  BP: 128/65 126/73 131/80 129/69  Pulse: 89 89 87 93  Resp: 18 16 17 17   Temp:  98.1 F (36.7 C) 97.6 F (36.4 C) 98.2 F (36.8 C)  TempSrc:  Oral Oral Oral  SpO2: 99% 100% 100% 97%  Weight:      Height:       Supplemental O2: RA CIWA: 0  Physical Exam: Physical Exam  Filed Weights   01/07/20 0433 01/08/20 1202  Weight: 105.7 kg 105.7 kg     Intake/Output Summary (Last 24 hours) at 01/09/2020 0700 Last data filed at 01/09/2020 0539 Gross per 24 hour  Intake 1000 ml  Output 1002 ml  Net -2 ml    Risk Score:  Child-Pugh: B MELD: 15 pts, <2%   Pertinent labs/Imaging: CBC Latest Ref Rng & Units 01/09/2020 01/08/2020 01/07/2020  WBC 4.0 - 10.5 K/uL 10.5 13.2(H) -  Hemoglobin 13.0 - 17.0 g/dL 7.9(L) 7.6(L) 6.9(LL)  Hematocrit 39.0 - 52.0 % 24.6(L) 23.1(L) 21.4(L)  Platelets 150 - 400 K/uL 249 206 -    CMP Latest Ref Rng & Units 01/09/2020 01/08/2020 01/07/2020  Glucose 70 - 99 mg/dL 109(H) 101(H) 128(H)  BUN 6 - 20 mg/dL <5(L) 11 24(H)  Creatinine 0.61 - 1.24 mg/dL 0.78 0.73 0.84  Sodium 135 - 145 mmol/L 132(L) 134(L) 130(L)  Potassium 3.5 - 5.1 mmol/L 3.2(L) 3.2(L) 3.5  Chloride 98 - 111 mmol/L 105 104 98  CO2 22 - 32 mmol/L 21(L) 23 21(L)  Calcium 8.9 - 10.3 mg/dL 8.4(L) 8.2(L) 8.2(L)  Total Protein 6.5 - 8.1 g/dL 5.8(L) 5.5(L) 5.4(L)  Total Bilirubin 0.3 - 1.2 mg/dL 0.5 1.1 0.7  Alkaline Phos 38 - 126 U/L 53 51 47  AST 15 - 41 U/L 23 18 18   ALT 0 - 44 U/L 18 15 17      CTA A/P: 1. Evaluation for active GI bleeding is limited by the presence of hyperdense material within portions of the colon. Given this limitation, no active GI bleeding was  detected.  2. Colonic diverticulosis without CT evidence for diverticulitis.  3. Cirrhosis.  4. There is cholelithiasis without secondary signs of acute cholecystitis. Aortic Atherosclerosis   US Abdomen: 1. Nodular contour of the liver, in keeping with history of cirrhosis. No focal liver lesion identified.  2.  No significant ascites.  3.  Cholelithiasis.  Assessment/Plan:  Active Problems:   Acute GI bleeding   Symptomatic anemia   History of alcohol use disorder   Patient Summary: Chad Turner is a 59 y.o. with pertinent PMH of  hepatic cirrhosis without varices, prostate cancer,hiatal hernia,Covid 19 infection in December 2020, DJD of lumbar and thoracic spine, Cocaine use disorder, alcohol use disorder who presented with exertional dyspnea and admit for symptomatic anemia and admit for GI bleed and gastric Ulcers on hospital day 2  #Gastric Ulcers #GI bleed GI performed endoscopy yesterday showing large non-bleeding gastric ulcer. CT did not show source of bleed. Hgb stable today.  - Continue PPI for 48 hours and switch to PO PPI for 3 months/Indefinitly. Switch today  #Cirrhosis: #Esophageal varices: Recent EGD showed small non-bleeding varices. - Restart Nadolol at discharge.  #Substance  use disorder, alcohol, cocaine - Continue Thiamine supplimentation  #Malnutrition (B12 deficiency, iron deficiency, malnutrition) - Continue B12, Iron, thiamine, folate supplementation  - Replete electrolytes as needed  #Hyponatremia: Likely 2/2 to cirrhosis. Labs pending.   Diet: Full liquids IVF: None,None VTE: SCDs Code: Full PT/OT recs: Pending TOC recs: pending   Dispo: Anticipated discharge today or tomorrow.    Marianna Payment, D.O. [facility], PGY-1 Date 01/09/2020 Time 7:00 AM

## 2020-01-09 NOTE — Plan of Care (Signed)
  Problem: Education: Goal: Knowledge of General Education information will improve Description Including pain rating scale, medication(s)/side effects and non-pharmacologic comfort measures Outcome: Progressing   

## 2020-01-09 NOTE — Progress Notes (Signed)
Internal Medicine Attending Note:  I have seen and evaluated this patient and I have discussed the plan of care with the house staff. Please see their note for complete details. I concur with their findings.  Patient is insistent on discharge. He is agreeable to staying for his last dose of IV PPI this afternoon and discharging this evening. Will start PO Protonix tomorrow. Appreciate GI, follow up has been arranged.   [name], MD 01/09/2020, 1:36 PM

## 2020-01-09 NOTE — Progress Notes (Addendum)
Daily Rounding Note  01/09/2020, 9:44 AM  LOS: 2 days   SUBJECTIVE:   Chief complaint: anemia, FOBT +, cirrhosis, portal htn, non-bleeding varices, gastric ulcers.      VSS.  Stool is formed, dark in color.  He had not had a bowel movement in a few days.  He had been using bismuth PTA. Pt tells me the plan is for him to discharge home later today.  He has been walking around in the hallway and is not dizzy, short of breath.  No abdominal pain.  No nausea vomiting.  He feels great.  OBJECTIVE:         Vital signs in last 24 hours:    Temp:  [97.6 F (36.4 C)-98.5 F (36.9 C)] 98.2 F (36.8 C) (03/11 0533) Pulse Rate:  [79-93] 93 (03/11 0533) Resp:  [16-25] 17 (03/11 0533) BP: (105-135)/(58-80) 129/69 (03/11 0533) SpO2:  [96 %-100 %] 97 % (03/11 0533) Weight:  [105.7 kg] 105.7 kg (03/10 1202) Last BM Date: 01/07/20 Filed Weights   01/07/20 0433 01/08/20 1202  Weight: 105.7 kg 105.7 kg   General: Looks well.  Overweight.  Comfortable. Heart: RRR Chest: Clear bilaterally.  No labored breathing or cough. Abdomen: Soft.  Nontender, nondistended.  Active bowel sounds. Extremities: No CCE. Neuro/Psych: Oriented x3.  No asterixis.  Moves all 4 limbs without gross deficits.  Intake/Output from previous day: 03/10 0701 - 03/11 0700 In: 1000 [P.O.:800; I.V.:200] Out: 1002 [Urine:1000; Blood:2]  Intake/Output this shift: Total I/O In: -  Out: 525 [Urine:525]  Lab Results: Recent Labs    01/07/20 0714 01/07/20 0714 01/07/20 1900 01/08/20 0207 01/09/20 0314  WBC 12.6*  --   --  13.2* 10.5  HGB 4.6*   < > 6.9* 7.6* 7.9*  HCT 15.4*   < > 21.4* 23.1* 24.6*  PLT 199  --   --  206 249   < > = values in this interval not displayed.   BMET Recent Labs    01/07/20 0714 01/08/20 0207 01/09/20 0314  NA 130* 134* 132*  K 3.5 3.2* 3.2*  CL 98 104 105  CO2 21* 23 21*  GLUCOSE 128* 101* 109*  BUN 24* 11 <5*    CREATININE 0.84 0.73 0.78  CALCIUM 8.2* 8.2* 8.4*   LFT Recent Labs    01/07/20 0714 01/08/20 0207 01/09/20 0314  PROT 5.4* 5.5* 5.8*  ALBUMIN 2.5* 2.6* 2.7*  AST 18 18 23   ALT 17 15 18   ALKPHOS 47 51 53  BILITOT 0.7 1.1 0.5   PT/INR Recent Labs    01/07/20 0838 01/09/20 0314  LABPROT 16.2* 14.8  INR 1.3* 1.2   Hepatitis Panel No results for input(s): HEPBSAG, HCVAB, HEPAIGM, HEPBIGM in the last 72 hours.  Studies/Results: CT Angio Abd/Pel w/ and/or w/o  Result Date: 01/08/2020 CLINICAL DATA:  GI bleeding EXAM: CTA ABDOMEN AND PELVIS WITHOUT AND WITH CONTRAST TECHNIQUE: Multidetector CT imaging of the abdomen and pelvis was performed using the standard protocol during bolus administration of intravenous contrast. Multiplanar reconstructed images and MIPs were obtained and reviewed to evaluate the vascular anatomy. CONTRAST:  161m OMNIPAQUE IOHEXOL  350 MG/ML SOLN COMPARISON:  06/20/2018. 08/20/2018. FINDINGS: VASCULAR Aorta: Normal caliber aorta without aneurysm, dissection, vasculitis or significant stenosis. Celiac: Patent without evidence of aneurysm, dissection, vasculitis or significant stenosis. SMA: Patent without evidence of aneurysm, dissection, vasculitis or significant stenosis. Renals: Both renal arteries are patent without evidence of aneurysm, dissection, vasculitis, fibromuscular dysplasia or significant stenosis. IMA: Patent without evidence of aneurysm, dissection, vasculitis or significant stenosis. Inflow: Patent without evidence of aneurysm, dissection, vasculitis or significant stenosis. Proximal Outflow: Bilateral common femoral and visualized portions of the superficial and profunda femoral arteries are patent without evidence of aneurysm, dissection, vasculitis or significant stenosis. Veins: No obvious venous abnormality within the limitations of this arterial phase study. Review of the MIP images confirms the above findings. NON-VASCULAR Lower chest: The lung  bases are clear. The heart size is normal. Hepatobiliary: The liver contour somewhat nodular in appearance. Cholelithiasis without acute inflammation.There is no biliary ductal dilation. Pancreas: Normal contours without ductal dilatation. No peripancreatic fluid collection. Spleen: No splenic laceration or hematoma. Adrenals/Urinary Tract: --Adrenal glands: No adrenal hemorrhage. --Right kidney/ureter: No hydronephrosis or perinephric hematoma. --Left kidney/ureter: There is an at least partially duplicated left renal collecting system. --Urinary bladder: Unremarkable. Stomach/Bowel: --Stomach/Duodenum: No hiatal hernia or other gastric abnormality. Normal duodenal course and caliber. --Small bowel: No dilatation or inflammation. --Colon: Rectosigmoid diverticulosis without acute inflammation. Hyperdense material is noted within the lumen of the colon which limits evaluation for active GI bleeding. Given this limitation, no active bleeding was detected. --Appendix: Normal. Lymphatic: --No retroperitoneal lymphadenopathy. --there are prominent lymph nodes in the region of the gastrohepatic ligament. These are essentially stable across prior studies. --No pelvic or inguinal lymphadenopathy. Reproductive: Unremarkable Other: No ascites or free air. There is a fat containing umbilical hernia. Musculoskeletal. No acute displaced fractures. IMPRESSION: 1. Evaluation for active GI bleeding is limited by the presence of hyperdense material within portions of the colon. Given this limitation, no active GI bleeding was detected. 2. Colonic diverticulosis without CT evidence for diverticulitis. 3. Cirrhosis. 4. There is cholelithiasis without secondary signs of acute cholecystitis. Aortic Atherosclerosis (ICD10-I70.0). Electronically Signed   By: Constance Holster M.D.   On: 01/08/2020 21:02   US Abdomen Limited RUQ  Result Date: 01/08/2020 CLINICAL DATA:  Cirrhosis EXAM: ULTRASOUND ABDOMEN LIMITED RIGHT UPPER QUADRANT  COMPARISON:  10/03/2019 FINDINGS: Gallbladder: Mobile gallstones in the gallbladder. No sonographic Murphy sign noted by sonographer. Common bile duct: Diameter: 5 mm Liver: No focal lesion identified. Nodular contour of the liver. Within normal limits in parenchymal echogenicity. Portal vein is patent on color Doppler imaging with normal direction of blood flow towards the liver. Other: None.  No ascites noted. IMPRESSION: 1. Nodular contour of the liver, in keeping with history of cirrhosis. No focal liver lesion identified. 2.  No significant ascites. 3.  Cholelithiasis. Electronically Signed   By: Eddie Candle M.D.   On: 01/08/2020 11:47   Scheduled Meds: . cyanocobalamin  1,000 mcg Intramuscular Once   Followed by  . vitamin B-12  1,000 mcg Oral Daily  . folic acid  1 mg Intravenous Daily  . multivitamin with minerals  1 tablet Oral Daily  . pantoprazole (PROTONIX) IV  40 mg Intravenous Q12H  . propranolol  10 mg Oral BID  . ramelteon  8 mg Oral QHS  . thiamine injection  100 mg Intravenous Daily   Continuous Infusions: PRN Meds:.LORazepam **OR** LORazepam, traMADol  ASSESMENT:   *   FOBT positive, progressive/symptomatic iron (ferritin 8) and B12 (145)  deficiency anemia. Heavy NSAID up until a few weeks PTA. Hgb 4.6 >> 3 PRBCs >> 7.9.   01/08/20 EGD: Grade 1 esophageal varices.  Small HH.  Nonbleeding, clean based, nonobstructing gastric ulcers, gastritis.  Nodular duodenal mucosa, biopsied. Interestingly, only [place] seen on EGD 11/15/2019, 2 months ago. 12/2018 colonoscopy, screening study: Sigmoid, sessile polyp, pathology showed focal active colitis, no dysplasia. Currently on daily IV vitamin B12.  Feraheme 10/04/19, 01/08/20.    *    Cirrhosis.  Likely alcoholic in pt with alcoholism.  HCV, HBV negative. Coagulopathic 10/2019, resolved as of this admission. AFP 5.9.  Simple cholelithiasis but no significant ascites, no evidence of HCC on 01/08/2020 ultrasound, 01/08/20 CTAP w angio.     *   Hyponatremia, persists.  *   Illiterate.  Unable to read.  *    Hx prostate cancer diagnosed 07/2018.   PLAN   *   Patient is taking a medicine called ramelteon, search this out and its indication is insomnia but it has a caution in setting of severe hepatic impairment, do we want to continue this medication.  *    Protonix 40 mg po bid at discharge.  *   Await duod path.  GI office can contact patient if he needs treatment for H. pylori.  Patient has follow-up appointment with GI PA on 4/1 at 9 AM.   Should have recheck of CBC in 7 to 10 days, this can be done through his PMD at [facility].  *    Regular diet.    Azucena Freed  01/09/2020, 9:44 AM Phone [phone]   Attending physician's note   I have taken an interval history, reviewed the chart and examined the patient. I agree with the Advanced Practitioner's note, impression and recommendations.   Compensated cirrhosis  Multiple gastric ulcers on EGD 01/08/2020 Continue PPI twice daily for 3 months Follow-up H. pylori status Avoid NSAIDs Alcohol cessation  GI office follow-up in 2 months  Okay to discharge home from GI standpoint  K. Denzil Magnuson , MD [phone]

## 2020-01-10 ENCOUNTER — Other Ambulatory Visit: Payer: Self-pay

## 2020-01-10 DIAGNOSIS — Z5189 Encounter for other specified aftercare: Secondary | ICD-10-CM

## 2020-01-10 HISTORY — DX: Encounter for other specified aftercare: Z51.89

## 2020-01-10 LAB — SURGICAL PATHOLOGY

## 2020-01-10 MED ORDER — PYLERA 140-125-125 MG PO CAPS: 3.0000 | ORAL_CAPSULE | Freq: Three times a day (TID) | ORAL | 0 refills | Status: DC

## 2020-01-12 NOTE — Discharge Summary (Signed)
Name: Chad Turner MRN: [id number] DOB: 25-Aug-1961 59 y.o. PCP: Sonia Side., FNP  Date of Admission: 01/07/2020  4:26 AM Date of Discharge: 01/09/2020 Attending Physician: [name], MD  Discharge Diagnosis: 1.  GI bleed secondary to gastric ulcer  Discharge Medications: Allergies as of 01/09/2020      Reactions   Penicillins Swelling   Facial swelling Has patient had a PCN reaction causing immediate rash, facial/tongue/throat swelling, SOB or lightheadedness with hypotension: Yes Has patient had a PCN reaction causing severe rash involving mucus membranes or skin necrosis: No Has patient had a PCN reaction that required hospitalization: Already there Has patient had a PCN reaction occurring within the last 10 years: No If all of the above answers are "NO", then may proceed with Cephalosporin use.      Medication List    STOP taking these medications   ibuprofen 800 MG tablet Commonly known as: ADVIL   meloxicam 15 MG tablet Commonly known as: MOBIC     TAKE these medications   amLODipine 10 MG tablet Commonly known as: NORVASC Take 10 mg by mouth every morning.   folic acid 1 MG tablet Commonly known as: FOLVITE Take 1 tablet (1 mg total) by mouth daily. Notes to patient: Last given today at 9:28  Am Next dose tomorrow morning   guaiFENesin-dextromethorphan 100-10 MG/5ML syrup Commonly known as: ROBITUSSIN DM Take 10 mLs by mouth every 4 (four) hours as needed for cough.   Ipratropium-Albuterol 20-100 MCG/ACT Aers respimat Commonly known as: COMBIVENT Inhale 1 puff into the lungs every 6 (six) hours.   mupirocin ointment 2 % Commonly known as: BACTROBAN Apply 1 application topically 2 (two) times daily.   nadolol 20 MG tablet Commonly known as: CORGARD Take 1 tablet (20 mg total) by mouth daily. What changed: how much to take   pantoprazole 40 MG tablet Commonly known as: Protonix Take 1 tablet (40 mg total) by mouth 2 (two) times  daily. Notes to patient: Last given today at 9:27  Am Next dose tomorrow morning   pravastatin 20 MG tablet Commonly known as: PRAVACHOL Take 20 mg by mouth at bedtime.   Symbicort 160-4.5 MCG/ACT inhaler Generic drug: budesonide-formoterol Inhale 2 puffs into the lungs in the morning, at noon, and at bedtime.   traMADol 50 MG tablet Commonly known as: ULTRAM Take 50 mg by mouth every 6 (six) hours as needed for moderate pain.       Disposition and follow-up:   Mr.Chad Turner was discharged from [facility] in Good condition.  At the hospital follow up visit please address:  1.  Follow-up: 1) GI bleed, 2) Cirrhosis esophageal varices   2.  Labs / imaging needed at time of follow-up: liver function, cbc, cmp  3.  Pending labs/ test needing follow-up: none  Follow-up Appointments: Follow-up Information    Levin Erp, PA Follow up on 01/30/2020.   Specialty: Gastroenterology Why: 9 AM, follow up with PA regarding liver disease and ulcers.   Contact information: [place] [id number] [phone]        Sonia Side., FNP Follow up.   Specialty: Family Medicine Why: Your clinic will call you to schedule a f/u appointment for you next week. Call if you have not heard by then.  Contact information: [place] [id number] [place] [facility] Course by problem list:  59 y.o. yo male w/ PMH significant for hepatic cirrhosis without varices, prostate cancer, hiatal hernia, Covid 19 infection in December 2020, DJD of lumbar and thoracic spine, Cocaine use disorder, alcohol use disorder.  1.  GI bleed secondary to gastric ulcers - presented to [facility] with exertional dyspnea over the past few weeks.  Patient did have multiple episodes of presyncope at home.  He also admitted to multiple episodes of dark stools over the preceding 3 days.  Patient does admit to taking substantially more  over-the-counter NSAIDs over the last 2 weeks for back pain.  Patient's hemoglobin on arrival was 4.6 and he was transfused 3+ units of PRBC.  Patient was started on PPI and GI was consulted .  EGD was performed showing large nonbleeding gastric ulcer.  Patient was continued on IV PPI 48 hours and switch to p.o.  CT scan of abdomen pelvis showed no obvious source of bleeding intra-abdominal cavity.  Patient's hemoglobin stabilized at 7.9 and his symptoms resolved.  Patient was discharged and restarted back on his home dose of nadolol for portal hypertension/gastropathy.  Patient was discharged to follow-up with GI in the outpatient setting for repeat surveillance of esophageal varices.  2. Cirrhosis - patient presented with cirrhosis secondary to EtOH use disorder.  EGD showed evidence of small nonbleeding varices.  US abdomen and CT scan abdomen pelvis showed cirrhosis with no evidence of ascites.  Patient was restarted on nadolol for portal hypertension at discharge.   Discharge Vitals:   BP 130/78 (BP Location: Right Arm)   Pulse 79   Temp 98.6 F (37 C) (Oral)   Resp 18   Ht 5' 10"  (1.778 m)   Wt 105.7 kg   SpO2 99%   BMI 33.43 kg/m   Pertinent Labs, Studies, and Procedures:  CBC Latest Ref Rng & Units 01/09/2020 01/08/2020 01/07/2020  WBC 4.0 - 10.5 K/uL 10.5 13.2(H) -  Hemoglobin 13.0 - 17.0 g/dL 7.9(L) 7.6(L) 6.9(LL)  Hematocrit 39.0 - 52.0 % 24.6(L) 23.1(L) 21.4(L)  Platelets 150 - 400 K/uL 249 206 -   CMP Latest Ref Rng & Units 01/09/2020 01/08/2020 01/07/2020  Glucose 70 - 99 mg/dL 109(H) 101(H) 128(H)  BUN 6 - 20 mg/dL <5(L) 11 24(H)  Creatinine 0.61 - 1.24 mg/dL 0.78 0.73 0.84  Sodium 135 - 145 mmol/L 132(L) 134(L) 130(L)  Potassium 3.5 - 5.1 mmol/L 3.2(L) 3.2(L) 3.5  Chloride 98 - 111 mmol/L 105 104 98  CO2 22 - 32 mmol/L 21(L) 23 21(L)  Calcium 8.9 - 10.3 mg/dL 8.4(L) 8.2(L) 8.2(L)  Total Protein 6.5 - 8.1 g/dL 5.8(L) 5.5(L) 5.4(L)  Total Bilirubin 0.3 - 1.2 mg/dL 0.5 1.1 0.7   Alkaline Phos 38 - 126 U/L 53 51 47  AST 15 - 41 U/L 23 18 18   ALT 0 - 44 U/L 18 15 17    CTA A/P: 1. Evaluation for active GI bleeding is limited by the presence of hyperdense material within portions of the colon. Given this limitation, no active GI bleeding was detected.  2. Colonic diverticulosis without CT evidence for diverticulitis.  3. Cirrhosis.  4. There is cholelithiasis without secondary signs of acute cholecystitis. Aortic Atherosclerosis   US Abdomen: 1. Nodular contour of the liver, in keeping with history of cirrhosis. No focal liver lesion identified.  2.  No significant ascites.  3. Cholelithiasis.  EGD: Grade I, small (< 5 mm) varices were found in the distal esophagus - there are no stigmata, these are new from recent EGD. A  small hiatal hernia was found. The proximal extent of the gastric folds (end of tubular esophagus) was 45 cm from the incisors. The hiatal narrowing was 47 cm from the incisors. The Z-line was 45 cm from the incisors. One large non-bleeding cratered gastric ulcer with a clean ulcer base (Forrest Class III) and pigment that washed away was found on the greater curvature of the stomach. The lesion was 25 mm in largest dimension. Many non-obstructing non-bleeding cratered, linear, and superficial gastric ulcers of moderate severity with a clean ulcer base (Forrest Class III) were found in the gastric body, at the incisura and in the gastric antrum. There is no evidence of perforation. These are all new from recent EGD. Patchy mildly erythematous mucosa without bleeding was found in the gastric body and in the gastric antrum. Biopsies were taken with a cold forceps for histology and Helicobacter pylori testing. Mild mucosal changes characterized by granularity, nodularity and altered texture were found in the duodenal bulb. Biopsies were taken with a cold forceps for histology. No gross lesions were noted in the duodenal bulb, in the first portion  of the duodenum and in the second portion of the duodenum.  Discharge Instructions: Discharge Instructions    Diet - low sodium heart healthy   Complete by: As directed    Discharge instructions   Complete by: As directed    Mr. Tosh,   It was a pleasure taking care of you here in the hospital.  You were admitted because of gastrointestinal bleed.  You underwent endoscopy and we found that he had several ulcers which were due to the fact that you are taking ibuprofen/Aleve/Motrin.  Going forward, you will have to abstain from these medications as well as Goody powders as it can make the bleeding worse.  You will take Protonix 40 mg twice a day.  For your cirrhosis and esophageal varices, you will take Nadolol 20 mg once a day.  Please follow-up with your primary doctor and the gastroenterologist.  Take care!   Increase activity slowly   Complete by: As directed       Signed: Marianna Payment, MD 01/12/2020, 4:25 PM   Pager: [phone]

## 2020-01-14 ENCOUNTER — Other Ambulatory Visit (HOSPITAL_COMMUNITY): Payer: Medicare Other

## 2020-01-15 ENCOUNTER — Telehealth: Payer: Self-pay | Admitting: Gastroenterology

## 2020-01-15 NOTE — Telephone Encounter (Signed)
Pt allergic to PCN

## 2020-01-15 NOTE — Telephone Encounter (Signed)
[facility] needs a prior [place]

## 2020-01-16 MED ORDER — TETRACYCLINE HCL 500 MG PO CAPS: 500.0000 mg | ORAL_CAPSULE | Freq: Three times a day (TID) | ORAL | 0 refills | Status: DC

## 2020-01-16 MED ORDER — METRONIDAZOLE 250 MG PO TABS: 250.0000 mg | ORAL_TABLET | Freq: Four times a day (QID) | ORAL | 0 refills | Status: DC

## 2020-01-16 MED ORDER — BISMUTH SUBSALICYLATE 262 MG PO TABS: 2.0000 | ORAL_TABLET | Freq: Four times a day (QID) | ORAL | 0 refills | Status: AC

## 2020-01-16 NOTE — Telephone Encounter (Signed)
Ok. Quadruple therapy: Bismuth-subsalicylate 594 mg QID Metronidazole 250 mg QID Tetracycline 500 mg TID PPI BID  This will be for a 14-day treatment.  If any other issues arise please let his primary GI decide on medical therapy - Dr. Hilarie Fredrickson.  Thanks. GM

## 2020-01-16 NOTE — Telephone Encounter (Signed)
The medications have been sent as ordered by Dr Rush Landmark

## 2020-01-16 NOTE — Telephone Encounter (Signed)
Dr Rush Landmark the pt is allergic to PCN and insurance will not pay for [facility].  What would you like to send for h pylori?

## 2020-01-29 ENCOUNTER — Other Ambulatory Visit: Payer: Self-pay

## 2020-01-29 ENCOUNTER — Ambulatory Visit (HOSPITAL_COMMUNITY): Payer: Medicare Other | Attending: Cardiovascular Disease

## 2020-01-29 DIAGNOSIS — I10 Essential (primary) hypertension: Secondary | ICD-10-CM | POA: Insufficient documentation

## 2020-01-29 DIAGNOSIS — I517 Cardiomegaly: Secondary | ICD-10-CM | POA: Diagnosis not present

## 2020-01-30 ENCOUNTER — Other Ambulatory Visit (INDEPENDENT_AMBULATORY_CARE_PROVIDER_SITE_OTHER): Payer: Medicare Other

## 2020-01-30 ENCOUNTER — Other Ambulatory Visit: Payer: Self-pay

## 2020-01-30 ENCOUNTER — Ambulatory Visit (INDEPENDENT_AMBULATORY_CARE_PROVIDER_SITE_OTHER): Payer: Medicare Other | Admitting: Physician Assistant

## 2020-01-30 ENCOUNTER — Encounter: Payer: Self-pay | Admitting: Physician Assistant

## 2020-01-30 VITALS — BP 142/82 | HR 112 | Temp 97.2°F | Ht 69.0 in | Wt 223.2 lb

## 2020-01-30 DIAGNOSIS — Z8719 Personal history of other diseases of the digestive system: Secondary | ICD-10-CM

## 2020-01-30 DIAGNOSIS — Z8711 Personal history of peptic ulcer disease: Secondary | ICD-10-CM

## 2020-01-30 DIAGNOSIS — Z8619 Personal history of other infectious and parasitic diseases: Secondary | ICD-10-CM

## 2020-01-30 DIAGNOSIS — K7460 Unspecified cirrhosis of liver: Secondary | ICD-10-CM

## 2020-01-30 DIAGNOSIS — K7030 Alcoholic cirrhosis of liver without ascites: Secondary | ICD-10-CM | POA: Diagnosis not present

## 2020-01-30 LAB — CBC WITH DIFFERENTIAL/PLATELET
Basophils Absolute: 0.1 10*3/uL (ref 0.0–0.1)
Basophils Relative: 1.1 % (ref 0.0–3.0)
Eosinophils Absolute: 0.1 10*3/uL (ref 0.0–0.7)
Eosinophils Relative: 1.3 % (ref 0.0–5.0)
HCT: 33.8 % — ABNORMAL LOW (ref 39.0–52.0)
Hemoglobin: 10.7 g/dL — ABNORMAL LOW (ref 13.0–17.0)
Lymphocytes Relative: 15.8 % (ref 12.0–46.0)
Lymphs Abs: 1 10*3/uL (ref 0.7–4.0)
MCHC: 31.6 g/dL (ref 30.0–36.0)
MCV: 77.6 fl — ABNORMAL LOW (ref 78.0–100.0)
Monocytes Absolute: 1 10*3/uL (ref 0.1–1.0)
Monocytes Relative: 15.7 % — ABNORMAL HIGH (ref 3.0–12.0)
Neutro Abs: 4.3 10*3/uL (ref 1.4–7.7)
Neutrophils Relative %: 66.1 % (ref 43.0–77.0)
Platelets: 157 10*3/uL (ref 150.0–400.0)
RBC: 4.36 Mil/uL (ref 4.22–5.81)
RDW: 22 % — ABNORMAL HIGH (ref 11.5–15.5)
WBC: 6.5 10*3/uL (ref 4.0–10.5)

## 2020-01-30 LAB — COMPREHENSIVE METABOLIC PANEL
ALT: 20 U/L (ref 0–53)
AST: 28 U/L (ref 0–37)
Albumin: 3.7 g/dL (ref 3.5–5.2)
Alkaline Phosphatase: 96 U/L (ref 39–117)
BUN: 4 mg/dL — ABNORMAL LOW (ref 6–23)
CO2: 27 mEq/L (ref 19–32)
Calcium: 8.8 mg/dL (ref 8.4–10.5)
Chloride: 97 mEq/L (ref 96–112)
Creatinine, Ser: 0.76 mg/dL (ref 0.40–1.50)
GFR: 126.24 mL/min (ref >60.00)
Glucose, Bld: 107 mg/dL — ABNORMAL HIGH (ref 70–99)
Potassium: 3.3 mEq/L — ABNORMAL LOW (ref 3.5–5.1)
Sodium: 131 mEq/L — ABNORMAL LOW (ref 135–145)
Total Bilirubin: 0.7 mg/dL (ref 0.2–1.2)
Total Protein: 7.2 g/dL (ref 6.0–8.3)

## 2020-01-30 LAB — IBC + FERRITIN
Ferritin: 37.1 ng/mL (ref 22.0–322.0)
Iron: 20 ug/dL — ABNORMAL LOW (ref 42–165)
Saturation Ratios: 5.1 % — ABNORMAL LOW (ref 20.0–50.0)
Transferrin: 279 mg/dL (ref 212.0–360.0)

## 2020-01-30 MED ORDER — POTASSIUM CHLORIDE ER 20 MEQ PO TBCR: 20.0000 meq | EXTENDED_RELEASE_TABLET | Freq: Every day | ORAL | 0 refills | Status: DC
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
## 2020-01-30 NOTE — Patient Instructions (Addendum)
If you are age 60 or older, your body mass index should be between 23-30. Your Body mass index is 32.97 kg/m. If this is out of the aforementioned range listed, please consider follow up with your Primary Care Provider.  If you are age 64 or younger, your body mass index should be between 19-25. Your Body mass index is 32.97 kg/m. If this is out of the aformentioned range listed, please consider follow up with your Primary Care Provider.   Your provider has requested that you go to the basement level for lab work before leaving today. Press "B" on the elevator. The lab is located at the first door on the left as you exit the elevator.  Continue Pantoprazole 40 mg twice daily.

## 2020-02-05 NOTE — Progress Notes (Signed)
Addendum: Reviewed and agree with assessment and management plan. Orell Hurtado M, MD  

## 2020-03-31 ENCOUNTER — Encounter: Payer: Self-pay | Admitting: Internal Medicine

## 2020-04-08 ENCOUNTER — Other Ambulatory Visit: Payer: Self-pay | Admitting: Orthopaedic Surgery

## 2020-05-13 ENCOUNTER — Encounter (HOSPITAL_COMMUNITY): Payer: Self-pay

## 2020-05-13 NOTE — Progress Notes (Signed)
COVID Vaccine Completed: Date COVID Vaccine completed: COVID vaccine manufacturer: [place]   PCP - Dustin Folks, [place] Cardiologist -   Chest x-ray - 01-07-20 EKG - 01-15-20 Stress Test - N/A ECHO - 01-29-20 Cardiac Cath - N/A  Sleep Study - N/A CPAP - N/A  Fasting Blood Sugar -  Checks Blood Sugar _____ times a day  Blood Thinner Instructions: Aspirin Instructions: Last Dose:  Anesthesia review:   Patient denies shortness of breath, fever, cough and chest pain at PAT appointment   Patient verbalized understanding of instructions that were given to them at the PAT appointment. Patient was also instructed that they will need to review over the PAT instructions again at home before surgery.

## 2020-05-13 NOTE — Patient Instructions (Addendum)
DUE TO COVID-19 ONLY ONE VISITOR ARE ALLOWED TO COME WITH YOU AND STAY IN THE WAITING ROOM ONLY DURING PRE OP AND  PROCEDURE. THEN TWO VISITORS MAY VISIT WITH YOU IN YOUR PRIVATE ROOM DURING VISITING HOURS ONLY!!   COVID SWAB TESTING MUST BE COMPLETED ON:     05-15-20 @ 3pm [address] [place] -[facility] enter pre surgical testing line (Must self quarantine after testing. Follow instructions on handout.)             Your procedure is scheduled on: 05-19-20   Report to [facility] Main  Entrance   Report to Short Stay at 5:30 AM   [facility])    Call this number if you have problems the morning of surgery [phone]   Do not eat food :After Midnight.   May have liquids until 4:30 AM day of surgery   Complete one G2 drink the morning of surgery at  4:30 AM the day of surgery.  CLEAR LIQUID DIET  Foods Allowed                                                                     Foods Excluded  Water, Black Coffee and tea, regular and decaf              liquids that you cannot  Plain Jell-O in any flavor  (No red)                                     see through such as: Fruit ices (not with fruit pulp)                                      milk, soups, orange juice  Iced Popsicles (No red)                                     All solid food                                   Apple juices Sports drinks like Gatorade (No red) Lightly seasoned clear broth or consume(fat free) Sugar, honey syrup     Oral Hygiene is also important to reduce your risk of infection.                                     Remember - BRUSH YOUR TEETH THE MORNING OF SURGERY WITH YOUR REGULAR TOOTHPASTE   Do NOT smoke after Midnight   Take these medicines the morning of surgery with A SIP OF WATER: Amlodipine (Norvasc), Nadolol (Corgard), Pantoprazole (Protonix), Tramadol  (Ultram)                               You may not have any metal on your body  including and body  piercings              Do not wear lotions, powders, cologne, or deodorant              Men may shave face and neck.    Do not bring valuables to the hospital. [place].   Contacts, dentures or bridgework may not be worn into surgery.   Bring small overnight bag day of surgery.    Patients discharged the day of surgery will not be allowed to drive home.   Special Instructions: Bring a copy of your healthcare power of attorney and living will documents the day of surgery if you haven't scanned them in  before.              Please read over the following fact sheets you were given: IF YOU HAVE QUESTIONS ABOUT YOUR PRE OP INSTRUCTIONS PLEASE CALL  [phone]    [place] - Preparing for Surgery Before surgery, you can play an important role.  Because skin is not sterile, your skin needs to be as free of germs as possible.  You can reduce the number of germs on your skin by washing with CHG (chlorahexidine gluconate) soap before surgery.  CHG is an antiseptic cleaner which kills germs and bonds with the skin to continue killing germs even after washing. Please DO NOT use if you have an allergy to CHG or antibacterial soaps.  If your skin becomes reddened/irritated stop using the CHG and inform your nurse when you arrive at Short Stay. Do not shave (including legs and underarms) for at least 48 hours prior to the first CHG shower.  You may shave your face/neck.  Please follow these instructions carefully:  1.  Shower with CHG Soap the night before surgery and the  morning of surgery.  2.  If you choose to wash your hair, wash your hair first as usual with your normal  shampoo.  3.  After you shampoo, rinse your hair and body thoroughly to remove the shampoo.                             4.  Use CHG as you would any other liquid soap.  You can apply chg directly to the skin and wash.  Gently with a scrungie or clean washcloth.  5.  Apply the CHG Soap  to your body ONLY FROM THE NECK DOWN.   Do   not use on face/ open                           Wound or open sores. Avoid contact with eyes, ears mouth and   genitals (private parts).                       Wash face,  Genitals (private parts) with your normal soap.             6.  Wash thoroughly, paying special attention to the area where your    surgery  will be performed.  7.  Thoroughly rinse your body with warm water from the neck down.  8.  DO NOT shower/wash with your normal soap after using and rinsing off the CHG Soap.                9.  Pat yourself  dry with a clean towel.            10.  Wear clean pajamas.            11.  Place clean sheets on your bed the night of your first shower and do not  sleep with pets. Day of Surgery : Do not apply any lotions/deodorants the morning of surgery.  Please wear clean clothes to the hospital/surgery center.  FAILURE TO FOLLOW THESE INSTRUCTIONS MAY RESULT IN THE CANCELLATION OF YOUR SURGERY  PATIENT SIGNATURE_________________________________  NURSE SIGNATURE__________________________________  ________________________________________________________________________   Chad Turner  An incentive spirometer is a tool that can help keep your lungs clear and active. This tool measures how well you are filling your lungs with each breath. Taking long deep breaths may help reverse or decrease the chance of developing breathing (pulmonary) problems (especially infection) following:  A long period of time when you are unable to move or be active. BEFORE THE PROCEDURE   If the spirometer includes an indicator to show your best effort, your nurse or respiratory therapist will set it to a desired goal.  If possible, sit up straight or lean slightly forward. Try not to slouch.  Hold the incentive spirometer in an upright position. INSTRUCTIONS FOR USE  1. Sit on the edge of your bed if possible, or sit up as far as you can in bed or on a  chair. 2. Hold the incentive spirometer in an upright position. 3. Breathe out normally. 4. Place the mouthpiece in your mouth and seal your lips tightly around it. 5. Breathe in slowly and as deeply as possible, raising the piston or the ball toward the top of the column. 6. Hold your breath for 3-5 seconds or for as long as possible. Allow the piston or ball to fall to the bottom of the column. 7. Remove the mouthpiece from your mouth and breathe out normally. 8. Rest for a few seconds and repeat Steps 1 through 7 at least 10 times every 1-2 hours when you are awake. Take your time and take a few normal breaths between deep breaths. 9. The spirometer may include an indicator to show your best effort. Use the indicator as a goal to work toward during each repetition. 10. After each set of 10 deep breaths, practice coughing to be sure your lungs are clear. If you have an incision (the cut made at the time of surgery), support your incision when coughing by placing a pillow or rolled up towels firmly against it. Once you are able to get out of bed, walk around indoors and cough well. You may stop using the incentive spirometer when instructed by your caregiver.  RISKS AND COMPLICATIONS  Take your time so you do not get dizzy or light-headed.  If you are in pain, you may need to take or ask for pain medication before doing incentive spirometry. It is harder to take a deep breath if you are having pain. AFTER USE  Rest and breathe slowly and easily.  It can be helpful to keep track of a log of your progress. Your caregiver can provide you with a simple table to help with this. If you are using the spirometer at home, follow these instructions: [place] IF:   You are having difficultly using the spirometer.  You have trouble using the spirometer as often as instructed.  Your pain medication is not giving enough relief while using the spirometer.  You develop fever of 100.5  F  (38.1 C) or higher. SEEK IMMEDIATE MEDICAL CARE IF:   You cough up bloody sputum that had not been present before.  You develop fever of 102 F (38.9 C) or greater.  You develop worsening pain at or near the incision site. MAKE SURE YOU:   Understand these instructions.  Will watch your condition.  Will get help right away if you are not doing well or get worse. Document Released: 02/27/2007 Document Revised: 01/09/2012 Document Reviewed: 04/30/2007 ExitCare Patient Information 2014 ExitCare, [place].   ________________________________________________________________________  WHAT IS A BLOOD TRANSFUSION? Blood Transfusion Information  A transfusion is the replacement of blood or some of its parts. Blood is made up of multiple cells which provide different functions.  Red blood cells carry oxygen and are used for blood loss replacement.  White blood cells fight against infection.  Platelets control bleeding.  Plasma helps clot blood.  Other blood products are available for specialized needs, such as hemophilia or other clotting disorders. BEFORE THE TRANSFUSION  Who gives blood for transfusions?   Healthy volunteers who are fully evaluated to make sure their blood is safe. This is blood bank blood. Transfusion therapy is the safest it has ever been in the practice of medicine. Before blood is taken from a donor, a complete history is taken to make sure that person has no history of diseases nor engages in risky social behavior (examples are intravenous drug use or sexual activity with multiple partners). The donor's travel history is screened to minimize risk of transmitting infections, such as malaria. The donated blood is tested for signs of infectious diseases, such as HIV and hepatitis. The blood is then tested to be sure it is compatible with you in order to minimize the chance of a transfusion reaction. If you or a relative donates blood, this is often done in anticipation  of surgery and is not appropriate for emergency situations. It takes many days to process the donated blood. RISKS AND COMPLICATIONS Although transfusion therapy is very safe and saves many lives, the main dangers of transfusion include:   Getting an infectious disease.  Developing a transfusion reaction. This is an allergic reaction to something in the blood you were given. Every precaution is taken to prevent this. The decision to have a blood transfusion has been considered carefully by your caregiver before blood is given. Blood is not given unless the benefits outweigh the risks. AFTER THE TRANSFUSION  Right after receiving a blood transfusion, you will usually feel much better and more energetic. This is especially true if your red blood cells have gotten low (anemic). The transfusion raises the level of the red blood cells which carry oxygen, and this usually causes an energy increase.  The nurse administering the transfusion will monitor you carefully for complications. HOME CARE INSTRUCTIONS  No special instructions are needed after a transfusion. You may find your energy is better. Speak with your caregiver about any limitations on activity for underlying diseases you may have. SEEK MEDICAL CARE IF:   Your condition is not improving after your transfusion.  You develop redness or irritation at the intravenous (IV) site. SEEK IMMEDIATE MEDICAL CARE IF:  Any of the following symptoms occur over the next 12 hours:  Shaking chills.  You have a temperature by mouth above 102 F (38.9 C), not controlled by medicine.  Chest, back, or muscle pain.  People around you feel you are not acting correctly or are confused.  Shortness of breath or difficulty breathing.  Dizziness and fainting.  You get a rash or develop hives.  You have a decrease in urine output.  Your urine turns a dark color or changes to pink, red, or brown. Any of the following symptoms occur over the next 10  days:  You have a temperature by mouth above 102 F (38.9 C), not controlled by medicine.  Shortness of breath.  Weakness after normal activity.  The white part of the eye turns yellow (jaundice).  You have a decrease in the amount of urine or are urinating less often.  Your urine turns a dark color or changes to pink, red, or brown. Document Released: 10/14/2000 Document Revised: 01/09/2012 Document Reviewed: 06/02/2008 ExitCare Patient Information 2014 [place].  _______________________________________________________________________ DUE TO COVID-19 ONLY ONE VISITOR ARE ALLOWED TO COME WITH YOU AND STAY IN THE WAITING ROOM ONLY DURING PRE OP AND PROCEDURE. THEN TWO VISITORS MAY VISIT WITH YOU IN YOUR PRIVATE ROOM DURING VISITING HOURS ONLY!!   COVID SWAB TESTING MUST BE COMPLETED ON:      [address], [place] -[facility] enter pre surgical testing line (Must self quarantine after testing. Follow instructions on handout.)        [place] [facility] [place] (Must self quarantine after testing. Follow instructions on handout.)       Your procedure is scheduled on:    Report to [facility] Main  Entrance   Report to Short Stay at 5:30 AM   [facility])    Report to admitting at AM   Call this number if you have problems the morning of surgery [phone]   Do not eat food or drink liquids :After Midnight.   May have liquids until    day of surgery   Complete one Ensure drink the morning of surgery at       the day of surgery.   Drink 2 Ensure drinks the night before surgery.  Complete one Ensure drink the morning of surgery 3 hours prior to scheduled surgery.   Oral Hygiene is also important to reduce your risk of infection.                                    Remember - BRUSH YOUR TEETH THE MORNING OF SURGERY WITH YOUR REGULAR TOOTHPASTE   Do NOT smoke after Midnight   Take  these medicines the morning of surgery with A SIP OF WATER:   DO NOT TAKE ANY ORAL DIABETIC MEDICATIONS DAY OF YOUR SURGERY                               You may not have any metal on your body including hair pins, jewelry, and body piercings             Do not wear make-up, lotions, powders, perfumes/cologne, or deodorant             Do not wear nail polish.  Do not shave  48 hours prior to surgery.              Men may shave face and neck.   Do not bring valuables to the hospital. [place].   Contacts, dentures or bridgework  may not be worn into surgery.   Bring small overnight bag day of surgery.    Patients discharged the day of surgery will not be allowed to drive home.   Special Instructions: Bring a copy of your healthcare power of attorney and living will documents         the day of surgery if you haven't scanned them in before.              Please read over the following fact sheets you were given: IF [place] [phone]

## 2020-05-14 ENCOUNTER — Encounter (HOSPITAL_COMMUNITY): Payer: Self-pay

## 2020-05-14 ENCOUNTER — Other Ambulatory Visit: Payer: Self-pay

## 2020-05-14 ENCOUNTER — Encounter (HOSPITAL_COMMUNITY)
Admission: RE | Admit: 2020-05-14 | Discharge: 2020-05-14 | Disposition: A | Discharge status: Home / Self Care | Payer: Medicare Other | Source: Ambulatory Visit | Attending: Orthopaedic Surgery | Admitting: Orthopaedic Surgery

## 2020-05-14 DIAGNOSIS — Z01812 Encounter for preprocedural laboratory examination: Secondary | ICD-10-CM | POA: Insufficient documentation

## 2020-05-14 HISTORY — DX: Helicobacter pylori (H. pylori) as the cause of diseases classified elsewhere: B96.81

## 2020-05-14 HISTORY — DX: Anemia, unspecified: D64.9

## 2020-05-14 HISTORY — DX: Gastro-esophageal reflux disease without esophagitis: K21.9

## 2020-05-14 HISTORY — DX: Helicobacter pylori (H. pylori) as the cause of diseases classified elsewhere: K27.9

## 2020-05-14 HISTORY — DX: Esophageal varices without bleeding: I85.00

## 2020-05-14 HISTORY — DX: Gastrointestinal hemorrhage, unspecified: K92.2

## 2020-05-14 NOTE — Progress Notes (Signed)
COVID Vaccine Completed: Yes Date COVID Vaccine completed: Unsure asked to bring card COVID vaccine manufacturer: [place]   PCP - Dustin Folks, [place] Cardiologist - N/A  Chest x-ray - 01-07-20 EKG - 01-15-20 Stress Test - N/A ECHO - 01-29-20 Cardiac Cath - N/A  Sleep Study - N/A CPAP - N/A  Fasting Blood Sugar - N/A Checks Blood Sugar ___N/A__ times a day  Blood Thinner Instructions: N/A Aspirin Instructions:N/A Last Dose: N/A  Anesthesia review: CIRRHOSIS Patient denies shortness of breath, fever, cough and chest pain at PAT appointment   Patient verbalized understanding of instructions that were given to them at the PAT appointment. Patient was also instructed that they will need to review over the PAT instructions again at home before surgery.

## 2020-05-15 ENCOUNTER — Encounter (HOSPITAL_COMMUNITY)
Admission: RE | Admit: 2020-05-15 | Discharge: 2020-05-15 | Disposition: A | Discharge status: Home / Self Care | Payer: Medicare Other | Source: Ambulatory Visit | Attending: Orthopaedic Surgery | Admitting: Orthopaedic Surgery

## 2020-05-15 ENCOUNTER — Inpatient Hospital Stay (HOSPITAL_COMMUNITY): Admission: RE | Admit: 2020-05-15 | Payer: Medicare Other | Source: Ambulatory Visit

## 2020-05-15 DIAGNOSIS — Z01812 Encounter for preprocedural laboratory examination: Secondary | ICD-10-CM | POA: Diagnosis not present

## 2020-05-15 LAB — CBC WITH DIFFERENTIAL/PLATELET
Abs Immature Granulocytes: 0.04 10*3/uL (ref 0.00–0.07)
Basophils Absolute: 0.1 10*3/uL (ref 0.0–0.1)
Basophils Relative: 1 %
Eosinophils Absolute: 0.3 10*3/uL (ref 0.0–0.5)
Eosinophils Relative: 3 %
HCT: 37.1 % — ABNORMAL LOW (ref 39.0–52.0)
Hemoglobin: 12.3 g/dL — ABNORMAL LOW (ref 13.0–17.0)
Immature Granulocytes: 1 %
Lymphocytes Relative: 26 %
Lymphs Abs: 2.3 10*3/uL (ref 0.7–4.0)
MCH: 28 pg (ref 26.0–34.0)
MCHC: 33.2 g/dL (ref 30.0–36.0)
MCV: 84.3 fL (ref 80.0–100.0)
Monocytes Absolute: 1 10*3/uL (ref 0.1–1.0)
Monocytes Relative: 11 %
Neutro Abs: 5 10*3/uL (ref 1.7–7.7)
Neutrophils Relative %: 58 %
Platelets: 296 10*3/uL (ref 150–400)
RBC: 4.4 MIL/uL (ref 4.22–5.81)
RDW: 16.5 % — ABNORMAL HIGH (ref 11.5–15.5)
WBC: 8.6 10*3/uL (ref 4.0–10.5)
nRBC: 0 % (ref 0.0–0.2)

## 2020-05-15 LAB — APTT: aPTT: 35 seconds (ref 24–36)

## 2020-05-15 LAB — SURGICAL PCR SCREEN
MRSA, PCR: NEGATIVE
Staphylococcus aureus: NEGATIVE

## 2020-05-15 LAB — BASIC METABOLIC PANEL
Anion gap: 15 (ref 5–15)
BUN: <5 mg/dL — ABNORMAL LOW (ref 8–23)
CO2: 24 mmol/L (ref 22–32)
Calcium: 9.2 mg/dL (ref 8.9–10.3)
Chloride: 91 mmol/L — ABNORMAL LOW (ref 98–111)
Creatinine, Ser: 0.62 mg/dL (ref 0.61–1.24)
GFR calc Af Amer: >60 mL/min (ref >60)
GFR calc non Af Amer: >60 mL/min (ref >60)
Glucose, Bld: 100 mg/dL — ABNORMAL HIGH (ref 70–99)
Potassium: 2.8 mmol/L — ABNORMAL LOW (ref 3.5–5.1)
Sodium: 130 mmol/L — ABNORMAL LOW (ref 135–145)

## 2020-05-15 LAB — PROTIME-INR
INR: 1.2 (ref 0.8–1.2)
Prothrombin Time: 14.3 seconds (ref 11.4–15.2)

## 2020-05-15 NOTE — Progress Notes (Addendum)
Patient arrived late for scheduled 3:00 PM Covid Appt on 05-15-20.  Pt atient rescheduled for 05-16-20 at 11:10 AM,per Konrad Felix, PA.Marland Kitchen Pt also unable to void at PST appt. Denies urinary symptoms. Okay to obtain UA on day of surgery, per Konrad Felix, PA  Addendum: Also per Konrad Felix, PA repeat BMP day of surgery. Order placed.
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
## 2020-05-16 ENCOUNTER — Other Ambulatory Visit (HOSPITAL_COMMUNITY)
Admission: RE | Admit: 2020-05-16 | Discharge: 2020-05-16 | Disposition: A | Discharge status: Home / Self Care | Payer: Medicare Other | Source: Ambulatory Visit | Attending: Orthopaedic Surgery | Admitting: Orthopaedic Surgery

## 2020-05-16 DIAGNOSIS — Z01818 Encounter for other preprocedural examination: Secondary | ICD-10-CM | POA: Diagnosis present

## 2020-05-16 DIAGNOSIS — Z20822 Contact with and (suspected) exposure to covid-19: Secondary | ICD-10-CM | POA: Diagnosis not present

## 2020-05-16 LAB — SARS CORONAVIRUS 2 (TAT 6-24 HRS): SARS Coronavirus 2: NEGATIVE

## 2020-05-18 MED ORDER — TRANEXAMIC ACID 1000 MG/10ML IV SOLN
2000.0000 mg | INTRAVENOUS | Status: DC
  Filled 2020-05-18: qty 20

## 2020-05-18 MED ORDER — BUPIVACAINE LIPOSOME 1.3 % IJ SUSP
20.0000 mL | Freq: Once | INTRAMUSCULAR | Status: DC
  Filled 2020-05-18 (×2): qty 20

## 2020-05-18 NOTE — Anesthesia Preprocedure Evaluation (Addendum)
Anesthesia Evaluation  Patient identified by MRN, date of birth, ID band Patient awake    Reviewed: Allergy & Precautions, NPO status , Patient's Chart, lab work & pertinent test results, reviewed documented beta blocker date and time   Airway Mallampati: II  TM Distance: >3 FB Neck ROM: Full    Dental no notable dental hx. (+) Poor Dentition, Dental Advisory Given,    Pulmonary  COVID 7-3moago   Pulmonary exam normal breath sounds clear to auscultation       Cardiovascular hypertension, Pt. on medications and Pt. on home beta blockers +CHF (grade 2 diastolic dysfunction)  Normal cardiovascular exam Rhythm:Regular Rate:Normal  Echo 12/2019: 1. Left ventricular ejection fraction, by estimation, is 60 to 65%. The  left ventricle has normal function. The left ventricle has no regional  wall motion abnormalities. Left ventricular diastolic parameters are  consistent with Grade II diastolic  dysfunction (pseudonormalization). Elevated left atrial pressure.  2. Right ventricular systolic function is normal. The right ventricular  size is normal. Tricuspid regurgitation signal is inadequate for assessing  PA pressure.  3. Left atrial size was mildly dilated.  4. The mitral valve is myxomatous. No evidence of mitral valve  regurgitation. No evidence of mitral stenosis.  5. The aortic valve is normal in structure. Aortic valve regurgitation is  not visualized. No aortic stenosis is present.  6. The inferior vena cava is normal in size with greater than 50%  respiratory variability, suggesting right atrial pressure of 3 mmHg.   Neuro/Psych PSYCHIATRIC DISORDERS Depression negative neurological ROS     GI/Hepatic PUD, GERD  Medicated and Controlled,(+) Cirrhosis   Esophageal Varices  substance abuse (12 pack of beer per day, current marijuana )  alcohol use, cocaine use and marijuana use, Hepatitis -Has never needed to be  tapped Denies current cocaine use    Endo/Other  negative endocrine ROS  Renal/GU negative Renal ROS     Musculoskeletal  (+) Arthritis , Osteoarthritis,    Abdominal   Peds  Hematology  (+) Blood dyscrasia, anemia , plt 296, hct 37.1   Anesthesia Other Findings   Reproductive/Obstetrics negative OB ROS                          Anesthesia Physical Anesthesia Plan  ASA: IV  Anesthesia Plan: Spinal, Regional and MAC   Post-op Pain Management:  Regional for Post-op pain   Induction:   PONV Risk Score and Plan: 2 and Propofol infusion and TIVA  Airway Management Planned: Natural Airway and Nasal Cannula  Additional Equipment: None  Intra-op Plan:   Post-operative Plan:   Informed Consent: I have reviewed the patients History and Physical, chart, labs and discussed the procedure including the risks, benefits and alternatives for the proposed anesthesia with the patient or authorized representative who has indicated his/her understanding and acceptance.     Dental advisory given  Plan Discussed with: CRNA  Anesthesia Plan Comments: (K 2.6 from 2.8 at preop appt, will replete and proceed)      Anesthesia Quick Evaluation

## 2020-05-19 ENCOUNTER — Other Ambulatory Visit: Payer: Self-pay

## 2020-05-19 ENCOUNTER — Observation Stay (HOSPITAL_COMMUNITY)
Admission: RE | Admit: 2020-05-19 | Discharge: 2020-05-20 | Disposition: A | Discharge status: Home / Self Care | Payer: Medicare Other | Attending: Orthopaedic Surgery | Admitting: Orthopaedic Surgery

## 2020-05-19 ENCOUNTER — Ambulatory Visit (HOSPITAL_COMMUNITY): Payer: Medicare Other | Admitting: Physician Assistant

## 2020-05-19 ENCOUNTER — Encounter (HOSPITAL_COMMUNITY): Payer: Self-pay | Admitting: Orthopaedic Surgery

## 2020-05-19 ENCOUNTER — Encounter (HOSPITAL_COMMUNITY)
Admission: RE | Disposition: A | Discharge status: Home / Self Care | Payer: Self-pay | Source: Home / Self Care | Attending: Orthopaedic Surgery

## 2020-05-19 ENCOUNTER — Ambulatory Visit (HOSPITAL_COMMUNITY): Payer: Medicare Other | Admitting: Anesthesiology

## 2020-05-19 DIAGNOSIS — Z8711 Personal history of peptic ulcer disease: Secondary | ICD-10-CM | POA: Insufficient documentation

## 2020-05-19 DIAGNOSIS — K7030 Alcoholic cirrhosis of liver without ascites: Secondary | ICD-10-CM | POA: Insufficient documentation

## 2020-05-19 DIAGNOSIS — E785 Hyperlipidemia, unspecified: Secondary | ICD-10-CM | POA: Insufficient documentation

## 2020-05-19 DIAGNOSIS — K219 Gastro-esophageal reflux disease without esophagitis: Secondary | ICD-10-CM | POA: Insufficient documentation

## 2020-05-19 DIAGNOSIS — M1711 Unilateral primary osteoarthritis, right knee: Principal | ICD-10-CM | POA: Insufficient documentation

## 2020-05-19 DIAGNOSIS — Z79899 Other long term (current) drug therapy: Secondary | ICD-10-CM | POA: Insufficient documentation

## 2020-05-19 DIAGNOSIS — I10 Essential (primary) hypertension: Secondary | ICD-10-CM | POA: Diagnosis not present

## 2020-05-19 DIAGNOSIS — F1010 Alcohol abuse, uncomplicated: Secondary | ICD-10-CM | POA: Insufficient documentation

## 2020-05-19 DIAGNOSIS — Z01818 Encounter for other preprocedural examination: Secondary | ICD-10-CM

## 2020-05-19 HISTORY — PX: TOTAL KNEE ARTHROPLASTY: SHX125

## 2020-05-19 LAB — BASIC METABOLIC PANEL
Anion gap: 10 (ref 5–15)
BUN: <5 mg/dL — ABNORMAL LOW (ref 8–23)
CO2: 25 mmol/L (ref 22–32)
Calcium: 9 mg/dL (ref 8.9–10.3)
Chloride: 96 mmol/L — ABNORMAL LOW (ref 98–111)
Creatinine, Ser: 0.72 mg/dL (ref 0.61–1.24)
GFR calc Af Amer: >60 mL/min (ref >60)
GFR calc non Af Amer: >60 mL/min (ref >60)
Glucose, Bld: 116 mg/dL — ABNORMAL HIGH (ref 70–99)
Potassium: 2.6 mmol/L — CL (ref 3.5–5.1)
Sodium: 131 mmol/L — ABNORMAL LOW (ref 135–145)

## 2020-05-19 LAB — TYPE AND SCREEN
ABO/RH(D): O POS
Antibody Screen: NEGATIVE

## 2020-05-19 SURGERY — ARTHROPLASTY, KNEE, TOTAL
Anesthesia: Monitor Anesthesia Care | Site: Knee | Laterality: Right

## 2020-05-19 MED ORDER — TRANEXAMIC ACID-NACL 1000-0.7 MG/100ML-% IV SOLN
INTRAVENOUS | Status: AC
  Filled 2020-05-19: qty 100

## 2020-05-19 MED ORDER — CHLORHEXIDINE GLUCONATE 0.12 % MT SOLN
15.0000 mL | Freq: Once | OROMUCOSAL | Status: AC
  Administered 2020-05-19: 15 mL via OROMUCOSAL

## 2020-05-19 MED ORDER — 0.9 % SODIUM CHLORIDE (POUR BTL) OPTIME
TOPICAL | Status: DC | PRN
  Administered 2020-05-19: 1000 mL

## 2020-05-19 MED ORDER — MORPHINE SULFATE (PF) 2 MG/ML IV SOLN: 0.5000 mg | INTRAVENOUS | Status: DC | PRN

## 2020-05-19 MED ORDER — ROPIVACAINE HCL 5 MG/ML IJ SOLN
INTRAMUSCULAR | Status: DC | PRN
Start: 2020-05-19 — End: 2020-05-19
  Administered 2020-05-19: 30 mL via PERINEURAL

## 2020-05-19 MED ORDER — ORAL CARE MOUTH RINSE: 15.0000 mL | Freq: Once | OROMUCOSAL | Status: AC

## 2020-05-19 MED ORDER — PANTOPRAZOLE SODIUM 40 MG PO TBEC
40.0000 mg | DELAYED_RELEASE_TABLET | Freq: Two times a day (BID) | ORAL | Status: DC
  Administered 2020-05-19 – 2020-05-20 (×2): 40 mg via ORAL
  Filled 2020-05-19 (×2): qty 1

## 2020-05-19 MED ORDER — LACTATED RINGERS IV SOLN: INTRAVENOUS | Status: DC

## 2020-05-19 MED ORDER — VANCOMYCIN HCL IN DEXTROSE 1-5 GM/200ML-% IV SOLN
INTRAVENOUS | Status: AC
  Filled 2020-05-19: qty 200

## 2020-05-19 MED ORDER — METHOCARBAMOL 500 MG PO TABS
500.0000 mg | ORAL_TABLET | Freq: Four times a day (QID) | ORAL | Status: DC | PRN
  Administered 2020-05-20: 500 mg via ORAL
  Filled 2020-05-19: qty 1

## 2020-05-19 MED ORDER — METOCLOPRAMIDE HCL 5 MG PO TABS: 5.0000 mg | ORAL_TABLET | Freq: Three times a day (TID) | ORAL | Status: DC | PRN

## 2020-05-19 MED ORDER — PROPOFOL 500 MG/50ML IV EMUL
INTRAVENOUS | Status: AC
  Filled 2020-05-19: qty 50

## 2020-05-19 MED ORDER — NADOLOL 20 MG PO TABS
20.0000 mg | ORAL_TABLET | Freq: Every day | ORAL | Status: DC
  Administered 2020-05-20: 20 mg via ORAL
  Filled 2020-05-19: qty 1

## 2020-05-19 MED ORDER — HYDROCODONE-ACETAMINOPHEN 7.5-325 MG PO TABS
1.0000 | ORAL_TABLET | ORAL | Status: DC | PRN
  Administered 2020-05-19 (×2): 2 via ORAL
  Filled 2020-05-19 (×2): qty 2

## 2020-05-19 MED ORDER — PROPOFOL 10 MG/ML IV BOLUS
INTRAVENOUS | Status: AC
  Filled 2020-05-19: qty 20

## 2020-05-19 MED ORDER — PROPOFOL 1000 MG/100ML IV EMUL
INTRAVENOUS | Status: AC
  Filled 2020-05-19: qty 100

## 2020-05-19 MED ORDER — ONDANSETRON HCL 4 MG/2ML IJ SOLN: 4.0000 mg | Freq: Four times a day (QID) | INTRAMUSCULAR | Status: DC | PRN

## 2020-05-19 MED ORDER — HYDROCHLOROTHIAZIDE 25 MG PO TABS
12.5000 mg | ORAL_TABLET | Freq: Every day | ORAL | Status: DC
  Administered 2020-05-20: 12.5 mg via ORAL
  Filled 2020-05-19: qty 1

## 2020-05-19 MED ORDER — METHOCARBAMOL 500 MG IVPB - SIMPLE MED
500.0000 mg | Freq: Four times a day (QID) | INTRAVENOUS | Status: DC | PRN
  Administered 2020-05-19: 500 mg via INTRAVENOUS
  Filled 2020-05-19: qty 50

## 2020-05-19 MED ORDER — METOCLOPRAMIDE HCL 5 MG/ML IJ SOLN: 5.0000 mg | Freq: Three times a day (TID) | INTRAMUSCULAR | Status: DC | PRN

## 2020-05-19 MED ORDER — MIDAZOLAM HCL 5 MG/5ML IJ SOLN
INTRAMUSCULAR | Status: DC | PRN
  Administered 2020-05-19 (×2): 1 mg via INTRAVENOUS

## 2020-05-19 MED ORDER — PRAVASTATIN SODIUM 20 MG PO TABS: 20.0000 mg | ORAL_TABLET | Freq: Every day | ORAL | Status: DC

## 2020-05-19 MED ORDER — LIDOCAINE 2% (20 MG/ML) 5 ML SYRINGE
INTRAMUSCULAR | Status: AC
  Filled 2020-05-19: qty 5

## 2020-05-19 MED ORDER — VANCOMYCIN HCL IN DEXTROSE 1-5 GM/200ML-% IV SOLN
1000.0000 mg | INTRAVENOUS | Status: AC
  Administered 2020-05-19: 1000 mg via INTRAVENOUS

## 2020-05-19 MED ORDER — OXYCODONE HCL 5 MG PO TABS: 5.0000 mg | ORAL_TABLET | Freq: Once | ORAL | Status: DC | PRN

## 2020-05-19 MED ORDER — MIDAZOLAM HCL 2 MG/2ML IJ SOLN
INTRAMUSCULAR | Status: AC
  Filled 2020-05-19: qty 2

## 2020-05-19 MED ORDER — BUPIVACAINE LIPOSOME 1.3 % IJ SUSP
INTRAMUSCULAR | Status: DC | PRN
  Administered 2020-05-19: 20 mL

## 2020-05-19 MED ORDER — DEXAMETHASONE SODIUM PHOSPHATE 10 MG/ML IJ SOLN
INTRAMUSCULAR | Status: DC | PRN
  Administered 2020-05-19: 12 mg

## 2020-05-19 MED ORDER — PROPOFOL 500 MG/50ML IV EMUL
INTRAVENOUS | Status: DC | PRN
  Administered 2020-05-19: 100 ug/kg/min via INTRAVENOUS

## 2020-05-19 MED ORDER — ZOLPIDEM TARTRATE 10 MG PO TABS
10.0000 mg | ORAL_TABLET | Freq: Every evening | ORAL | Status: DC | PRN
  Administered 2020-05-19: 10 mg via ORAL
  Filled 2020-05-19: qty 1

## 2020-05-19 MED ORDER — HYDROMORPHONE HCL 1 MG/ML IJ SOLN: 0.2500 mg | INTRAMUSCULAR | Status: DC | PRN

## 2020-05-19 MED ORDER — KETOROLAC TROMETHAMINE 15 MG/ML IJ SOLN
15.0000 mg | Freq: Four times a day (QID) | INTRAMUSCULAR | Status: AC
  Administered 2020-05-19 (×2): 15 mg via INTRAVENOUS
  Filled 2020-05-19 (×2): qty 1

## 2020-05-19 MED ORDER — OXYCODONE HCL 5 MG/5ML PO SOLN: 5.0000 mg | Freq: Once | ORAL | Status: DC | PRN

## 2020-05-19 MED ORDER — PHENYLEPHRINE 40 MCG/ML (10ML) SYRINGE FOR IV PUSH (FOR BLOOD PRESSURE SUPPORT)
PREFILLED_SYRINGE | INTRAVENOUS | Status: AC
  Filled 2020-05-19: qty 10

## 2020-05-19 MED ORDER — TRANEXAMIC ACID 1000 MG/10ML IV SOLN
INTRAVENOUS | Status: DC | PRN
  Administered 2020-05-19: 2000 mg via TOPICAL

## 2020-05-19 MED ORDER — ALUM & MAG HYDROXIDE-SIMETH 200-200-20 MG/5ML PO SUSP: 30.0000 mL | ORAL | Status: DC | PRN

## 2020-05-19 MED ORDER — SODIUM CHLORIDE (PF) 0.9 % IJ SOLN
INTRAMUSCULAR | Status: DC | PRN
  Administered 2020-05-19: 30 mL

## 2020-05-19 MED ORDER — HYDROCODONE-ACETAMINOPHEN 5-325 MG PO TABS
1.0000 | ORAL_TABLET | ORAL | Status: DC | PRN
  Administered 2020-05-20: 2 via ORAL
  Filled 2020-05-19: qty 2

## 2020-05-19 MED ORDER — BUPIVACAINE IN DEXTROSE 0.75-8.25 % IT SOLN
INTRATHECAL | Status: DC | PRN
  Administered 2020-05-19: 2 mL via INTRATHECAL

## 2020-05-19 MED ORDER — TRANEXAMIC ACID-NACL 1000-0.7 MG/100ML-% IV SOLN
1000.0000 mg | INTRAVENOUS | Status: AC
  Administered 2020-05-19: 1000 mg via INTRAVENOUS

## 2020-05-19 MED ORDER — POVIDONE-IODINE 10 % EX SWAB
2.0000 "application " | Freq: Once | CUTANEOUS | Status: AC
  Administered 2020-05-19: 2 via TOPICAL

## 2020-05-19 MED ORDER — BISACODYL 5 MG PO TBEC: 5.0000 mg | DELAYED_RELEASE_TABLET | Freq: Every day | ORAL | Status: DC | PRN

## 2020-05-19 MED ORDER — MENTHOL 3 MG MT LOZG: 1.0000 | LOZENGE | OROMUCOSAL | Status: DC | PRN

## 2020-05-19 MED ORDER — ASPIRIN 81 MG PO CHEW
81.0000 mg | CHEWABLE_TABLET | Freq: Two times a day (BID) | ORAL | Status: DC
  Administered 2020-05-20: 81 mg via ORAL
  Filled 2020-05-19: qty 1

## 2020-05-19 MED ORDER — TRANEXAMIC ACID-NACL 1000-0.7 MG/100ML-% IV SOLN: 1000.0000 mg | Freq: Once | INTRAVENOUS | Status: AC

## 2020-05-19 MED ORDER — PHENOL 1.4 % MT LIQD: 1.0000 | OROMUCOSAL | Status: DC | PRN

## 2020-05-19 MED ORDER — ACETAMINOPHEN 500 MG PO TABS
500.0000 mg | ORAL_TABLET | Freq: Four times a day (QID) | ORAL | Status: AC
  Administered 2020-05-19 – 2020-05-20 (×2): 500 mg via ORAL
  Filled 2020-05-19 (×2): qty 1

## 2020-05-19 MED ORDER — FENTANYL CITRATE (PF) 100 MCG/2ML IJ SOLN
INTRAMUSCULAR | Status: DC | PRN
  Administered 2020-05-19 (×2): 25 ug via INTRAVENOUS
  Administered 2020-05-19: 50 ug via INTRAVENOUS

## 2020-05-19 MED ORDER — DEXAMETHASONE SODIUM PHOSPHATE 10 MG/ML IJ SOLN
INTRAMUSCULAR | Status: AC
  Filled 2020-05-19: qty 1

## 2020-05-19 MED ORDER — FENTANYL CITRATE (PF) 100 MCG/2ML IJ SOLN
INTRAMUSCULAR | Status: AC
  Filled 2020-05-19: qty 2

## 2020-05-19 MED ORDER — PHENYLEPHRINE 40 MCG/ML (10ML) SYRINGE FOR IV PUSH (FOR BLOOD PRESSURE SUPPORT)
PREFILLED_SYRINGE | INTRAVENOUS | Status: DC | PRN
  Administered 2020-05-19: 150 ug via INTRAVENOUS
  Administered 2020-05-19: 100 ug via INTRAVENOUS

## 2020-05-19 MED ORDER — BUPIVACAINE-EPINEPHRINE 0.5% -1:200000 IJ SOLN
INTRAMUSCULAR | Status: DC | PRN
  Administered 2020-05-19: 30 mL

## 2020-05-19 MED ORDER — LIDOCAINE 2% (20 MG/ML) 5 ML SYRINGE
INTRAMUSCULAR | Status: DC | PRN
  Administered 2020-05-19: 30 mg via INTRAVENOUS

## 2020-05-19 MED ORDER — METHOCARBAMOL 500 MG IVPB - SIMPLE MED
INTRAVENOUS | Status: AC
  Filled 2020-05-19: qty 50

## 2020-05-19 MED ORDER — ONDANSETRON HCL 4 MG/2ML IJ SOLN
INTRAMUSCULAR | Status: DC | PRN
  Administered 2020-05-19: 4 mg via INTRAVENOUS

## 2020-05-19 MED ORDER — ONDANSETRON HCL 4 MG/2ML IJ SOLN
INTRAMUSCULAR | Status: AC
  Filled 2020-05-19: qty 2

## 2020-05-19 MED ORDER — PROPOFOL 10 MG/ML IV BOLUS
INTRAVENOUS | Status: DC | PRN
  Administered 2020-05-19: 30 mg via INTRAVENOUS

## 2020-05-19 MED ORDER — POTASSIUM CHLORIDE 10 MEQ/100ML IV SOLN
10.0000 meq | INTRAVENOUS | Status: AC
  Administered 2020-05-19 (×3): 10 meq via INTRAVENOUS
  Filled 2020-05-19 (×3): qty 100

## 2020-05-19 MED ORDER — SODIUM CHLORIDE 0.9% IV SOLUTION
INTRAVENOUS | Status: AC | PRN
  Administered 2020-05-19: 3000 mL

## 2020-05-19 MED ORDER — HYDROCODONE-ACETAMINOPHEN 5-325 MG PO TABS
ORAL_TABLET | ORAL | Status: AC
  Administered 2020-05-19: 2 via ORAL
  Filled 2020-05-19: qty 2

## 2020-05-19 MED ORDER — ONDANSETRON HCL 4 MG/2ML IJ SOLN: 4.0000 mg | Freq: Once | INTRAMUSCULAR | Status: DC | PRN

## 2020-05-19 MED ORDER — TRANEXAMIC ACID-NACL 1000-0.7 MG/100ML-% IV SOLN
INTRAVENOUS | Status: AC
  Administered 2020-05-19: 1000 mg via INTRAVENOUS
  Filled 2020-05-19: qty 100

## 2020-05-19 MED ORDER — ACETAMINOPHEN 325 MG PO TABS: 325.0000 mg | ORAL_TABLET | Freq: Four times a day (QID) | ORAL | Status: DC | PRN

## 2020-05-19 MED ORDER — ONDANSETRON HCL 4 MG PO TABS: 4.0000 mg | ORAL_TABLET | Freq: Four times a day (QID) | ORAL | Status: DC | PRN

## 2020-05-19 MED ORDER — BUPIVACAINE-EPINEPHRINE 0.5% -1:200000 IJ SOLN
INTRAMUSCULAR | Status: AC
  Filled 2020-05-19: qty 1

## 2020-05-19 MED ORDER — AMLODIPINE BESYLATE 10 MG PO TABS
10.0000 mg | ORAL_TABLET | Freq: Every morning | ORAL | Status: DC
  Administered 2020-05-20: 10 mg via ORAL
  Filled 2020-05-19: qty 1

## 2020-05-19 MED ORDER — DOCUSATE SODIUM 100 MG PO CAPS
100.0000 mg | ORAL_CAPSULE | Freq: Two times a day (BID) | ORAL | Status: DC
  Administered 2020-05-19 – 2020-05-20 (×2): 100 mg via ORAL
  Filled 2020-05-19 (×2): qty 1

## 2020-05-19 MED ORDER — DIPHENHYDRAMINE HCL 12.5 MG/5ML PO ELIX: 12.5000 mg | ORAL_SOLUTION | ORAL | Status: DC | PRN

## 2020-05-19 MED ORDER — SODIUM CHLORIDE (PF) 0.9 % IJ SOLN
INTRAMUSCULAR | Status: AC
  Filled 2020-05-19: qty 50

## 2020-05-19 MED ORDER — VANCOMYCIN HCL IN DEXTROSE 1-5 GM/200ML-% IV SOLN
1000.0000 mg | Freq: Two times a day (BID) | INTRAVENOUS | Status: AC
  Administered 2020-05-19: 1000 mg via INTRAVENOUS
  Filled 2020-05-19: qty 200

## 2020-05-19 SURGICAL SUPPLY — 49 items
ATTUNE MED DOME PAT 38 KNEE (Knees) ×2 IMPLANT
ATTUNE PS FEM RT SZ 6 CEM KNEE (Femur) ×2 IMPLANT
ATTUNE PSRP INSR SZ6 12 KNEE (Insert) ×2 IMPLANT
BAG DECANTER FOR FLEXI CONT (MISCELLANEOUS) ×2 IMPLANT
BAG ZIPLOCK 12X15 (MISCELLANEOUS) ×2 IMPLANT
BASE TIBIAL ROT PLAT SZ 7 KNEE (Knees) ×1 IMPLANT
BLADE SAGITTAL 25.0X1.19X90 (BLADE) ×2 IMPLANT
BLADE SAW SGTL 11.0X1.19X90.0M (BLADE) ×2 IMPLANT
BNDG ELASTIC 6X5.8 VLCR STR LF (GAUZE/BANDAGES/DRESSINGS) ×2 IMPLANT
BOOTIES KNEE HIGH SLOAN (MISCELLANEOUS) ×2 IMPLANT
BOWL SMART MIX CTS (DISPOSABLE) ×2 IMPLANT
CEMENT HV SMART SET (Cement) ×4 IMPLANT
COVER SURGICAL LIGHT HANDLE (MISCELLANEOUS) ×2 IMPLANT
COVER WAND RF STERILE (DRAPES) ×2 IMPLANT
CUFF TOURN SGL QUICK 34 (TOURNIQUET CUFF) ×2
CUFF TRNQT CYL 34X4.125X (TOURNIQUET CUFF) ×1 IMPLANT
DECANTER SPIKE VIAL GLASS SM (MISCELLANEOUS) ×4 IMPLANT
DRAPE SHEET LG 3/4 BI-LAMINATE (DRAPES) ×2 IMPLANT
DRAPE TOP 10253 STERILE (DRAPES) ×2 IMPLANT
DRAPE U-SHAPE 47X51 STRL (DRAPES) ×2 IMPLANT
DRSG AQUACEL AG ADV 3.5X10 (GAUZE/BANDAGES/DRESSINGS) ×2 IMPLANT
DURAPREP 26ML APPLICATOR (WOUND CARE) ×4 IMPLANT
ELECT REM PT RETURN 15FT ADLT (MISCELLANEOUS) ×2 IMPLANT
GLOVE BIO SURGEON STRL SZ8 (GLOVE) ×4 IMPLANT
GLOVE BIOGEL PI IND STRL 8 (GLOVE) ×2 IMPLANT
GLOVE BIOGEL PI INDICATOR 8 (GLOVE) ×2
GOWN STRL REUS W/TWL XL LVL3 (GOWN DISPOSABLE) ×4 IMPLANT
HANDPIECE INTERPULSE COAX TIP (DISPOSABLE) ×2
HOLDER FOLEY CATH W/STRAP (MISCELLANEOUS) ×2 IMPLANT
HOOD PEEL AWAY FLYTE STAYCOOL (MISCELLANEOUS) ×6 IMPLANT
KIT TURNOVER KIT A (KITS) IMPLANT
MANIFOLD NEPTUNE II (INSTRUMENTS) ×2 IMPLANT
NS IRRIG 1000ML POUR BTL (IV SOLUTION) ×2 IMPLANT
PACK TOTAL KNEE CUSTOM (KITS) ×2 IMPLANT
PAD ARMBOARD 7.5X6 YLW CONV (MISCELLANEOUS) ×2 IMPLANT
PENCIL SMOKE EVACUATOR (MISCELLANEOUS) IMPLANT
PIN DRILL FIX HALF THREAD (BIT) ×2 IMPLANT
PIN STEINMAN FIXATION KNEE (PIN) ×2 IMPLANT
PROTECTOR NERVE ULNAR (MISCELLANEOUS) ×2 IMPLANT
SET HNDPC FAN SPRY TIP SCT (DISPOSABLE) ×1 IMPLANT
SUT ETHIBOND NAB CT1 #1 30IN (SUTURE) ×4 IMPLANT
SUT VIC AB 0 CT1 36 (SUTURE) ×2 IMPLANT
SUT VIC AB 2-0 CT1 27 (SUTURE) ×2
SUT VIC AB 2-0 CT1 TAPERPNT 27 (SUTURE) ×1 IMPLANT
SUT VICRYL AB 3-0 FS1 BRD 27IN (SUTURE) ×2 IMPLANT
TIBIAL BASE ROT PLAT SZ 7 KNEE (Knees) ×2 IMPLANT
TRAY FOLEY MTR SLVR 16FR STAT (SET/KITS/TRAYS/PACK) IMPLANT
WATER STERILE IRR 1000ML POUR (IV SOLUTION) ×4 IMPLANT
WRAP KNEE MAXI GEL POST OP (GAUZE/BANDAGES/DRESSINGS) ×2 IMPLANT

## 2020-05-19 NOTE — Anesthesia Postprocedure Evaluation (Signed)
Anesthesia Post Note  Patient: Chad Turner.  Procedure(s) Performed: RIGHT TOTAL KNEE ARTHROPLASTY (Right Knee)     Patient location during evaluation: PACU Anesthesia Type: Regional, MAC and Spinal Level of consciousness: oriented and awake and alert Pain management: pain level controlled Vital Signs Assessment: post-procedure vital signs reviewed and stable Respiratory status: spontaneous breathing, respiratory function stable and patient connected to nasal cannula oxygen Cardiovascular status: blood pressure returned to baseline and stable Postop Assessment: no headache, no backache, no apparent nausea or vomiting, spinal receding and patient able to bend at knees Anesthetic complications: no   No complications documented.  Last Vitals:  Vitals:   05/19/20 1005 05/19/20 1015  BP: 109/71 114/71  Pulse: 72 69  Resp: (!) 23 (!) 23  Temp:    SpO2: 99% 91%    Last Pain:  Vitals:   05/19/20 1015  TempSrc:   PainSc: 0-No pain                 Pervis Hocking

## 2020-05-19 NOTE — Anesthesia Procedure Notes (Signed)
Spinal  Patient location during procedure: OR Start time: 05/19/2020 7:33 AM End time: 05/19/2020 7:37 AM Reason for block: at surgeon's request Staffing Performed: resident/CRNA  Resident/CRNA: Anne Fu, CRNA Preanesthetic Checklist Completed: patient identified, IV checked, site marked, risks and benefits discussed, surgical consent, monitors and equipment checked, pre-op evaluation and timeout performed Spinal Block Patient position: sitting Prep: DuraPrep Patient monitoring: heart rate, continuous pulse ox and blood pressure Approach: midline Location: L2-3 Injection technique: single-shot Needle Needle type: Pencan  Needle gauge: 24 G Needle length: 9 cm Assessment Sensory level: T6 Additional Notes  Functioning IV was confirmed and monitors were applied. Expiration date of kit checked and confirmed. Sterile prep and drape, including hand hygiene and sterile gloves were used. The patient was positioned and the spine was prepped. The skin was anesthetized with lidocaine.  Free flow of clear CSF was obtained prior to injecting local anesthetic into the CSF X 1 attempt.  The spinal needle aspirated freely following injection.  The needle was carefully withdrawn. Patient tolerated procedure well, without complications. Loss of motor and sensory on exam post injection.

## 2020-05-19 NOTE — Op Note (Signed)
PREOP DIAGNOSIS: DJD RIGHT KNEE POSTOP DIAGNOSIS: same PROCEDURE: RIGHT TKR ANESTHESIA: Spinal and MAC ATTENDING SURGEON: [name] ASSISTANT: Loni Dolly PA  INDICATIONS FOR PROCEDURE: ISterling D Demarcus Turner. is a 59 y.o. male who has struggled for a long time with pain due to degenerative arthritis of the right knee.  The patient has failed many conservative non-operative measures and at this point has pain which limits the ability to sleep and walk.  The patient is offered total knee replacement.  Informed operative consent was obtained after discussion of possible risks of anesthesia, infection, neurovascular injury, DVT, and death.  The importance of the post-operative rehabilitation protocol to optimize result was stressed extensively with the patient.  SUMMARY OF FINDINGS AND PROCEDURE:  Reinhard Schack. was taken to the operative suite where under the above anesthesia a right knee replacement was performed.  There were advanced degenerative changes and the bone quality was excellent.  We used the DePuy Attune system and placed size 6 femur, 7 tibia, 38 mm all polyethylene patella, and a size 12 mm spacer.  Loni Dolly PA-C assisted throughout and was invaluable to the completion of the case in that he helped retract and maintain exposure while I placed components.  He also helped close thereby minimizing OR time.  The patient was admitted for appropriate post-op care to include perioperative antibiotics and mechanical and pharmacologic measures for DVT prophylaxis.  DESCRIPTION OF PROCEDURE:  Trevin Gartrell. was taken to the operative suite where the above anesthesia was applied.  The patient was positioned supine and prepped and draped in normal sterile fashion.  An appropriate time out was performed.  After the administration of vancomycin pre-op antibiotic the leg was elevated and exsanguinated and a tourniquet inflated. A standard longitudinal incision was made on the anterior  knee.  Dissection was carried down to the extensor mechanism.  All appropriate anti-infective measures were used including the pre-operative antibiotic, betadine impregnated drape, and closed hooded exhaust systems for each member of the surgical team.  A medial parapatellar incision was made in the extensor mechanism and the knee cap flipped and the knee flexed.  Some residual meniscal tissues were removed along with any remaining ACL/PCL tissue.  A guide was placed on the tibia and a flat cut was made on it's superior surface.  An intramedullary guide was placed in the femur and was utilized to make anterior and posterior cuts creating an appropriate flexion gap.  A second intramedullary guide was placed in the femur to make a distal cut properly balancing the knee with an extension gap equal to the flexion gap.  The three bones sized to the above mentioned sizes and the appropriate guides were placed and utilized.  A trial reduction was done and the knee easily came to full extension and the patella tracked well on flexion.  The trial components were removed and all bones were cleaned with pulsatile lavage and then dried thoroughly.  Cement was mixed and was pressurized onto the bones followed by placement of the aforementioned components.  Excess cement was trimmed and pressure was held on the components until the cement had hardened.  The tourniquet was deflated and a small amount of bleeding was controlled with cautery and pressure.  The knee was irrigated thoroughly.  The extensor mechanism was re-approximated with #1 ethibond in interrupted fashion.  The knee was flexed and the repair was solid.  The subcutaneous tissues were re-approximated with #0 and #2-0 vicryl and the  skin closed with a subcuticular stitch and steristrips.  A sterile dressing was applied.  Intraoperative fluids, EBL, and tourniquet time can be obtained from anesthesia records.  DISPOSITION:  The patient was taken to recovery room in  stable condition and admitted for appropriate post-op care to include peri-operative antibiotic and DVT prophylaxis with mechanical and pharmacologic measures.  Monico Blitz Kassia Demarinis 05/19/2020, 9:05 AM

## 2020-05-19 NOTE — Transfer of Care (Signed)
Immediate Anesthesia Transfer of Care Note  Patient: Chad Turner.  Procedure(s) Performed: Procedure(s): RIGHT TOTAL KNEE ARTHROPLASTY (Right)  Patient Location: PACU  Anesthesia Type:Spinal  Level of Consciousness:  sedated, patient cooperative and responds to stimulation  Airway & Oxygen Therapy:Patient Spontanous Breathing and Patient connected to face mask oxgen  Post-op Assessment:  Report given to PACU RN and Post -op Vital signs reviewed and stable  Post vital signs:  Reviewed and stable  Last Vitals:  Vitals:   05/19/20 0559  BP: 126/70  Pulse: 88  Resp: 16  Temp: 36.9 C  SpO2: 87%    Complications: No apparent anesthesia complications

## 2020-05-19 NOTE — Interval H&P Note (Signed)
History and Physical Interval Note:  05/19/2020 7:26 AM  Chad Turner.  has presented today for surgery, with the diagnosis of RIGHT KNEE DEGENERATIVE JOINT DISEASE.  The various methods of treatment have been discussed with the patient and family. After consideration of risks, benefits and other options for treatment, the patient has consented to  Procedure(s): RIGHT TOTAL KNEE ARTHROPLASTY (Right) as a surgical intervention.  The patient's history has been reviewed, patient examined, no change in status, stable for surgery.  I have reviewed the patient's chart and labs.  Questions were answered to the patient's satisfaction.     [name]

## 2020-05-19 NOTE — Addendum Note (Signed)
Addendum  created 05/19/20 1326 by Anne Fu, CRNA   Charge Capture section accepted

## 2020-05-19 NOTE — Evaluation (Signed)
Physical Therapy Evaluation Patient Details Name: Chad Turner. MRN: [id number] DOB: 1961/03/07 Today's Date: 05/19/2020   History of Present Illness  59 yo male s/p R TKA 05/19/20. Hx of ETOH abuse, COVID 2020  Clinical Impression  On eval POD 0, pt required Min assist for mobility. He walked ~55 feet with a RW. Pain rated 7/10. Pt was a little shaky. Will continue to follow and progress activity as tolerated. Plan is for d/c home tomorrow if all continues to go well and cleared by surgeon/PA.     Follow Up Recommendations Follow surgeon's recommendation for DC plan and follow-up therapies    Equipment Recommendations  Rolling walker with 5" wheels;3in1 (PT)    Recommendations for Other Services       Precautions / Restrictions Precautions Precautions: Fall Restrictions Weight Bearing Restrictions: No Other Position/Activity Restrictions: WBAT      Mobility  Bed Mobility Overal bed mobility: Needs Assistance Bed Mobility: Supine to Sit     Supine to sit: Supervision;HOB elevated        Transfers Overall transfer level: Needs assistance Equipment used: Rolling walker (2 wheeled) Transfers: Sit to/from Stand Sit to Stand: Min assist;From elevated surface         General transfer comment: Assist to power up, stabilize, control descent. VCs safety, technique, hand/LE placement  Ambulation/Gait Ambulation/Gait assistance: Min assist Gait Distance (Feet): 55 Feet Assistive device: Rolling walker (2 wheeled) Gait Pattern/deviations: Step-to pattern     General Gait Details: VCs safety, technique, sequence. Assist to steady throughout distance. Pt was a little shaky. He denied lightheadedness.  Stairs            Wheelchair Mobility    Modified Rankin (Stroke Patients Only)       Balance Overall balance assessment: Needs assistance         Standing balance support: Bilateral upper extremity supported Standing balance-Leahy Scale: Fair                                Pertinent Vitals/Pain Pain Assessment: 0-10 Pain Score: 7  Pain Location: R knee Pain Descriptors / Indicators: Sore;Discomfort Pain Intervention(s): Monitored during session;Limited activity within patient's tolerance;Repositioned;Ice applied    Home Living Family/patient expects to be discharged to:: Private residence Living Arrangements: Alone Available Help at Discharge: Friend(s) Type of Home: Apartment Home Access: Stairs to enter Entrance Stairs-Rails: None Entrance Stairs-Number of Steps: 1 Home Layout: One level Home Equipment: Grab bars - tub/shower;Hand held shower head      Prior Function Level of Independence: Independent               Hand Dominance        Extremity/Trunk Assessment   Upper Extremity Assessment Upper Extremity Assessment: Overall WFL for tasks assessed    Lower Extremity Assessment Lower Extremity Assessment: Generalized weakness    Cervical / Trunk Assessment Cervical / Trunk Assessment: Normal  Communication   Communication: No difficulties  Cognition Arousal/Alertness: Awake/alert Behavior During Therapy: WFL for tasks assessed/performed Overall Cognitive Status: Within Functional Limits for tasks assessed                                        General Comments      Exercises     Assessment/Plan    PT Assessment Patient needs continued PT services  PT  Problem List Decreased strength;Decreased range of motion;Decreased mobility;Decreased activity tolerance;Decreased balance;Decreased knowledge of use of DME;Pain;Decreased knowledge of precautions       PT Treatment Interventions DME instruction;Gait training;Therapeutic activities;Therapeutic exercise;Patient/family education;Stair training;Balance training;Functional mobility training    PT Goals (Current goals can be found in the Care Plan section)  Acute Rehab PT Goals Patient Stated Goal: regain  independence PT Goal Formulation: With patient Time For Goal Achievement: 06/02/20 Potential to Achieve Goals: Good    Frequency 7X/week   Barriers to discharge        Co-evaluation               AM-PAC PT "6 Clicks" Mobility  Outcome Measure Help needed turning from your back to your side while in a flat bed without using bedrails?: A Little Help needed moving from lying on your back to sitting on the side of a flat bed without using bedrails?: A Little Help needed moving to and from a bed to a chair (including a wheelchair)?: A Little Help needed standing up from a chair using your arms (e.g., wheelchair or bedside chair)?: A Little Help needed to walk in hospital room?: A Little Help needed climbing 3-5 steps with a railing? : A Little 6 Click Score: 18    End of Session Equipment Utilized During Treatment: Gait belt Activity Tolerance: Patient tolerated treatment well Patient left: in bed;with call bell/phone within reach;with bed alarm set   PT Visit Diagnosis: Pain;Other abnormalities of gait and mobility (R26.89) Pain - Right/Left: Right Pain - part of body: Knee    Time: 1594-5859 PT Time Calculation (min) (ACUTE ONLY): 22 min   Charges:   PT Evaluation $PT Eval Low Complexity: [place], PT Acute Rehabilitation  Office: [phone] Pager: [phone]

## 2020-05-19 NOTE — Anesthesia Procedure Notes (Signed)
Anesthesia Regional Block: Adductor canal block   Pre-Anesthetic Checklist: ,, timeout performed, Correct Patient, Correct Site, Correct Laterality, Correct Procedure, Correct Position, site marked, Risks and benefits discussed,  Surgical consent,  Pre-op evaluation,  At surgeon's request and post-op pain management  Laterality: Right  Prep: Maximum Sterile Barrier Precautions used, chloraprep       Needles:  Injection technique: Single-shot  Needle Type: Echogenic Stimulator Needle     Needle Length: 9cm  Needle Gauge: 22     Additional Needles:   Procedures:,,,, ultrasound used (permanent image in chart),,,,  Narrative:  Start time: 05/19/2020 6:30 AM End time: 05/19/2020 6:37 AM Injection made incrementally with aspirations every 5 mL.  Performed by: Personally  Anesthesiologist: Pervis Hocking, DO  Additional Notes: Monitors applied. No increased pain on injection. No increased resistance to injection. Injection made in 5cc increments. Good needle visualization. Patient tolerated procedure well.

## 2020-05-20 ENCOUNTER — Encounter (HOSPITAL_COMMUNITY): Payer: Self-pay | Admitting: Orthopaedic Surgery

## 2020-05-20 DIAGNOSIS — M1711 Unilateral primary osteoarthritis, right knee: Secondary | ICD-10-CM | POA: Diagnosis not present

## 2020-05-20 MED ORDER — TIZANIDINE HCL 4 MG PO TABS
4.0000 mg | ORAL_TABLET | Freq: Four times a day (QID) | ORAL | 1 refills | Status: DC | PRN
Start: 2020-05-20 — End: 2021-03-09

## 2020-05-20 MED ORDER — OXYCODONE-ACETAMINOPHEN 5-325 MG PO TABS: 1.0000 | ORAL_TABLET | Freq: Four times a day (QID) | ORAL | 0 refills | Status: DC | PRN

## 2020-05-20 MED ORDER — ASPIRIN 81 MG PO CHEW: 81.0000 mg | CHEWABLE_TABLET | Freq: Two times a day (BID) | ORAL | 0 refills | Status: DC
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
## 2020-05-20 NOTE — TOC Transition Note (Signed)
Transition of Care [facility]) - CM/SW Discharge Note   Patient Details  Name: Chad Turner. MRN: [id number] Date of Birth: [birth year]  Transition of Care [facility]) CM/SW Contact:  Lennart Pall, LCSW Phone Number: 05/20/2020, 9:55 AM   Clinical Narrative:   Met briefly with pt to review dc needs.  Referrals in place (see below).  No further TOC needs.    Final next level of care: [place] Barriers to Discharge: No Barriers Identified   Patient Goals and CMS Choice Patient states their goals for this hospitalization and ongoing recovery are:: go home CMS Medicare.gov Compare Post Acute Care list provided to:: Patient Choice offered to / list presented to : Patient  Discharge Placement                       Discharge Plan and Services                DME Arranged: 3-N-1, Walker rolling DME Agency: [employer] Date DME Agency Contacted: 05/20/20 Time DME Agency Contacted: [phone] Representative spoke with at DME Agency: Ovid Curd HH Arranged: PT [place]: [facility] Date [place]:  (referral placed via ortho office PTA)      Social Determinants of Health (SDOH) Interventions     Readmission Risk Interventions Readmission Risk Prevention Plan 01/09/2020  Transportation Screening Complete  Medication Review [employer]) Referral to Pharmacy  PCP or Specialist appointment within 3-5 days of discharge Complete  HRI or [place] Complete  SW Recovery Care/Counseling Consult Complete  [place] Not Applicable  Some recent data might be hidden

## 2020-05-20 NOTE — Progress Notes (Signed)
Subjective: 1 Day Post-Op Procedure(s) (LRB): RIGHT TOTAL KNEE ARTHROPLASTY (Right)   Patient doing well and is looking forward to going home.  Activity level:  wbat Diet tolerance:  ok Voiding:  ok Patient reports pain as mild.    Objective: Vital signs in last 24 hours: Temp:  [97.5 F (36.4 C)-98.2 F (36.8 C)] 97.8 F (36.6 C) (07/21 0546) Pulse Rate:  [65-91] 85 (07/21 0546) Resp:  [8-23] 17 (07/21 0546) BP: (99-145)/(68-92) 145/87 (07/21 0546) SpO2:  [91 %-100 %] 100 % (07/21 0546)  Labs: No results for input(s): HGB in the last 72 hours. No results for input(s): WBC, RBC, HCT, PLT in the last 72 hours. Recent Labs    05/19/20 0555  NA 131*  K 2.6*  CL 96*  CO2 25  BUN <5*  CREATININE 0.72  GLUCOSE 116*  CALCIUM 9.0   No results for input(s): LABPT, INR in the last 72 hours.  Physical Exam:  Neurologically intact ABD soft Neurovascular intact Sensation intact distally Intact pulses distally Dorsiflexion/Plantar flexion intact Incision: dressing C/D/I and no drainage No cellulitis present Compartment soft  Assessment/Plan:  1 Day Post-Op Procedure(s) (LRB): RIGHT TOTAL KNEE ARTHROPLASTY (Right) Advance diet Up with therapy D/C IV fluids Discharge home with home health today after PT. Follow up in office 2 weeks post op. Continue on 71m asa BID x 2 weeks for DVT prevention.   ALarwance SachsNida 05/20/2020, 7:45 AM

## 2020-05-20 NOTE — Plan of Care (Signed)
Plan of care reviewed and discussed with the patient. 

## 2020-05-20 NOTE — Progress Notes (Signed)
Patient discharged to home w/ family. Given all belongings, instructions, equipment. Verbalized understanding of all instructions. Escorted to pov via w/c.

## 2020-05-20 NOTE — Progress Notes (Signed)
Physical Therapy Treatment Patient Details Name: Chad Turner. MRN: [id number] DOB: [birth year] Today's Date: 05/20/2020    History of Present Illness 59 yo male s/p R TKA 05/19/20. Hx of ETOH abuse, COVID 2020    PT Comments    Progressing well with mobility. Reviewed/practiced exercises, gait training and stair training. All education completed. Okay to d/c from PT standpoint.    Follow Up Recommendations  Follow surgeon's recommendation for DC plan and follow-up therapies     Equipment Recommendations  Rolling walker with 5" wheels;3in1 (PT)    Recommendations for Other Services       Precautions / Restrictions Precautions Precautions: Fall Restrictions Weight Bearing Restrictions: No Other Position/Activity Restrictions: WBAT    Mobility  Bed Mobility Overal bed mobility: Modified Independent                Transfers Overall transfer level: Needs assistance Equipment used: Rolling walker (2 wheeled) Transfers: Sit to/from Stand Sit to Stand: Min guard         General transfer comment: Min guard for safety. VCs safety, hand placement  Ambulation/Gait Ambulation/Gait assistance: Min guard Gait Distance (Feet): 60 Feet Assistive device: Rolling walker (2 wheeled) Gait Pattern/deviations: Step-to pattern;Antalgic;Decreased stance time - right     General Gait Details: VCs safety, technique, sequence.Min guard for safety.   Stairs Stairs: Yes Stairs assistance: Min guard Stair Management: Step to pattern;Forwards;With walker Number of Stairs: 1 General stair comments: VCs safety, technique, sequence. Min guard for safety.   Wheelchair Mobility    Modified Rankin (Stroke Patients Only)       Balance Overall balance assessment: Needs assistance         Standing balance support: Bilateral upper extremity supported Standing balance-Leahy Scale: Fair                              Cognition Arousal/Alertness:  Awake/alert Behavior During Therapy: WFL for tasks assessed/performed Overall Cognitive Status: Within Functional Limits for tasks assessed                                        Exercises Total Joint Exercises Ankle Circles/Pumps: AROM;Both;10 reps Quad Sets: AROM;Both;10 reps Hip ABduction/ADduction: AROM;Right;10 reps Straight Leg Raises: AROM;Right;10 reps Long Arc Quad: AROM;Right;5 reps Knee Flexion: AROM;Right;10 reps;Seated Goniometric ROM: ~10-70 degrees    General Comments        Pertinent Vitals/Pain Pain Assessment: 0-10 Pain Score: 5  Pain Location: R knee Pain Descriptors / Indicators: Sore;Discomfort Pain Intervention(s): Monitored during session;Ice applied;Repositioned    Home Living                      Prior Function            PT Goals (current goals can now be found in the care plan section) Progress towards PT goals: Progressing toward goals    Frequency    7X/week      PT Plan Current plan remains appropriate    Co-evaluation              AM-PAC PT "6 Clicks" Mobility   Outcome Measure  Help needed turning from your back to your side while in a flat bed without using bedrails?: None Help needed moving from lying on your back to sitting on the side of a flat bed without  using bedrails?: None Help needed moving to and from a bed to a chair (including a wheelchair)?: A Little Help needed standing up from a chair using your arms (e.g., wheelchair or bedside chair)?: A Little Help needed to walk in hospital room?: A Little Help needed climbing 3-5 steps with a railing? : A Little 6 Click Score: 20    End of Session Equipment Utilized During Treatment: Gait belt Activity Tolerance: Patient tolerated treatment well Patient left: in bed;with call bell/phone within reach;with bed alarm set   PT Visit Diagnosis: Pain;Other abnormalities of gait and mobility (R26.89) Pain - Right/Left: Right Pain - part of  body: Knee     Time: 4332-9518 PT Time Calculation (min) (ACUTE ONLY): 19 min  Charges:  $Gait Training: 8-22 mins                         Doreatha Massed, PT Acute Rehabilitation  Office: [phone] Pager: [phone]

## 2020-06-10 ENCOUNTER — Other Ambulatory Visit: Payer: Self-pay

## 2020-06-10 ENCOUNTER — Ambulatory Visit: Payer: Medicare Other | Attending: Orthopaedic Surgery | Admitting: Physical Therapy

## 2020-06-10 DIAGNOSIS — Z96651 Presence of right artificial knee joint: Secondary | ICD-10-CM | POA: Diagnosis present

## 2020-06-10 DIAGNOSIS — M6281 Muscle weakness (generalized): Secondary | ICD-10-CM

## 2020-06-10 DIAGNOSIS — M25561 Pain in right knee: Secondary | ICD-10-CM | POA: Diagnosis not present

## 2020-06-10 DIAGNOSIS — M25661 Stiffness of right knee, not elsewhere classified: Secondary | ICD-10-CM
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
## 2020-06-22 ENCOUNTER — Telehealth: Payer: Self-pay | Admitting: Physical Therapy

## 2020-06-22 ENCOUNTER — Ambulatory Visit: Payer: Medicare Other

## 2020-06-22 NOTE — Telephone Encounter (Signed)
Message left for Chad Turner about missed appointment today and next appointment 8/26 at 10 AM. PT returned call  And stated he would be here on Thursday. He did not feel well today.

## 2020-06-25 ENCOUNTER — Ambulatory Visit: Payer: Medicare Other

## 2020-06-25 ENCOUNTER — Other Ambulatory Visit: Payer: Self-pay

## 2020-06-25 DIAGNOSIS — M25561 Pain in right knee: Secondary | ICD-10-CM | POA: Diagnosis not present

## 2020-06-25 DIAGNOSIS — M6281 Muscle weakness (generalized): Secondary | ICD-10-CM

## 2020-06-25 DIAGNOSIS — M25661 Stiffness of right knee, not elsewhere classified: Secondary | ICD-10-CM

## 2020-06-25 DIAGNOSIS — Z96651 Presence of right artificial knee joint: Secondary | ICD-10-CM
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
## 2020-06-29 ENCOUNTER — Ambulatory Visit: Payer: Medicare Other

## 2020-07-01 ENCOUNTER — Ambulatory Visit: Payer: Medicare Other | Admitting: Physical Therapy

## 2020-07-07 ENCOUNTER — Ambulatory Visit: Payer: Medicare Other

## 2020-07-09 ENCOUNTER — Ambulatory Visit: Payer: Medicare Other | Attending: Orthopaedic Surgery

## 2020-07-09 ENCOUNTER — Other Ambulatory Visit: Payer: Self-pay

## 2020-07-09 DIAGNOSIS — M25561 Pain in right knee: Secondary | ICD-10-CM | POA: Diagnosis not present

## 2020-07-09 DIAGNOSIS — M25661 Stiffness of right knee, not elsewhere classified: Secondary | ICD-10-CM

## 2020-07-09 DIAGNOSIS — M6281 Muscle weakness (generalized): Secondary | ICD-10-CM | POA: Insufficient documentation

## 2020-07-09 DIAGNOSIS — Z96651 Presence of right artificial knee joint: Secondary | ICD-10-CM | POA: Insufficient documentation
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
## 2020-07-14 ENCOUNTER — Ambulatory Visit: Payer: Medicare Other | Admitting: Physical Therapy

## 2020-07-16 ENCOUNTER — Ambulatory Visit: Payer: Medicare Other | Admitting: Physical Therapy

## 2020-07-21 ENCOUNTER — Encounter: Payer: Medicare Other | Admitting: Physical Therapy

## 2020-07-23 ENCOUNTER — Ambulatory Visit: Payer: Medicare Other | Admitting: Physical Therapy

## 2020-07-28 ENCOUNTER — Ambulatory Visit: Payer: Medicare Other | Admitting: Physical Therapy

## 2020-07-28 ENCOUNTER — Other Ambulatory Visit: Payer: Self-pay

## 2020-07-28 ENCOUNTER — Encounter: Payer: Self-pay | Admitting: Physical Therapy

## 2020-07-28 DIAGNOSIS — M25561 Pain in right knee: Secondary | ICD-10-CM | POA: Diagnosis not present

## 2020-07-28 DIAGNOSIS — M25661 Stiffness of right knee, not elsewhere classified: Secondary | ICD-10-CM

## 2020-07-28 DIAGNOSIS — M6281 Muscle weakness (generalized): Secondary | ICD-10-CM

## 2020-07-28 DIAGNOSIS — Z96651 Presence of right artificial knee joint: Secondary | ICD-10-CM

## 2020-07-28 NOTE — Patient Instructions (Signed)
Access Code: [email or web address] URL: [email or web address] Date: 07/28/2020 Prepared by: Hilda Blades  Exercises Prone Quadriceps Stretch with Strap - 1 x daily - 7 x weekly - 2 reps - 20 seconds hold Supine Heel Slide with Strap - 1 x daily - 7 x weekly - 10 reps - 10 seconds hold Seated Knee Flexion Stretch - 1 x daily - 7 x weekly - 10 reps - 10 seconds hold Active Straight Leg Raise with Quad Set - 1 x daily - 7 x weekly - 2 sets - 10 reps Sit to Stand without Arm Support - 1 x daily - 7 x weekly - 2 sets - 10 reps Seated Knee Extension with Resistance - 1 x daily - 7 x weekly - 2 sets - 15 reps Seated Hamstring Curls with Resistance - 1 x daily - 7 x weekly - 3 sets - 10 reps Heel rises with counter support - 1 x daily - 7 x weekly - 2 sets - 20 reps Standing Hip Abduction with Counter Support - 1 x daily - 7 x weekly - 2 sets - 10 reps Standing Hip Extension with Counter Support - 1 x daily - 7 x weekly - 2 sets - 10 reps
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
## 2020-07-30 ENCOUNTER — Ambulatory Visit: Payer: Medicare Other | Admitting: Physical Therapy

## 2020-08-03 ENCOUNTER — Ambulatory Visit: Payer: Medicare Other | Attending: Orthopaedic Surgery | Admitting: Physical Therapy

## 2020-08-03 ENCOUNTER — Telehealth: Payer: Self-pay | Admitting: Physical Therapy

## 2020-08-03 DIAGNOSIS — M6281 Muscle weakness (generalized): Secondary | ICD-10-CM | POA: Insufficient documentation

## 2020-08-03 DIAGNOSIS — Z96651 Presence of right artificial knee joint: Secondary | ICD-10-CM | POA: Insufficient documentation

## 2020-08-03 DIAGNOSIS — M25561 Pain in right knee: Secondary | ICD-10-CM | POA: Insufficient documentation

## 2020-08-03 DIAGNOSIS — M25661 Stiffness of right knee, not elsewhere classified: Secondary | ICD-10-CM | POA: Insufficient documentation

## 2020-08-03 NOTE — Telephone Encounter (Signed)
Contacted patient due to missed PT appointment. He was reminded of next scheduled appointment and attendance policy.   Hilda Blades, PT, DPT, LAT, ATC 08/03/20  11:29 AM Phone: [phone] Fax: [phone]

## 2020-08-05 ENCOUNTER — Ambulatory Visit: Payer: Medicare Other | Admitting: Physical Therapy

## 2020-08-10 ENCOUNTER — Other Ambulatory Visit: Payer: Self-pay

## 2020-08-10 ENCOUNTER — Ambulatory Visit: Payer: Medicare Other | Admitting: Physical Therapy

## 2020-08-10 ENCOUNTER — Encounter: Payer: Self-pay | Admitting: Physical Therapy

## 2020-08-10 DIAGNOSIS — M25661 Stiffness of right knee, not elsewhere classified: Secondary | ICD-10-CM

## 2020-08-10 DIAGNOSIS — M25561 Pain in right knee: Secondary | ICD-10-CM | POA: Diagnosis not present

## 2020-08-10 DIAGNOSIS — Z96651 Presence of right artificial knee joint: Secondary | ICD-10-CM

## 2020-08-10 DIAGNOSIS — M6281 Muscle weakness (generalized): Secondary | ICD-10-CM
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
## 2020-08-12 ENCOUNTER — Telehealth: Payer: Self-pay | Admitting: Physical Therapy

## 2020-08-12 ENCOUNTER — Ambulatory Visit: Payer: Medicare Other | Admitting: Physical Therapy

## 2020-08-12 NOTE — Telephone Encounter (Signed)
Patient contacted due to missed PT appointment. He was informed of missed appointment, this was his last scheduled appointment so patient elected to schedule an appointment for tomorrow, 08/13/2020 at 9:15am. He was reminded of attendance policy and encouraged to call if he needed to reschedule or cancel. Patient expressed understanding.  Hilda Blades, PT, DPT, LAT, ATC 08/12/20  10:35 AM Phone: [phone] Fax: [phone]

## 2020-08-13 ENCOUNTER — Encounter: Payer: Self-pay | Admitting: Physical Therapy

## 2020-11-25 ENCOUNTER — Ambulatory Visit: Payer: Medicare Other | Admitting: Physician Assistant

## 2020-12-10 ENCOUNTER — Telehealth: Payer: Self-pay | Admitting: Physician Assistant

## 2020-12-10 NOTE — Telephone Encounter (Signed)
Called patient back to see why he dropped off paper work and He stated he needed clearance to have tooth pulled. Will Dentist to see what they need.

## 2020-12-10 NOTE — Telephone Encounter (Signed)
Patient calling to follow up on dental paperwork that was dropped off yesterday.

## 2020-12-11 NOTE — Telephone Encounter (Signed)
Dental paper work was placed in [employer] [facility] [place] [employer] for her to review.

## 2020-12-15 ENCOUNTER — Other Ambulatory Visit (INDEPENDENT_AMBULATORY_CARE_PROVIDER_SITE_OTHER): Payer: Medicare Other

## 2020-12-15 ENCOUNTER — Ambulatory Visit (INDEPENDENT_AMBULATORY_CARE_PROVIDER_SITE_OTHER): Payer: Medicare Other | Admitting: Physician Assistant

## 2020-12-15 ENCOUNTER — Encounter: Payer: Self-pay | Admitting: Physician Assistant

## 2020-12-15 VITALS — BP 166/84 | HR 88 | Ht 70.0 in | Wt 220.0 lb

## 2020-12-15 DIAGNOSIS — K7030 Alcoholic cirrhosis of liver without ascites: Secondary | ICD-10-CM

## 2020-12-15 DIAGNOSIS — Z8711 Personal history of peptic ulcer disease: Secondary | ICD-10-CM | POA: Diagnosis not present

## 2020-12-15 LAB — COMPREHENSIVE METABOLIC PANEL
ALT: 22 U/L (ref 0–53)
AST: 48 U/L — ABNORMAL HIGH (ref 0–37)
Albumin: 4.1 g/dL (ref 3.5–5.2)
Alkaline Phosphatase: 113 U/L (ref 39–117)
BUN: 8 mg/dL (ref 6–23)
CO2: 26 mEq/L (ref 19–32)
Calcium: 9.4 mg/dL (ref 8.4–10.5)
Chloride: 97 mEq/L (ref 96–112)
Creatinine, Ser: 0.75 mg/dL (ref 0.40–1.50)
GFR: 97.27 mL/min (ref >60.00)
Glucose, Bld: 99 mg/dL (ref 70–99)
Potassium: 4 mEq/L (ref 3.5–5.1)
Sodium: 129 mEq/L — ABNORMAL LOW (ref 135–145)
Total Bilirubin: 1 mg/dL (ref 0.2–1.2)
Total Protein: 8.6 g/dL — ABNORMAL HIGH (ref 6.0–8.3)

## 2020-12-15 LAB — CBC WITH DIFFERENTIAL/PLATELET
Basophils Absolute: 0.1 10*3/uL (ref 0.0–0.1)
Basophils Relative: 1.1 % (ref 0.0–3.0)
Eosinophils Absolute: 0.1 10*3/uL (ref 0.0–0.7)
Eosinophils Relative: 1.5 % (ref 0.0–5.0)
HCT: 33.6 % — ABNORMAL LOW (ref 39.0–52.0)
Hemoglobin: 10.4 g/dL — ABNORMAL LOW (ref 13.0–17.0)
Lymphocytes Relative: 23.9 % (ref 12.0–46.0)
Lymphs Abs: 2 10*3/uL (ref 0.7–4.0)
MCHC: 30.9 g/dL (ref 30.0–36.0)
MCV: 65.6 fl — ABNORMAL LOW (ref 78.0–100.0)
Monocytes Absolute: 0.9 10*3/uL (ref 0.1–1.0)
Monocytes Relative: 11.1 % (ref 3.0–12.0)
Neutro Abs: 5.3 10*3/uL (ref 1.4–7.7)
Neutrophils Relative %: 62.4 % (ref 43.0–77.0)
Platelets: 285 10*3/uL (ref 150.0–400.0)
RBC: 5.12 Mil/uL (ref 4.22–5.81)
RDW: 20.9 % — ABNORMAL HIGH (ref 11.5–15.5)
WBC: 8.5 10*3/uL (ref 4.0–10.5)

## 2020-12-15 LAB — PROTIME-INR
INR: 1.2 ratio — ABNORMAL HIGH (ref 0.8–1.0)
Prothrombin Time: 13.2 s — ABNORMAL HIGH (ref 9.6–13.1)

## 2020-12-15 LAB — IBC PANEL
Iron: 35 ug/dL — ABNORMAL LOW (ref 42–165)
Saturation Ratios: 6.2 % — ABNORMAL LOW (ref 20.0–50.0)
Transferrin: 403 mg/dL — ABNORMAL HIGH (ref 212.0–360.0)

## 2020-12-15 NOTE — Patient Instructions (Addendum)
If you are age 62 or older, your body mass index should be between 23-30. Your Body mass index is 31.57 kg/m. If this is out of the aforementioned range listed, please consider follow up with your Primary Care Provider.  If you are age 87 or younger, your body mass index should be between 19-25. Your Body mass index is 31.57 kg/m. If this is out of the aformentioned range listed, please consider follow up with your Primary Care Provider.   You have been scheduled for an endoscopy. Please follow written instructions given to you at your visit today. If you use inhalers (even only as needed), please bring them with you on the day of your procedure.  Your provider has requested that you go to the basement level for lab work before leaving today. Press "B" on the elevator. The lab is located at the first door on the left as you exit the elevator.  Please avoid alcohol.  Thank you for choosing me and [facility].  [name], PA-C
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
## 2020-12-16 LAB — AFP TUMOR MARKER: AFP-Tumor Marker: 6 ng/mL (ref <6.1)

## 2020-12-19 NOTE — Progress Notes (Signed)
Addendum: Reviewed and agree with assessment and management plan. Jalissa Heinzelman M, MD  

## 2020-12-31 ENCOUNTER — Other Ambulatory Visit: Payer: Self-pay

## 2020-12-31 ENCOUNTER — Ambulatory Visit (HOSPITAL_COMMUNITY)
Admission: RE | Admit: 2020-12-31 | Discharge: 2020-12-31 | Disposition: A | Discharge status: Home / Self Care | Payer: Medicare Other | Source: Ambulatory Visit | Attending: Physician Assistant | Admitting: Physician Assistant

## 2020-12-31 DIAGNOSIS — Z8711 Personal history of peptic ulcer disease: Secondary | ICD-10-CM | POA: Insufficient documentation

## 2020-12-31 DIAGNOSIS — K7030 Alcoholic cirrhosis of liver without ascites: Secondary | ICD-10-CM | POA: Diagnosis present

## 2021-01-07 ENCOUNTER — Other Ambulatory Visit: Payer: Self-pay

## 2021-01-07 ENCOUNTER — Telehealth: Payer: Self-pay | Admitting: Internal Medicine

## 2021-01-07 ENCOUNTER — Other Ambulatory Visit: Payer: Self-pay | Admitting: Internal Medicine

## 2021-01-07 ENCOUNTER — Ambulatory Visit (AMBULATORY_SURGERY_CENTER): Payer: Medicare Other | Admitting: Internal Medicine

## 2021-01-07 ENCOUNTER — Encounter: Payer: Self-pay | Admitting: Internal Medicine

## 2021-01-07 VITALS — BP 123/66 | HR 80 | Temp 97.3°F | Resp 15 | Ht 70.0 in | Wt 220.0 lb

## 2021-01-07 DIAGNOSIS — K7030 Alcoholic cirrhosis of liver without ascites: Secondary | ICD-10-CM

## 2021-01-07 DIAGNOSIS — K2950 Unspecified chronic gastritis without bleeding: Secondary | ICD-10-CM | POA: Diagnosis not present

## 2021-01-07 DIAGNOSIS — K2940 Chronic atrophic gastritis without bleeding: Secondary | ICD-10-CM | POA: Diagnosis not present

## 2021-01-07 DIAGNOSIS — K2960 Other gastritis without bleeding: Secondary | ICD-10-CM | POA: Diagnosis not present

## 2021-01-07 DIAGNOSIS — K259 Gastric ulcer, unspecified as acute or chronic, without hemorrhage or perforation: Secondary | ICD-10-CM

## 2021-01-07 DIAGNOSIS — K2100 Gastro-esophageal reflux disease with esophagitis, without bleeding: Secondary | ICD-10-CM | POA: Diagnosis not present

## 2021-01-07 DIAGNOSIS — Z8711 Personal history of peptic ulcer disease: Secondary | ICD-10-CM

## 2021-01-07 DIAGNOSIS — K3189 Other diseases of stomach and duodenum: Secondary | ICD-10-CM | POA: Diagnosis not present

## 2021-01-07 MED ORDER — SODIUM CHLORIDE 0.9 % IV SOLN: 500.0000 mL | INTRAVENOUS | Status: DC

## 2021-01-07 MED ORDER — PANTOPRAZOLE SODIUM 40 MG PO TBEC: 40.0000 mg | DELAYED_RELEASE_TABLET | Freq: Two times a day (BID) | ORAL | 1 refills | Status: DC

## 2021-01-07 NOTE — Telephone Encounter (Signed)
Patient called stating he was to have pantoprazole called into the [facility] on [address].  He stated when he went they did not have an order for this. Please call in again.

## 2021-01-07 NOTE — Progress Notes (Signed)
Report to PACU, RN, vss, BBS= Clear.  

## 2021-01-07 NOTE — Patient Instructions (Signed)
YOU HAD AN ENDOSCOPIC PROCEDURE TODAY AT THE  ENDOSCOPY CENTER:   Refer to the procedure report that was given to you for any specific questions about what was found during the examination.  If the procedure report does not answer your questions, please call your gastroenterologist to clarify.  If you requested that your care partner not be given the details of your procedure findings, then the procedure report has been included in a sealed envelope for you to review at your convenience later.  YOU SHOULD EXPECT: Some feelings of bloating in the abdomen. Passage of more gas than usual.  Walking can help get rid of the air that was put into your GI tract during the procedure and reduce the bloating. If you had a lower endoscopy (such as a colonoscopy or flexible sigmoidoscopy) you may notice spotting of blood in your stool or on the toilet paper. If you underwent a bowel prep for your procedure, you may not have a normal bowel movement for a few days.  Please Note:  You might notice some irritation and congestion in your nose or some drainage.  This is from the oxygen used during your procedure.  There is no need for concern and it should clear up in a day or so.  SYMPTOMS TO REPORT IMMEDIATELY:   Following lower endoscopy (colonoscopy or flexible sigmoidoscopy):  Excessive amounts of blood in the stool  Significant tenderness or worsening of abdominal pains  Swelling of the abdomen that is new, acute  Fever of 100F or higher   Following upper endoscopy (EGD)  Vomiting of blood or coffee ground material  New chest pain or pain under the shoulder blades  Painful or persistently difficult swallowing  New shortness of breath  Fever of 100F or higher  Black, tarry-looking stools  For urgent or emergent issues, a gastroenterologist can be reached at any hour by calling [phone]. Do not use MyChart messaging for urgent concerns.    DIET:  We do recommend a small meal at first, but  then you may proceed to your regular diet.  Drink plenty of fluids but you should avoid alcoholic beverages for 24 hours.  ACTIVITY:  You should plan to take it easy for the rest of today and you should NOT DRIVE or use heavy machinery until tomorrow (because of the sedation medicines used during the test).    FOLLOW UP: Our staff will call the number listed on your records 48-72 hours following your procedure to check on you and address any questions or concerns that you may have regarding the information given to you following your procedure. If we do not reach you, we will leave a message.  We will attempt to reach you two times.  During this call, we will ask if you have developed any symptoms of COVID 19. If you develop any symptoms (ie: fever, flu-like symptoms, shortness of breath, cough etc.) before then, please call [phone].  If you test positive for Covid 19 in the 2 weeks post procedure, please call and report this information to us.    If any biopsies were taken you will be contacted by phone or by letter within the next 1-3 weeks.  Please call us at [phone] if you have not heard about the biopsies in 3 weeks.    SIGNATURES/CONFIDENTIALITY: You and/or your care partner have signed paperwork which will be entered into your electronic medical record.  These signatures attest to the fact that that the information above on   your After Visit Summary has been reviewed and is understood.  Full responsibility of the confidentiality of this discharge information lies with you and/or your care-partner. 

## 2021-01-07 NOTE — Progress Notes (Signed)
Pt's states no medical or surgical changes since previsit or office visit. 

## 2021-01-07 NOTE — Telephone Encounter (Signed)
Rx sent. Appears Dr Hilarie Fredrickson wanted patient to have pantoprazole after procedure earlier today.

## 2021-01-07 NOTE — Progress Notes (Signed)
Patient unclear about all is medications.  Doesn't know the names

## 2021-01-07 NOTE — Op Note (Signed)
[facility] Patient Name: Chad Turner Procedure Date: 01/07/2021 11:04 AM MRN: [id number] Endoscopist: Jerene Bears , MD Age: 60 Referring MD:  Date of Birth: [birth year] Gender: Male Account #: [id number] Procedure:                Upper GI endoscopy Indications:              Follow-up of peptic ulcer with hemorrhage seen at                            EGD in March 2021 also with H. Pylori at that time,                            history of cirrhosis with small varices seen in                            March 2021 Medicines:                Monitored Anesthesia Care Procedure:                Pre-Anesthesia Assessment:                           - Prior to the procedure, a History and Physical                            was performed, and patient medications and                            allergies were reviewed. The patient's tolerance of                            previous anesthesia was also reviewed. The risks                            and benefits of the procedure and the sedation                            options and risks were discussed with the patient.                            All questions were answered, and informed consent                            was obtained. Prior Anticoagulants: The patient has                            taken no previous anticoagulant or antiplatelet                            agents. ASA Grade Assessment: III - A patient with                            severe systemic disease. After reviewing the risks  and benefits, the patient was deemed in                            satisfactory condition to undergo the procedure.                           After obtaining informed consent, the endoscope was                            passed under direct vision. Throughout the                            procedure, the patient's blood pressure, pulse, and                            oxygen saturations were monitored  continuously. The                            Endoscope was introduced through the mouth, and                            advanced to the second part of duodenum. The upper                            GI endoscopy was accomplished without difficulty.                            The patient tolerated the procedure well. Scope In: Scope Out: Findings:                 LA Grade A (one or more mucosal breaks less than 5                            mm, not extending between tops of 2 mucosal folds)                            esophagitis with no bleeding was found at the                            gastroesophageal junction.                           A 2 cm hiatal hernia was present.                           There is no endoscopic evidence of varices in the                            entire esophagus.                           One non-bleeding cratered gastric ulcer was found                            in  the gastric body. The lesion was 8 mm in largest                            dimension. Biopsies were taken with a cold forceps                            for histology.                           Moderate inflammation characterized by congestion                            (edema), erythema and granularity was found in the                            gastric body and in the gastric antrum. There were                            patches of intestinalized mucosa. Biopsies were                            taken with a cold forceps for histology and                            Helicobacter pylori testing.                           The examined duodenum was normal. Complications:            No immediate complications. Estimated Blood Loss:     Estimated blood loss was minimal. Impression:               - LA Grade A esophagitis with no bleeding.                           - Non-bleeding gastric ulcer. Biopsied.                           - Gastritis. Biopsied.                           - Normal examined  duodenum. Recommendation:           - Patient has a contact number available for                            emergencies. The signs and symptoms of potential                            delayed complications were discussed with the                            patient. Return to normal activities tomorrow.                            Written discharge instructions were provided to the  patient.                           - Resume previous diet.                           - Continue present medications.                           - Await pathology results.                           - Pantoprazole 40 mg twice daily x 6 weeks then                            once daily thereafter (likely indefinitely and                            certainly if NSAIDs are used).                           - On beta blocker therapy and thereafter                            surveillance EGD for variceal surveillance is not                            needed unless there is decompensation in liver                            disease. Jerene Bears, MD 01/07/2021 11:30:28 AM This report has been signed electronically.

## 2021-01-07 NOTE — Progress Notes (Signed)
Called to room to assist during endoscopic procedure.  Patient ID and intended procedure confirmed with present staff. Received instructions for my participation in the procedure from the performing physician.  

## 2021-01-12 ENCOUNTER — Telehealth: Payer: Self-pay | Admitting: *Deleted

## 2021-01-12 NOTE — Telephone Encounter (Signed)
Attempted f/u phone call. No answer. Left message. °

## 2021-01-12 NOTE — Telephone Encounter (Signed)
  Follow up Call-  Call back number 01/07/2021 11/05/2019  Post procedure Call Back phone  # [phone]  Permission to leave phone message Yes Yes  Some recent data might be hidden     Patient questions:  Do you have a fever, pain , or abdominal swelling? No. Pain Score  0 *  Have you tolerated food without any problems? Yes.    Have you been able to return to your normal activities? Yes.    Do you have any questions about your discharge instructions: Diet   No. Medications  No. Follow up visit  No.  Do you have questions or concerns about your Care? No.  Actions: * If pain score is 4 or above: No action needed, pain <4.  1. Have you developed a fever since your procedure? no  2.   Have you had an respiratory symptoms (SOB or cough) since your procedure? no  3.   Have you tested positive for COVID 19 since your procedure no  4.   Have you had any family members/close contacts diagnosed with the COVID 19 since your procedure?  no   If yes to any of these questions please route to Joylene John, RN and Joella Prince, RN

## 2021-01-20 ENCOUNTER — Encounter: Payer: Self-pay | Admitting: Internal Medicine

## 2021-02-03 ENCOUNTER — Other Ambulatory Visit: Payer: Self-pay | Admitting: Internal Medicine

## 2021-02-05 ENCOUNTER — Other Ambulatory Visit: Payer: Self-pay | Admitting: Orthopaedic Surgery

## 2021-02-05 DIAGNOSIS — Z01818 Encounter for other preprocedural examination: Secondary | ICD-10-CM
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
## 2021-03-04 NOTE — Patient Instructions (Addendum)
DUE TO COVID-19 ONLY ONE VISITOR IS ALLOWED TO COME WITH YOU AND STAY IN THE WAITING ROOM ONLY DURING PRE OP AND PROCEDURE DAY OF SURGERY.  2 VISITORS  MAY VISIT WITH YOU AFTER SURGERY IN YOUR PRIVATE ROOM DURING VISITING HOURS ONLY!  YOU NEED TO HAVE A COVID 19 TEST ON_5/6/22______ @__2 ; 20_pm____, THIS TEST MUST BE DONE BEFORE SURGERY,   COVID TESTING SITE [address] [place] [address] [id number],  IT IS ON THE RIGHT GOING OUT [address] Chad Turner APPROXIMATELY  2 MINUTES PAST Chad Turner ON THE RIGHT. ONCE YOUR COVID TEST IS COMPLETED,   PLEASE BEGIN THE QUARANTINE INSTRUCTIONS AS OUTLINED IN YOUR HANDOUT.                Chad Turner.   Your procedure is scheduled on: 03/09/21   Report to Chad Turner Main  Entrance   Report to short stay at 5:15 AM     Call this number if you have problems the morning of surgery Chad Turner, NO Chad Turner.   No food after midnight.    You may have clear liquid until 4:30 AM.    At 4:00 AM drink pre surgery drink.   Nothing by mouth after 4:30 AM.    Take these medicines the morning of surgery with A SIP OF WATER: Use your inhaler and bring it with you                                 You may not have any metal on your body including              piercings  Do not wear jewelry, , lotions, powders or deodorant              Men may shave face and neck.   Do not bring valuables to the hospital. Chad Turner.  Contacts, dentures or bridgework may not be worn into surgery.                  Chad Turner - Preparing for Surgery Before surgery, you can play an important role.  Because skin is not sterile, your skin needs to be as free of germs as possible.  You can reduce the number of germs on your skin by washing with CHG (chlorahexidine gluconate) soap before surgery.  CHG is an antiseptic  cleaner which kills germs and bonds with the skin to continue killing germs even after washing. Please DO NOT use if you have an allergy to CHG or antibacterial soaps.  If your skin becomes reddened/irritated stop using the CHG and inform your nurse when you arrive at Short Stay.   You may shave your face/neck.  Please follow these instructions carefully:   1.  Shower with CHG Soap the night before surgery and the  morning of Surgery.   2.  If you choose to wash your hair, wash your hair first as usual with your  normal  Shampoo.   3.  After you shampoo, rinse your hair and body thoroughly to remove the  Shampoo.  4.  Use CHG as you would any other liquid soap.  You can apply chg directly  to the skin and wash                       Gently with a scrungie or clean washcloth .  5.  Apply the CHG Soap to your body ONLY FROM THE NECK DOWN.   Do not use on face/ open                           Wound or open sores. Avoid contact with eyes, ears mouth and genitals (private parts).                       Wash face,  Genitals (private parts) with your normal soap.              6.  Wash thoroughly, paying special attention to the area where your surgery  will be performed.   7.  Thoroughly rinse your body with warm water from the neck down.   8.  DO NOT shower/wash with your normal soap after using and rinsing off  the CHG Soap.              9.  Pat yourself dry with a clean towel.             10.  Wear clean pajamas.             11.  Place clean sheets on your bed the night of your first shower and do not  sleep with pets.  Day of Surgery : Do not apply any lotions/deodorants the morning of surgery.  Please wear clean clothes to the hospital/surgery center.  FAILURE TO FOLLOW THESE INSTRUCTIONS MAY RESULT IN THE CANCELLATION OF YOUR SURGERY PATIENT SIGNATURE_________________________________  NURSE  SIGNATURE__________________________________  ________________________________________________________________________   Chad [place]  An incentive spirometer is a tool that can help keep your lungs clear and active. This tool measures how well you are filling your lungs with each breath. Taking long deep breaths may help reverse or decrease the chance of developing breathing (pulmonary) problems (especially infection) following:  A long period of time when you are unable to move or be active. BEFORE THE PROCEDURE   If the spirometer includes an indicator to show your best effort, your nurse or respiratory therapist will set it to a desired goal.  If possible, sit up straight or lean slightly forward. Try not to slouch.  Hold the incentive spirometer in an upright position. INSTRUCTIONS FOR USE  1. Sit on the edge of your bed if possible, or sit up as far as you can in bed or on a chair. 2. Hold the incentive spirometer in an upright position. 3. Breathe out normally. 4. Place the mouthpiece in your mouth and seal your lips tightly around it. 5. Breathe in slowly and as deeply as possible, raising the piston or the ball toward the top of the column. 6. Hold your breath for 3-5 seconds or for as long as possible. Allow the piston or ball to fall to the bottom of the column. 7. Remove the mouthpiece from your mouth and breathe out normally. 8. Rest for a few seconds and repeat Steps 1 through 7 at least 10 times every 1-2 hours when you are awake. Take your time and take a few normal breaths between deep breaths. 9. The spirometer may  include an indicator to show your best effort. Use the indicator as a goal to work toward during each repetition. 10. After each set of 10 deep breaths, practice coughing to be sure your lungs are clear. If you have an incision (the cut made at the time of surgery), support your incision when coughing by placing a pillow or rolled up towels firmly  against it. Once you are able to get out of bed, walk around indoors and cough well. You may stop using the incentive spirometer when instructed by your caregiver.  RISKS AND COMPLICATIONS  Take your time so you do not get dizzy or light-headed.  If you are in pain, you may need to take or ask for pain medication before doing incentive spirometry. It is harder to take a deep breath if you are having pain. AFTER USE  Rest and breathe slowly and easily.  It can be helpful to keep track of a log of your progress. Your caregiver can provide you with a simple table to help with this. If you are using the spirometer at home, follow these instructions: [place] IF:   You are having difficultly using the spirometer.  You have trouble using the spirometer as often as instructed.  Your pain medication is not giving enough relief while using the spirometer.  You develop fever of 100.5 F (38.1 C) or higher. SEEK IMMEDIATE MEDICAL CARE IF:   You cough up bloody sputum that had not been present before.  You develop fever of 102 F (38.9 C) or greater.  You develop worsening pain at or near the incision site. MAKE SURE YOU:   Understand these instructions.  Will watch your condition.  Will get help right away if you are not doing well or get worse. Document Released: 02/27/2007 Document Revised: 01/09/2012 Document Reviewed: 04/30/2007 [facility] Patient Information 2014 [place], [place].   ________________________________________________________________________

## 2021-03-05 ENCOUNTER — Encounter (HOSPITAL_COMMUNITY): Payer: Self-pay

## 2021-03-05 ENCOUNTER — Other Ambulatory Visit (HOSPITAL_COMMUNITY)
Admission: RE | Admit: 2021-03-05 | Discharge: 2021-03-05 | Disposition: A | Discharge status: Home / Self Care | Payer: Medicare Other | Source: Ambulatory Visit | Attending: Orthopaedic Surgery | Admitting: Orthopaedic Surgery

## 2021-03-05 ENCOUNTER — Encounter (HOSPITAL_COMMUNITY)
Admission: RE | Admit: 2021-03-05 | Discharge: 2021-03-05 | Disposition: A | Discharge status: Home / Self Care | Payer: Medicare Other | Source: Ambulatory Visit | Attending: Orthopaedic Surgery | Admitting: Orthopaedic Surgery

## 2021-03-05 ENCOUNTER — Ambulatory Visit (HOSPITAL_COMMUNITY)
Admission: RE | Admit: 2021-03-05 | Discharge: 2021-03-05 | Disposition: A | Discharge status: Home / Self Care | Payer: Medicare Other | Source: Ambulatory Visit | Attending: Orthopaedic Surgery | Admitting: Orthopaedic Surgery

## 2021-03-05 ENCOUNTER — Other Ambulatory Visit: Payer: Self-pay

## 2021-03-05 DIAGNOSIS — Z20822 Contact with and (suspected) exposure to covid-19: Secondary | ICD-10-CM | POA: Insufficient documentation

## 2021-03-05 DIAGNOSIS — Z01818 Encounter for other preprocedural examination: Secondary | ICD-10-CM | POA: Insufficient documentation

## 2021-03-05 LAB — PROTIME-INR
INR: 1.2 (ref 0.8–1.2)
Prothrombin Time: 15 seconds (ref 11.4–15.2)

## 2021-03-05 LAB — URINALYSIS, ROUTINE W REFLEX MICROSCOPIC
Bilirubin Urine: NEGATIVE
Glucose, UA: NEGATIVE mg/dL
Hgb urine dipstick: NEGATIVE
Ketones, ur: NEGATIVE mg/dL
Leukocytes,Ua: NEGATIVE
Nitrite: NEGATIVE
Protein, ur: NEGATIVE mg/dL
Specific Gravity, Urine: 1.005 (ref 1.005–1.030)
pH: 6 (ref 5.0–8.0)

## 2021-03-05 LAB — SARS CORONAVIRUS 2 (TAT 6-24 HRS): SARS Coronavirus 2: NEGATIVE

## 2021-03-05 LAB — BASIC METABOLIC PANEL
Anion gap: 12 (ref 5–15)
BUN: <5 mg/dL — ABNORMAL LOW (ref 8–23)
CO2: 23 mmol/L (ref 22–32)
Calcium: 9.5 mg/dL (ref 8.9–10.3)
Chloride: 104 mmol/L (ref 98–111)
Creatinine, Ser: 1.07 mg/dL (ref 0.61–1.24)
GFR, Estimated: >60 mL/min (ref >60)
Glucose, Bld: 102 mg/dL — ABNORMAL HIGH (ref 70–99)
Potassium: 4.2 mmol/L (ref 3.5–5.1)
Sodium: 139 mmol/L (ref 135–145)

## 2021-03-05 LAB — CBC WITH DIFFERENTIAL/PLATELET
Abs Immature Granulocytes: 0.03 10*3/uL (ref 0.00–0.07)
Basophils Absolute: 0.1 10*3/uL (ref 0.0–0.1)
Basophils Relative: 1 %
Eosinophils Absolute: 0.2 10*3/uL (ref 0.0–0.5)
Eosinophils Relative: 2 %
HCT: 44.8 % (ref 39.0–52.0)
Hemoglobin: 13.9 g/dL (ref 13.0–17.0)
Immature Granulocytes: 1 %
Lymphocytes Relative: 30 %
Lymphs Abs: 1.9 10*3/uL (ref 0.7–4.0)
MCH: 25.6 pg — ABNORMAL LOW (ref 26.0–34.0)
MCHC: 31 g/dL (ref 30.0–36.0)
MCV: 82.5 fL (ref 80.0–100.0)
Monocytes Absolute: 0.8 10*3/uL (ref 0.1–1.0)
Monocytes Relative: 12 %
Neutro Abs: 3.5 10*3/uL (ref 1.7–7.7)
Neutrophils Relative %: 54 %
Platelets: 261 10*3/uL (ref 150–400)
RBC: 5.43 MIL/uL (ref 4.22–5.81)
RDW: 18.7 % — ABNORMAL HIGH (ref 11.5–15.5)
WBC: 6.5 10*3/uL (ref 4.0–10.5)
nRBC: 0 % (ref 0.0–0.2)

## 2021-03-05 LAB — SURGICAL PCR SCREEN
MRSA, PCR: NEGATIVE
Staphylococcus aureus: NEGATIVE

## 2021-03-05 LAB — APTT: aPTT: 37 seconds — ABNORMAL HIGH (ref 24–36)

## 2021-03-05 NOTE — Progress Notes (Signed)
COVID Vaccine Completed:Yes Date COVID Vaccine completed:12/2021Booster 12/2020 COVID vaccine manufacturer: [place]     PCP - . Josph Macho. [facility] FNP Cardiologist -  Dr. Bertrum Sol- for ECHO  Chest x-ray - no EKG - 03/05/21-chart, epic Stress Test - no ECHO - 01/29/20 Cardiac Cath - no Pacemaker/ICD device last checked:NA  Sleep Study - no CPAP -   Fasting Blood Sugar - NA Checks Blood Sugar _____ times a day  Blood Thinner Instructions:NA Aspirin Instructions: Last Dose:  Anesthesia review:   Patient denies shortness of breath, fever, cough and chest pain at PAT appointment  Yes. Pt had covid and has an inhaler. He states that he doesn't need it and has no SOB.  Patient verbalized understanding of instructions that were given to them at the PAT appointment. Patient was also instructed that they will need to review over the PAT instructions again at home before surgery.Yes Pt is illiterate and I read instructions to him. I told him to have his girlfriend go over the instructions with him. I put the Covid testing site into his phone's GPS.

## 2021-03-08 MED ORDER — TRANEXAMIC ACID 1000 MG/10ML IV SOLN
2000.0000 mg | INTRAVENOUS | Status: DC
  Filled 2021-03-08: qty 20

## 2021-03-08 MED ORDER — BUPIVACAINE LIPOSOME 1.3 % IJ SUSP
10.0000 mL | INTRAMUSCULAR | Status: DC
  Filled 2021-03-08: qty 10

## 2021-03-08 NOTE — Anesthesia Preprocedure Evaluation (Addendum)
Anesthesia Evaluation  Patient identified by MRN, date of birth, ID band Patient awake    Reviewed: Allergy & Precautions, NPO status , Patient's Chart, lab work & pertinent test results  Airway Mallampati: II  TM Distance: >3 FB Neck ROM: Full    Dental no notable dental hx. (+) Poor Dentition,    Pulmonary neg pulmonary ROS,    Pulmonary exam normal breath sounds clear to auscultation       Cardiovascular hypertension, Pt. on medications +CHF  Normal cardiovascular exam Rhythm:Regular Rate:Normal  12/2019 Echo 1. Left ventricular ejection fraction, by estimation, is 60 to 65%. The left ventricle has normal function. The left ventricle has no regional wall motion abnormalities. Left ventricular diastolic parameters are consistent with Grade II diastolic dysfunction (pseudonormalization). Elevated left atrial pressure. 2. Right ventricular systolic function is normal. The right ventricular size is normal. Tricuspid regurgitation signal is inadequate for assessing PA pressure. 3. Left atrial size was mildly dilated. 4. The mitral valve is myxomatous. No evidence of mitral valve regurgitation. No evidence of mitral stenosis. 5. The aortic valve is normal in structure. Aortic valve regurgitation is not visualized. No aortic stenosis is present. 6. The inferior vena cava is normal in size with greater than 50% respiratory variability,   Neuro/Psych PSYCHIATRIC DISORDERS Depression negative neurological ROS     GI/Hepatic PUD, GERD  Medicated and Controlled,(+) Cirrhosis   Esophageal Varices  substance abuse  alcohol use, cocaine use and marijuana use,   Endo/Other    Renal/GU Lab Results      Component                Value               Date                      CREATININE               1.07                03/05/2021                BUN                      <5 (L)              03/05/2021                NA                        139                 03/05/2021                K                        4.2                 03/05/2021                CL                       104                 03/05/2021                CO2  23                  03/05/2021                Musculoskeletal  (+) Arthritis , Osteoarthritis,    Abdominal (+) + obese,   Peds  Hematology Lab Results      Component                Value               Date                      WBC                      6.5                 03/05/2021                HGB                      13.9                03/05/2021                HCT                      44.8                03/05/2021                MCV                      82.5                03/05/2021                PLT                      261                 03/05/2021              Anesthesia Other Findings   Reproductive/Obstetrics                           Anesthesia Physical Anesthesia Plan  ASA: III  Anesthesia Plan: Spinal   Post-op Pain Management:    Induction:   PONV Risk Score and Plan: Treatment may vary due to age or medical condition, Ondansetron, Midazolam and Dexamethasone  Airway Management Planned: Natural Airway and Nasal Cannula  Additional Equipment: None  Intra-op Plan:   Post-operative Plan:   Informed Consent: I have reviewed the patients History and Physical, chart, labs and discussed the procedure including the risks, benefits and alternatives for the proposed anesthesia with the patient or authorized representative who has indicated his/her understanding and acceptance.     Dental advisory given  Plan Discussed with: CRNA  Anesthesia Plan Comments: (spinal)       Anesthesia Quick Evaluation

## 2021-03-09 ENCOUNTER — Encounter (HOSPITAL_COMMUNITY): Payer: Self-pay | Admitting: Orthopaedic Surgery

## 2021-03-09 ENCOUNTER — Ambulatory Visit (HOSPITAL_COMMUNITY)
Admission: RE | Admit: 2021-03-09 | Discharge: 2021-03-09 | Disposition: A | Discharge status: Home / Self Care | Payer: Medicare Other | Attending: Orthopaedic Surgery | Admitting: Orthopaedic Surgery

## 2021-03-09 ENCOUNTER — Ambulatory Visit (HOSPITAL_COMMUNITY): Payer: Medicare Other | Admitting: Certified Registered"

## 2021-03-09 ENCOUNTER — Encounter (HOSPITAL_COMMUNITY)
Admission: RE | Disposition: A | Discharge status: Home / Self Care | Payer: Self-pay | Source: Home / Self Care | Attending: Orthopaedic Surgery

## 2021-03-09 ENCOUNTER — Ambulatory Visit (HOSPITAL_COMMUNITY): Payer: Medicare Other

## 2021-03-09 DIAGNOSIS — M1612 Unilateral primary osteoarthritis, left hip: Secondary | ICD-10-CM | POA: Diagnosis present

## 2021-03-09 DIAGNOSIS — M170 Bilateral primary osteoarthritis of knee: Secondary | ICD-10-CM | POA: Insufficient documentation

## 2021-03-09 DIAGNOSIS — Z8616 Personal history of COVID-19: Secondary | ICD-10-CM | POA: Insufficient documentation

## 2021-03-09 DIAGNOSIS — Z8546 Personal history of malignant neoplasm of prostate: Secondary | ICD-10-CM | POA: Diagnosis not present

## 2021-03-09 DIAGNOSIS — Z79899 Other long term (current) drug therapy: Secondary | ICD-10-CM | POA: Diagnosis not present

## 2021-03-09 DIAGNOSIS — K7460 Unspecified cirrhosis of liver: Secondary | ICD-10-CM | POA: Diagnosis not present

## 2021-03-09 DIAGNOSIS — Z833 Family history of diabetes mellitus: Secondary | ICD-10-CM | POA: Diagnosis not present

## 2021-03-09 DIAGNOSIS — Z880 Allergy status to penicillin: Secondary | ICD-10-CM | POA: Insufficient documentation

## 2021-03-09 DIAGNOSIS — K259 Gastric ulcer, unspecified as acute or chronic, without hemorrhage or perforation: Secondary | ICD-10-CM | POA: Diagnosis not present

## 2021-03-09 DIAGNOSIS — Z791 Long term (current) use of non-steroidal anti-inflammatories (NSAID): Secondary | ICD-10-CM | POA: Diagnosis not present

## 2021-03-09 DIAGNOSIS — Z7951 Long term (current) use of inhaled steroids: Secondary | ICD-10-CM | POA: Insufficient documentation

## 2021-03-09 DIAGNOSIS — Z419 Encounter for procedure for purposes other than remedying health state, unspecified: Secondary | ICD-10-CM

## 2021-03-09 HISTORY — PX: TOTAL HIP ARTHROPLASTY: SHX124

## 2021-03-09 LAB — TYPE AND SCREEN
ABO/RH(D): O POS
Antibody Screen: NEGATIVE

## 2021-03-09 SURGERY — ARTHROPLASTY, HIP, TOTAL, ANTERIOR APPROACH
Anesthesia: Spinal | Site: Hip | Laterality: Left

## 2021-03-09 MED ORDER — TRANEXAMIC ACID 1000 MG/10ML IV SOLN
INTRAVENOUS | Status: DC | PRN
  Administered 2021-03-09: 2000 mg via TOPICAL

## 2021-03-09 MED ORDER — ASPIRIN EC 81 MG PO TBEC: 81.0000 mg | DELAYED_RELEASE_TABLET | Freq: Two times a day (BID) | ORAL | 0 refills | Status: AC

## 2021-03-09 MED ORDER — OXYCODONE HCL 5 MG/5ML PO SOLN
5.0000 mg | Freq: Once | ORAL | Status: AC | PRN
Start: 2021-03-09 — End: 2021-03-09

## 2021-03-09 MED ORDER — DEXAMETHASONE SODIUM PHOSPHATE 10 MG/ML IJ SOLN
INTRAMUSCULAR | Status: DC | PRN
  Administered 2021-03-09: 4 mg via INTRAVENOUS

## 2021-03-09 MED ORDER — TRANEXAMIC ACID-NACL 1000-0.7 MG/100ML-% IV SOLN
1000.0000 mg | INTRAVENOUS | Status: AC
  Administered 2021-03-09: 1000 mg via INTRAVENOUS
  Filled 2021-03-09: qty 100

## 2021-03-09 MED ORDER — EPHEDRINE SULFATE-NACL 50-0.9 MG/10ML-% IV SOSY
PREFILLED_SYRINGE | INTRAVENOUS | Status: DC | PRN
  Administered 2021-03-09: 10 mg via INTRAVENOUS

## 2021-03-09 MED ORDER — PROPOFOL 10 MG/ML IV BOLUS
INTRAVENOUS | Status: AC
  Filled 2021-03-09: qty 20

## 2021-03-09 MED ORDER — BUPIVACAINE-EPINEPHRINE (PF) 0.25% -1:200000 IJ SOLN
INTRAMUSCULAR | Status: DC | PRN
  Administered 2021-03-09: 30 mL

## 2021-03-09 MED ORDER — OXYCODONE HCL 5 MG PO TABS
5.0000 mg | ORAL_TABLET | Freq: Once | ORAL | Status: AC | PRN
  Administered 2021-03-09: 5 mg via ORAL

## 2021-03-09 MED ORDER — ONDANSETRON HCL 4 MG/2ML IJ SOLN: 4.0000 mg | Freq: Once | INTRAMUSCULAR | Status: DC | PRN

## 2021-03-09 MED ORDER — PROPOFOL 1000 MG/100ML IV EMUL
INTRAVENOUS | Status: AC
  Filled 2021-03-09: qty 100

## 2021-03-09 MED ORDER — LIDOCAINE 2% (20 MG/ML) 5 ML SYRINGE
INTRAMUSCULAR | Status: DC | PRN
  Administered 2021-03-09: 40 mg via INTRAVENOUS

## 2021-03-09 MED ORDER — FENTANYL CITRATE (PF) 100 MCG/2ML IJ SOLN
INTRAMUSCULAR | Status: DC | PRN
  Administered 2021-03-09 (×2): 50 ug via INTRAVENOUS

## 2021-03-09 MED ORDER — PHENYLEPHRINE HCL (PRESSORS) 10 MG/ML IV SOLN
INTRAVENOUS | Status: AC
  Filled 2021-03-09: qty 1

## 2021-03-09 MED ORDER — BUPIVACAINE LIPOSOME 1.3 % IJ SUSP
INTRAMUSCULAR | Status: DC | PRN
  Administered 2021-03-09: 10 mL

## 2021-03-09 MED ORDER — ONDANSETRON HCL 4 MG/2ML IJ SOLN
INTRAMUSCULAR | Status: AC
  Filled 2021-03-09: qty 2

## 2021-03-09 MED ORDER — TRANEXAMIC ACID-NACL 1000-0.7 MG/100ML-% IV SOLN: 1000.0000 mg | Freq: Once | INTRAVENOUS | Status: DC

## 2021-03-09 MED ORDER — METHOCARBAMOL 500 MG PO TABS: 500.0000 mg | ORAL_TABLET | Freq: Four times a day (QID) | ORAL | Status: DC | PRN

## 2021-03-09 MED ORDER — FENTANYL CITRATE (PF) 100 MCG/2ML IJ SOLN
INTRAMUSCULAR | Status: AC
  Filled 2021-03-09: qty 2

## 2021-03-09 MED ORDER — METHOCARBAMOL 500 MG IVPB - SIMPLE MED
INTRAVENOUS | Status: AC
  Filled 2021-03-09: qty 50

## 2021-03-09 MED ORDER — LACTATED RINGERS IV BOLUS
250.0000 mL | Freq: Once | INTRAVENOUS | Status: AC
  Administered 2021-03-09: 250 mL via INTRAVENOUS

## 2021-03-09 MED ORDER — MIDAZOLAM HCL 2 MG/2ML IJ SOLN
INTRAMUSCULAR | Status: DC | PRN
  Administered 2021-03-09: 2 mg via INTRAVENOUS

## 2021-03-09 MED ORDER — PROPOFOL 500 MG/50ML IV EMUL
INTRAVENOUS | Status: DC | PRN
  Administered 2021-03-09: 75 ug/kg/min via INTRAVENOUS

## 2021-03-09 MED ORDER — VANCOMYCIN HCL 1000 MG IV SOLR
INTRAVENOUS | Status: AC
  Filled 2021-03-09: qty 2000

## 2021-03-09 MED ORDER — METHOCARBAMOL 500 MG IVPB - SIMPLE MED
500.0000 mg | Freq: Four times a day (QID) | INTRAVENOUS | Status: DC | PRN
  Administered 2021-03-09: 500 mg via INTRAVENOUS

## 2021-03-09 MED ORDER — LACTATED RINGERS IV SOLN: INTRAVENOUS | Status: DC

## 2021-03-09 MED ORDER — MIDAZOLAM HCL 2 MG/2ML IJ SOLN
INTRAMUSCULAR | Status: AC
  Filled 2021-03-09: qty 2

## 2021-03-09 MED ORDER — 0.9 % SODIUM CHLORIDE (POUR BTL) OPTIME
TOPICAL | Status: DC | PRN
  Administered 2021-03-09: 1000 mL

## 2021-03-09 MED ORDER — PHENYLEPHRINE HCL-NACL 10-0.9 MG/250ML-% IV SOLN
INTRAVENOUS | Status: DC | PRN
  Administered 2021-03-09: 40 ug/min via INTRAVENOUS

## 2021-03-09 MED ORDER — PROPOFOL 10 MG/ML IV BOLUS
INTRAVENOUS | Status: DC | PRN
  Administered 2021-03-09: 20 mg via INTRAVENOUS

## 2021-03-09 MED ORDER — HYDROMORPHONE HCL 1 MG/ML IJ SOLN: 0.2500 mg | INTRAMUSCULAR | Status: DC | PRN

## 2021-03-09 MED ORDER — TIZANIDINE HCL 4 MG PO TABS: 4.0000 mg | ORAL_TABLET | Freq: Four times a day (QID) | ORAL | 1 refills | Status: AC | PRN

## 2021-03-09 MED ORDER — POVIDONE-IODINE 10 % EX SWAB
2.0000 "application " | Freq: Once | CUTANEOUS | Status: AC
  Administered 2021-03-09: 2 via TOPICAL

## 2021-03-09 MED ORDER — PHENYLEPHRINE 40 MCG/ML (10ML) SYRINGE FOR IV PUSH (FOR BLOOD PRESSURE SUPPORT)
PREFILLED_SYRINGE | INTRAVENOUS | Status: DC | PRN
  Administered 2021-03-09 (×2): 80 ug via INTRAVENOUS
  Administered 2021-03-09: 40 ug via INTRAVENOUS
  Administered 2021-03-09: 80 ug via INTRAVENOUS

## 2021-03-09 MED ORDER — BUPIVACAINE-EPINEPHRINE (PF) 0.25% -1:200000 IJ SOLN
INTRAMUSCULAR | Status: AC
  Filled 2021-03-09: qty 30

## 2021-03-09 MED ORDER — BUPIVACAINE IN DEXTROSE 0.75-8.25 % IT SOLN
INTRATHECAL | Status: DC | PRN
  Administered 2021-03-09: 12.75 mg via INTRATHECAL

## 2021-03-09 MED ORDER — ACETAMINOPHEN 10 MG/ML IV SOLN: 1000.0000 mg | Freq: Once | INTRAVENOUS | Status: DC | PRN

## 2021-03-09 MED ORDER — PHENYLEPHRINE 40 MCG/ML (10ML) SYRINGE FOR IV PUSH (FOR BLOOD PRESSURE SUPPORT)
PREFILLED_SYRINGE | INTRAVENOUS | Status: AC
  Filled 2021-03-09: qty 10

## 2021-03-09 MED ORDER — DROPERIDOL 2.5 MG/ML IJ SOLN: 0.6250 mg | Freq: Once | INTRAMUSCULAR | Status: DC | PRN

## 2021-03-09 MED ORDER — EPHEDRINE 5 MG/ML INJ
INTRAVENOUS | Status: AC
  Filled 2021-03-09: qty 10

## 2021-03-09 MED ORDER — LACTATED RINGERS IV BOLUS
500.0000 mL | Freq: Once | INTRAVENOUS | Status: AC
  Administered 2021-03-09: 500 mL via INTRAVENOUS

## 2021-03-09 MED ORDER — LIDOCAINE 2% (20 MG/ML) 5 ML SYRINGE
INTRAMUSCULAR | Status: AC
  Filled 2021-03-09: qty 5

## 2021-03-09 MED ORDER — OXYCODONE HCL 5 MG PO TABS
ORAL_TABLET | ORAL | Status: AC
  Filled 2021-03-09: qty 1

## 2021-03-09 MED ORDER — DEXAMETHASONE SODIUM PHOSPHATE 10 MG/ML IJ SOLN
INTRAMUSCULAR | Status: AC
  Filled 2021-03-09: qty 1

## 2021-03-09 MED ORDER — ORAL CARE MOUTH RINSE: 15.0000 mL | Freq: Once | OROMUCOSAL | Status: AC

## 2021-03-09 MED ORDER — CHLORHEXIDINE GLUCONATE 0.12 % MT SOLN
15.0000 mL | Freq: Once | OROMUCOSAL | Status: AC
  Administered 2021-03-09: 15 mL via OROMUCOSAL

## 2021-03-09 MED ORDER — OXYCODONE-ACETAMINOPHEN 5-325 MG PO TABS: 1.0000 | ORAL_TABLET | Freq: Four times a day (QID) | ORAL | 0 refills | Status: AC | PRN

## 2021-03-09 MED ORDER — VANCOMYCIN HCL IN DEXTROSE 1-5 GM/200ML-% IV SOLN
1000.0000 mg | INTRAVENOUS | Status: AC
  Administered 2021-03-09: 1000 mg via INTRAVENOUS
  Filled 2021-03-09: qty 200

## 2021-03-09 MED ORDER — ONDANSETRON HCL 4 MG/2ML IJ SOLN
INTRAMUSCULAR | Status: DC | PRN
  Administered 2021-03-09: 4 mg via INTRAVENOUS

## 2021-03-09 SURGICAL SUPPLY — 41 items
BAG DECANTER FOR FLEXI CONT (MISCELLANEOUS) ×2 IMPLANT
BLADE SAW SGTL 18X1.27X75 (BLADE) ×2 IMPLANT
BOOTIES KNEE HIGH SLOAN (MISCELLANEOUS) ×2 IMPLANT
CELLS DAT CNTRL 66122 CELL SVR (MISCELLANEOUS) ×1 IMPLANT
COVER PERINEAL POST (MISCELLANEOUS) ×2 IMPLANT
COVER SURGICAL LIGHT HANDLE (MISCELLANEOUS) ×2 IMPLANT
CUP PINN GRIPTON 54 100 (Cup) ×2 IMPLANT
DECANTER SPIKE VIAL GLASS SM (MISCELLANEOUS) ×2 IMPLANT
DRAPE IMP U-DRAPE 54X76 (DRAPES) ×2 IMPLANT
DRAPE ORTHO SPLIT 77X108 STRL (DRAPES)
DRAPE STERI IOBAN 125X83 (DRAPES) ×2 IMPLANT
DRAPE SURG ORHT 6 SPLT 77X108 (DRAPES) IMPLANT
DRAPE U-SHAPE 47X51 STRL (DRAPES) ×4 IMPLANT
DRESSING AQUACEL AG SP 3.5X6 (GAUZE/BANDAGES/DRESSINGS) ×1 IMPLANT
DRSG AQUACEL AG SP 3.5X6 (GAUZE/BANDAGES/DRESSINGS) ×2
DURAPREP 26ML APPLICATOR (WOUND CARE) ×2 IMPLANT
ELECT BLADE TIP CTD 4 INCH (ELECTRODE) ×2 IMPLANT
ELECT REM PT RETURN 15FT ADLT (MISCELLANEOUS) ×2 IMPLANT
ELIMINATOR HOLE APEX DEPUY (Hips) ×2 IMPLANT
GLOVE SRG 8 PF TXTR STRL LF DI (GLOVE) ×2 IMPLANT
GLOVE SURG ENC MOIS LTX SZ8 (GLOVE) ×4 IMPLANT
GLOVE SURG UNDER POLY LF SZ8 (GLOVE) ×4
GOWN STRL REUS W/TWL XL LVL3 (GOWN DISPOSABLE) ×4 IMPLANT
HEAD M SROM 36MM 2 (Hips) ×1 IMPLANT
KIT TURNOVER KIT A (KITS) ×2 IMPLANT
LINER NEUTRAL 54X36MM PLUS 4 (Hips) ×2 IMPLANT
MANIFOLD NEPTUNE II (INSTRUMENTS) ×2 IMPLANT
NEEDLE HYPO 22GX1.5 SAFETY (NEEDLE) ×2 IMPLANT
NS IRRIG 1000ML POUR BTL (IV SOLUTION) ×2 IMPLANT
PACK ANTERIOR HIP CUSTOM (KITS) ×2 IMPLANT
PROTECTOR NERVE ULNAR (MISCELLANEOUS) ×2 IMPLANT
RTRCTR WOUND ALEXIS 18CM MED (MISCELLANEOUS) ×2
SROM M HEAD 36MM 2 (Hips) ×2 IMPLANT
STEM FEMORAL SZ5 HIGH ACTIS (Stem) ×2 IMPLANT
SUT ETHIBOND NAB CT1 #1 30IN (SUTURE) ×4 IMPLANT
SUT VIC AB 1 CT1 36 (SUTURE) ×2 IMPLANT
SUT VIC AB 2-0 CT1 27 (SUTURE) ×2
SUT VIC AB 2-0 CT1 TAPERPNT 27 (SUTURE) ×1 IMPLANT
SUT VICRYL AB 3-0 FS1 BRD 27IN (SUTURE) ×2 IMPLANT
SUT VLOC 180 0 24IN GS25 (SUTURE) ×2 IMPLANT
SYR 50ML LL SCALE MARK (SYRINGE) ×2 IMPLANT

## 2021-03-09 NOTE — Anesthesia Postprocedure Evaluation (Signed)
Anesthesia Post Note  Patient: Chad Turner.  Procedure(s) Performed: LEFT TOTAL HIP ARTHROPLASTY ANTERIOR APPROACH (Left Hip)     Patient location during evaluation: Nursing Unit Anesthesia Type: Spinal Level of consciousness: oriented and awake and alert Pain management: pain level controlled Vital Signs Assessment: post-procedure vital signs reviewed and stable Respiratory status: spontaneous breathing and respiratory function stable Cardiovascular status: blood pressure returned to baseline and stable Postop Assessment: no headache, no backache, no apparent nausea or vomiting and patient able to bend at knees Anesthetic complications: no   No complications documented.  Last Vitals:  Vitals:   03/09/21 1100 03/09/21 1203  BP: (!) 151/92 (!) 149/93  Pulse: 74 85  Resp: 16 16  Temp:    SpO2: 95% 97%    Last Pain:  Vitals:   03/09/21 1203  TempSrc:   PainSc: 4                  [place]

## 2021-03-09 NOTE — Transfer of Care (Signed)
Immediate Anesthesia Transfer of Care Note  Patient: Chad Turner.  Procedure(s) Performed: LEFT TOTAL HIP ARTHROPLASTY ANTERIOR APPROACH (Left Hip)  Patient Location: PACU  Anesthesia Type:Spinal  Level of Consciousness: drowsy and patient cooperative  Airway & Oxygen Therapy: Patient Spontanous Breathing and Patient connected to face mask oxygen  Post-op Assessment: Report given to RN and Post -op Vital signs reviewed and stable  Post vital signs: Reviewed and stable  Last Vitals:  Vitals Value Taken Time  BP 123/80 03/09/21 0922  Temp    Pulse 77 03/09/21 0923  Resp 21 03/09/21 0923  SpO2 99 % 03/09/21 0923  Vitals shown include unvalidated device data.  Last Pain:  Vitals:   03/09/21 0629  TempSrc: Oral  PainSc:          Complications: No complications documented.

## 2021-03-09 NOTE — Evaluation (Signed)
Physical Therapy Evaluation Patient Details Name: Chad Turner. MRN: [id number] DOB: 08-15-1961 Today's Date: 03/09/2021   History of Present Illness  Patient is 60 y.o. male s/p Lt THA anterior approach on 03/09/21 with PMH significant for prostate canacer, HTN, HLD, GERD, depression, OA, anemia, Bil TKA.    Clinical Impression  Chad Wojtaszek. is a 60 y.o. male POD 0 s/p Lt THA. Patient reports independence with mobility at baseline. Patient is now limited by functional impairments (see PT problem list below) and requires min guard/supervision for transfers and gait with RW. Patient was able to ambulate ~25 feet with RW and min guard/assist and completed stair mobility with min guarding and use of bil hand rails. Patient limited this session by bladder incontinence and c/o abdominal discomfort. RN notified and will follow up for additional mobility once bladder control returns. Patient will benefit from continued skilled PT interventions to address impairments and progress towards PLOF. Acute PT will follow to progress mobility and stair training in preparation for safe discharge home.     Follow Up Recommendations Follow surgeon's recommendation for DC plan and follow-up therapies    Equipment Recommendations  Rolling walker with 5" wheels;3in1 (PT)    Recommendations for Other Services       Precautions / Restrictions Precautions Precautions: Fall Restrictions Weight Bearing Restrictions: No Other Position/Activity Restrictions: WBAT      Mobility  Bed Mobility Overal bed mobility: Needs Assistance Bed Mobility: Supine to Sit     Supine to sit: Supervision;HOB elevated     General bed mobility comments: HOB slightly elevated, supervision for safety, no cues needed.    Transfers Overall transfer level: Needs assistance Equipment used: Rolling walker (2 wheeled) Transfers: Sit to/from Stand Sit to Stand: Min guard;Supervision         General transfer  comment: cues for hand placement with RW, pt having trouble with bladder incontinence and holding urinal with Rt hand and RW with Lt.  Ambulation/Gait Ambulation/Gait assistance: Min guard Gait Distance (Feet): 25 Feet Assistive device: Rolling walker (2 wheeled) Gait Pattern/deviations: Step-to pattern;Decreased stride length;Decreased weight shift to left Gait velocity: decr   General Gait Details: cues for safe step pattern/proximity to RW. pt with trouble with bladder incontinence and distance limited. guarding for safety.  Stairs Stairs: Yes Stairs assistance: Min guard Stair Management: Two rails;Step to pattern;Forwards Number of Stairs: 3 General stair comments: cues for safe sequencing "up with good, down with bad" no overt LOB. pt verbalized safe understanding of guarding/assist from family to enter home.  Wheelchair Mobility    Modified Rankin (Stroke Patients Only)       Balance Overall balance assessment: Needs assistance Sitting-balance support: Feet supported Sitting balance-Leahy Scale: Fair     Standing balance support: During functional activity;Bilateral upper extremity supported Standing balance-Leahy Scale: Fair                               Pertinent Vitals/Pain Pain Assessment: Faces Faces Pain Scale: Hurts little more Pain Location: Lt hip Pain Descriptors / Indicators: Aching;Discomfort Pain Intervention(s): Limited activity within patient's tolerance;Monitored during session;Repositioned    Home Living Family/patient expects to be discharged to:: Private residence Living Arrangements: Alone Available Help at Discharge: Friend(s);Family Type of Home: Apartment Home Access: Stairs to enter Entrance Stairs-Rails: Can reach both Entrance Stairs-Number of Steps: 3 Home Layout: One level Home Equipment: Grab bars - tub/shower;Hand held shower head;Cane - single point  Additional Comments: pt's girlfriend can help and his cousin is  staying with him to help    Prior Function Level of Independence: Independent               Hand Dominance   Dominant Hand: Right    Extremity/Trunk Assessment   Upper Extremity Assessment Upper Extremity Assessment: Overall WFL for tasks assessed    Lower Extremity Assessment Lower Extremity Assessment: Overall WFL for tasks assessed    Cervical / Trunk Assessment Cervical / Trunk Assessment: Normal  Communication   Communication: No difficulties  Cognition Arousal/Alertness: Awake/alert Behavior During Therapy: WFL for tasks assessed/performed Overall Cognitive Status: Within Functional Limits for tasks assessed                                        General Comments      Exercises     Assessment/Plan    PT Assessment Patient needs continued PT services  PT Problem List Decreased strength;Decreased range of motion;Decreased activity tolerance;Decreased balance;Decreased mobility;Decreased knowledge of use of DME;Decreased knowledge of precautions;Pain       PT Treatment Interventions DME instruction;Gait training;Stair training;Functional mobility training;Therapeutic activities;Therapeutic exercise;Balance training;Patient/family education    PT Goals (Current goals can be found in the Care Plan section)  Acute Rehab PT Goals Patient Stated Goal: regain independence and get home today PT Goal Formulation: With patient Time For Goal Achievement: 03/16/21 Potential to Achieve Goals: Good    Frequency 7X/week   Barriers to discharge        Co-evaluation               AM-PAC PT "6 Clicks" Mobility  Outcome Measure Help needed turning from your back to your side while in a flat bed without using bedrails?: None Help needed moving from lying on your back to sitting on the side of a flat bed without using bedrails?: A Little Help needed moving to and from a bed to a chair (including a wheelchair)?: A Little Help needed standing  up from a chair using your arms (e.g., wheelchair or bedside chair)?: A Little Help needed to walk in hospital room?: A Little Help needed climbing 3-5 steps with a railing? : A Little 6 Click Score: 19    End of Session Equipment Utilized During Treatment: Gait belt Activity Tolerance: Patient tolerated treatment well Patient left: in chair;with call bell/phone within reach;with family/visitor present Nurse Communication: Mobility status PT Visit Diagnosis: Muscle weakness (generalized) (M62.81);Difficulty in walking, not elsewhere classified (R26.2)    Time: 1157-2620 PT Time Calculation (min) (ACUTE ONLY): 23 min   Charges:   PT Evaluation $PT Eval Low Complexity: 1 Low PT Treatments $Gait Training: 8-22 mins        Chad Turner, DPT Acute Rehabilitation Services Office [phone] Pager [phone]    Chad Turner 03/09/2021, 1:21 PM

## 2021-03-09 NOTE — Op Note (Signed)
PRE-OP DIAGNOSIS:  LEFT HIP DEGENERATIVE JOINT DISEASE POST-OP DIAGNOSIS: same PROCEDURE:  LEFT TOTAL HIP ARTHROPLASTY ANTERIOR APPROACH ANESTHESIA:  Spinal and MAC SURGEON:  Melrose Nakayama MD ASSISTANT:  Loni Dolly PA-C   INDICATIONS FOR PROCEDURE:  The patient is a 60 y.o. male with a long history of a painful hip.  This has persisted despite multiple conservative measures.  The patient has persisted with pain and dysfunction making rest and activity difficult.  A total hip replacement is offered as surgical treatment.  Informed operative consent was obtained after discussion of possible complications including reaction to anesthesia, infection, neurovascular injury, dislocation, DVT, PE, and death.  The importance of the postoperative rehab program to optimize result was stressed with the patient.  SUMMARY OF FINDINGS AND PROCEDURE:  Under the above anesthesia through a anterior approach an the Hana table a left THR was performed.  The patient had severe degenerative change and excellent bone quality.  We used DePuy components to replace the hip and these were size 5 high offset Actis femur capped with a -2 29m metal hip ball.  On the acetabular side we used a size 54 Gription shell with a plus 4  polyethylene liner.  We did use a hole eliminator.  ALoni DollyPA-C assisted throughout and was invaluable to the completion of the case in that he helped position and retract while I performed the procedure.  He also closed simultaneously to help minimize OR time.  I used fluoroscopy throughout the case to check position of components and leg lengths and read all these views myself.  DESCRIPTION OF PROCEDURE:  The patient was taken to the OR suite where the above anesthetic was applied.  The patient was then positioned on the Hana table supine.  All bony prominences were appropriately padded.  Prep and drape was then performed in normal sterile fashion.  The patient was given vancomycin preoperative  antibiotic and an appropriate time out was performed.  We then took an anterior approach to the left hip.  Dissection was taken through adipose to the tensor fascia lata fascia.  This structure was incised longitudinally and we dissected in the intermuscular interval just medial to this muscle.  Cobra retractors were placed superior and inferior to the femoral neck superficial to the capsule.  A capsular incision was then made and the retractors were placed along the femoral neck.  Xray was brought in to get a good level for the femoral neck cut which was made with an oscillating saw and osteotome.  The femoral head was removed with a corkscrew.  The acetabulum was exposed and some labral tissues were excised. Reaming was taken to the inside wall of the pelvis and sequentially up to 1 mm smaller than the actual component.  A trial of components was done and then the aforementioned acetabular shell was placed in appropriate tilt and anteversion confirmed by fluoroscopy. The liner was placed along with the hole eliminator and attention was turned to the femur.  The leg was brought down and over into adduction and the elevator bar was used to raise the femur up gently in the wound.  The piriformis was released with care taken to preserve the obturator internus attachment and all of the posterior capsule. The femur was reamed and then broached to the appropriate size.  A trial reduction was done and the aforementioned head and neck assembly gave u[place] best stability in extension with external rotation.  Leg lengths were felt to be about equal  by fluoroscopic exam.  The trial components were removed and the wound irrigated.  We then placed the femoral component in appropriate anteversion.  The head was applied to a dry stem neck and the hip again reduced.  It was again stable in the aforementioned position.  The would was irrigated again followed by re-approximation of anterior capsule with ethibond suture. Tensor  fascia was repaired with V-loc suture  followed by deep closure with #O and #2 undyed vicryl.  Skin was closed with subQ stitch and steristrips followed by a sterile dressing.  EBL and IOF can be obtained from anesthesia records.  DISPOSITION:  The patient was extubated in the OR and taken to PACU in stable condition to be admitted to the Orthopedic Surgery for appropriate post-op care to include perioperative antibiotics and DVT prophylaxis.

## 2021-03-09 NOTE — Anesthesia Procedure Notes (Signed)
Procedure Name: MAC Date/Time: 03/09/2021 7:32 AM Performed by: Eben Burow, CRNA Pre-anesthesia Checklist: Patient identified, Emergency Drugs available, Suction available, Patient being monitored and Timeout performed Oxygen Delivery Method: Simple face mask Placement Confirmation: positive ETCO2

## 2021-03-09 NOTE — Anesthesia Procedure Notes (Signed)
Spinal  Patient location during procedure: OB Start time: 03/09/2021 7:29 AM End time: 03/09/2021 7:33 AM Reason for block: surgical anesthesia Staffing Performed: anesthesiologist  Anesthesiologist: Barnet Glasgow, MD Preanesthetic Checklist Completed: patient identified, IV checked, risks and benefits discussed, surgical consent, monitors and equipment checked, pre-op evaluation and timeout performed Spinal Block Patient position: sitting Prep: DuraPrep and site prepped and draped Patient monitoring: heart rate, cardiac monitor, continuous pulse ox and blood pressure Approach: midline Location: L3-4 Injection technique: single-shot Needle Needle type: Pencan  Needle gauge: 24 G Needle length: 10 cm Needle insertion depth: 8 cm Assessment Sensory level: T4 Events: CSF return Additional Notes 1 Attempt (s). Pt tolerated procedure well.

## 2021-03-09 NOTE — Interval H&P Note (Signed)
History and Physical Interval Note:  03/09/2021 7:25 AM  Chad Turner.  has presented today for surgery, with the diagnosis of LEFT HIP DEGENERATIVE JOINT DISEASE.  The various methods of treatment have been discussed with the patient and family. After consideration of risks, benefits and other options for treatment, the patient has consented to  Procedure(s): LEFT TOTAL HIP ARTHROPLASTY ANTERIOR APPROACH (Left) as a surgical intervention.  The patient's history has been reviewed, patient examined, no change in status, stable for surgery.  I have reviewed the patient's chart and labs.  Questions were answered to the patient's satisfaction.     [name]

## 2021-03-09 NOTE — Progress Notes (Signed)
Physical Therapy Treatment Patient Details Name: Chad Turner. MRN: [id number] DOB: Apr 08, 1961 Today's Date: 03/09/2021    History of Present Illness Patient is 60 y.o. male s/p Lt THA anterior approach on 03/09/21 with PMH significant for prostate canacer, HTN, HLD, GERD, depression, OA, anemia, Bil TKA.    PT Comments    Patient making good progress with mobility. He advanced gait distance to ~100' and demonstrated safe step pattern and management of RW. He verbalized safe guarding position and assist for family/friends to provide with stair mobility. Initiated HEP for ROM and strengthening and educated on heel pumps to facilitate circulation for prevention of DVT. Patient educated on importance of mobilizing with RW initially to manage pain and protect hip and verbalized understanding. Patient is mobilizing at safe level for discharge home with assist from therapy. Acute PT will progress during stay.   Follow Up Recommendations  Follow surgeon's recommendation for DC plan and follow-up therapies     Equipment Recommendations  Rolling walker with 5" wheels;3in1 (PT)    Recommendations for Other Services       Precautions / Restrictions Precautions Precautions: Fall Restrictions Weight Bearing Restrictions: No Other Position/Activity Restrictions: WBAT    Mobility  Bed Mobility                    Transfers Overall transfer level: Needs assistance Equipment used: Rolling walker (2 wheeled) Transfers: Sit to/from Stand Sit to Stand: Supervision         General transfer comment: Cues for safety as pt had attempted to stand and walk with no support to get to the urinal. Patient with good recall for safe hand placement for powerup to RW and safe reach back to sit in recliner.  Ambulation/Gait Ambulation/Gait assistance: Supervision;Min guard Gait Distance (Feet): 100 Feet Assistive device: Rolling walker (2 wheeled) Gait Pattern/deviations: Decreased stride  length;Decreased weight shift to left;Step-through pattern;Step-to pattern Gait velocity: decr   General Gait Details: min guard progressing to supervision for safety with gait. pt maintained safe proximity to RW and progressed to step through pattern.   Stairs             Wheelchair Mobility    Modified Rankin (Stroke Patients Only)       Balance Overall balance assessment: Needs assistance Sitting-balance support: Feet supported Sitting balance-Leahy Scale: Fair     Standing balance support: During functional activity;Bilateral upper extremity supported Standing balance-Leahy Scale: Fair                              Cognition Arousal/Alertness: Awake/alert Behavior During Therapy: WFL for tasks assessed/performed Overall Cognitive Status: Within Functional Limits for tasks assessed                                        Exercises Total Joint Exercises Ankle Circles/Pumps: AROM;Both;20 reps;Seated Quad Sets: AROM;Left;10 reps;Seated Heel Slides: AROM;Left;5 reps;Seated Hip ABduction/ADduction: AROM;Left;5 reps;Seated    General Comments        Pertinent Vitals/Pain Pain Assessment: Faces Faces Pain Scale: Hurts little more Pain Location: Lt hip Pain Descriptors / Indicators: Aching;Discomfort Pain Intervention(s): Limited activity within patient's tolerance;Monitored during session;Repositioned    Home Living                      Prior Function  PT Goals (current goals can now be found in the care plan section) Acute Rehab PT Goals Patient Stated Goal: regain independence and get home today PT Goal Formulation: With patient Time For Goal Achievement: 03/16/21 Potential to Achieve Goals: Good Progress towards PT goals: Progressing toward goals    Frequency    7X/week      PT Plan Current plan remains appropriate    Co-evaluation              AM-PAC PT "6 Clicks" Mobility   Outcome  Measure  Help needed turning from your back to your side while in a flat bed without using bedrails?: None Help needed moving from lying on your back to sitting on the side of a flat bed without using bedrails?: A Little Help needed moving to and from a bed to a chair (including a wheelchair)?: A Little Help needed standing up from a chair using your arms (e.g., wheelchair or bedside chair)?: A Little Help needed to walk in hospital room?: A Little Help needed climbing 3-5 steps with a railing? : A Little 6 Click Score: 19    End of Session Equipment Utilized During Treatment: Gait belt Activity Tolerance: Patient tolerated treatment well Patient left: in chair;with call bell/phone within reach;with family/visitor present Nurse Communication: Mobility status PT Visit Diagnosis: Muscle weakness (generalized) (M62.81);Difficulty in walking, not elsewhere classified (R26.2)     Time: 5366-4403 PT Time Calculation (min) (ACUTE ONLY): 26 min  Charges:  $Gait Training: 8-22 mins $Therapeutic Exercise: 8-22 mins                     Chad Turner, DPT Acute Rehabilitation Services Office [phone] Pager [phone]     Chad Turner 03/09/2021, 6:17 PM

## 2021-03-11 ENCOUNTER — Encounter (HOSPITAL_COMMUNITY): Payer: Self-pay | Admitting: Orthopaedic Surgery

## 2021-06-22 ENCOUNTER — Other Ambulatory Visit: Payer: Self-pay | Admitting: Internal Medicine

## 2021-07-06 ENCOUNTER — Telehealth: Payer: Self-pay

## 2021-07-06 DIAGNOSIS — K7030 Alcoholic cirrhosis of liver without ascites: Secondary | ICD-10-CM

## 2021-07-06 NOTE — Telephone Encounter (Signed)
-----   Message from Yevette Edwards, RN sent at 01/01/2021  1:12 PM EST ----- Regarding: [place] - cirrhosis, [place] screening

## 2021-07-06 NOTE — Telephone Encounter (Signed)
RUQ [place] order in epic. Secure staff message sent to radiology schedulers to set up patient's appt. I spoke with patient and he is aware. Pt verbalized understanding and had no concerns at the end of the call.

## 2021-07-13 ENCOUNTER — Ambulatory Visit (HOSPITAL_COMMUNITY)
Admission: RE | Admit: 2021-07-13 | Discharge: 2021-07-13 | Disposition: A | Discharge status: Home / Self Care | Payer: Medicare Other | Source: Ambulatory Visit | Attending: Physician Assistant | Admitting: Physician Assistant

## 2021-07-13 ENCOUNTER — Other Ambulatory Visit: Payer: Self-pay

## 2021-07-13 DIAGNOSIS — K7030 Alcoholic cirrhosis of liver without ascites: Secondary | ICD-10-CM | POA: Diagnosis not present

## 2021-11-04 HISTORY — PX: HERNIA REPAIR: SHX51

## 2021-11-12 ENCOUNTER — Emergency Department (HOSPITAL_BASED_OUTPATIENT_CLINIC_OR_DEPARTMENT_OTHER)
Admission: EM | Admit: 2021-11-12 | Discharge: 2021-11-13 | Disposition: A | Discharge status: Home / Self Care | Payer: Medicare Other | Attending: Emergency Medicine | Admitting: Emergency Medicine

## 2021-11-12 ENCOUNTER — Other Ambulatory Visit: Payer: Self-pay

## 2021-11-12 ENCOUNTER — Encounter (HOSPITAL_BASED_OUTPATIENT_CLINIC_OR_DEPARTMENT_OTHER): Payer: Self-pay

## 2021-11-12 DIAGNOSIS — Z8616 Personal history of COVID-19: Secondary | ICD-10-CM | POA: Diagnosis not present

## 2021-11-12 DIAGNOSIS — L7622 Postprocedural hemorrhage and hematoma of skin and subcutaneous tissue following other procedure: Secondary | ICD-10-CM | POA: Insufficient documentation

## 2021-11-12 DIAGNOSIS — Z8546 Personal history of malignant neoplasm of prostate: Secondary | ICD-10-CM | POA: Diagnosis not present

## 2021-11-12 DIAGNOSIS — I10 Essential (primary) hypertension: Secondary | ICD-10-CM | POA: Diagnosis not present

## 2021-11-12 DIAGNOSIS — T148XXA Other injury of unspecified body region, initial encounter: Secondary | ICD-10-CM

## 2021-11-12 NOTE — ED Triage Notes (Signed)
Pt had a hernia repair on 1/5. Today pt's surgical site dehisced and started bleeding. Pt was instructed to place a bandage over it by his surgeon, but pt noted that it bleed through the first bandage.

## 2021-11-13 MED ORDER — ONDANSETRON 4 MG PO TBDP: 4.0000 mg | ORAL_TABLET | Freq: Three times a day (TID) | ORAL | 0 refills | Status: DC | PRN

## 2021-11-13 NOTE — Discharge Instructions (Signed)
Your surgical wound appears closed.  I suspect that a small amount of surgical glue came off and may have dislodged some clot causing bleeding earlier.  Please keep the area covered.  I have provided dressing supplies here in the emergency department.  If you develop fever, worsening pain, inability to pass gas or have a bowel movement you should return to the emergency department.  Otherwise, please follow closely with your surgery team at [facility].
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
## 2021-11-13 NOTE — ED Notes (Signed)
EMT-P provided AVS using Teachback Method. Patient verbalizes understanding of Discharge Instructions. Opportunity for Questioning and Answers were provided by EMT-P. Patient Discharged from ED.  ? ?

## 2021-12-21 ENCOUNTER — Encounter: Payer: Self-pay | Admitting: Internal Medicine

## 2022-01-11 ENCOUNTER — Telehealth: Payer: Self-pay

## 2022-01-11 DIAGNOSIS — K7030 Alcoholic cirrhosis of liver without ascites: Secondary | ICD-10-CM

## 2022-01-11 NOTE — Telephone Encounter (Signed)
-----   Message from Yevette Edwards, RN sent at 07/14/2021 11:31 AM EDT ----- ?Regarding: [place] ?Routine RUQ [place] - Cirrhosis, HCC screening ? ?

## 2022-01-11 NOTE — Telephone Encounter (Signed)
Called and spoke with patient to remind him that he is due for his routine [place] at this time. Pt is aware that we have placed the order and radiology scheduling will be contacting him within the next couple of weeks to set up his appt. I provided pt with the phone number to radiology scheduling in case he does not hear from them. Pt verbalized understanding and had no concerns at the end of the call. ? ?RUQ [place] order in epic. Secure staff message sent to radiology scheduling to set up appt. ?

## 2022-01-18 ENCOUNTER — Ambulatory Visit (HOSPITAL_COMMUNITY): Admission: RE | Admit: 2022-01-18 | Payer: Medicare Other | Source: Ambulatory Visit

## 2022-01-18 ENCOUNTER — Telehealth: Payer: Self-pay | Admitting: Internal Medicine

## 2022-01-18 NOTE — Telephone Encounter (Signed)
Patient called to reschedule the [place] for today. ?

## 2022-01-18 NOTE — Telephone Encounter (Signed)
Patient given the phone number [phone] to call and reschedule his [place]. ?

## 2022-01-19 ENCOUNTER — Ambulatory Visit (HOSPITAL_COMMUNITY)
Admission: RE | Admit: 2022-01-19 | Discharge: 2022-01-19 | Disposition: A | Discharge status: Home / Self Care | Payer: Medicare Other | Source: Ambulatory Visit | Attending: Physician Assistant | Admitting: Physician Assistant

## 2022-01-19 ENCOUNTER — Other Ambulatory Visit: Payer: Self-pay

## 2022-01-19 DIAGNOSIS — K7030 Alcoholic cirrhosis of liver without ascites: Secondary | ICD-10-CM | POA: Diagnosis not present

## 2022-02-10 ENCOUNTER — Ambulatory Visit: Payer: Medicare Other | Admitting: Podiatry

## 2022-02-16 ENCOUNTER — Ambulatory Visit (INDEPENDENT_AMBULATORY_CARE_PROVIDER_SITE_OTHER): Payer: Medicare Other

## 2022-02-16 ENCOUNTER — Ambulatory Visit (INDEPENDENT_AMBULATORY_CARE_PROVIDER_SITE_OTHER): Payer: Medicare Other | Admitting: Podiatry

## 2022-02-16 DIAGNOSIS — M778 Other enthesopathies, not elsewhere classified: Secondary | ICD-10-CM | POA: Diagnosis not present

## 2022-02-16 DIAGNOSIS — M1000 Idiopathic gout, unspecified site: Secondary | ICD-10-CM

## 2022-02-16 DIAGNOSIS — M7750 Other enthesopathy of unspecified foot: Secondary | ICD-10-CM

## 2022-02-16 DIAGNOSIS — M7751 Other enthesopathy of right foot: Secondary | ICD-10-CM | POA: Diagnosis not present

## 2022-02-16 MED ORDER — METHYLPREDNISOLONE 4 MG PO TBPK: ORAL_TABLET | ORAL | 0 refills | Status: DC
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
## 2022-02-17 LAB — CBC WITH DIFFERENTIAL/PLATELET
Absolute Monocytes: 805 cells/uL (ref 200–950)
Basophils Absolute: 49 cells/uL (ref 0–200)
Basophils Relative: 0.8 %
Eosinophils Absolute: 171 cells/uL (ref 15–500)
Eosinophils Relative: 2.8 %
HCT: 41.1 % (ref 38.5–50.0)
Hemoglobin: 13.2 g/dL (ref 13.2–17.1)
Lymphs Abs: 1885 cells/uL (ref 850–3900)
MCH: 27.3 pg (ref 27.0–33.0)
MCHC: 32.1 g/dL (ref 32.0–36.0)
MCV: 85.1 fL (ref 80.0–100.0)
MPV: 10.2 fL (ref 7.5–12.5)
Monocytes Relative: 13.2 %
Neutro Abs: 3190 cells/uL (ref 1500–7800)
Neutrophils Relative %: 52.3 %
Platelets: 308 10*3/uL (ref 140–400)
RBC: 4.83 10*6/uL (ref 4.20–5.80)
RDW: 12.4 % (ref 11.0–15.0)
Total Lymphocyte: 30.9 %
WBC: 6.1 10*3/uL (ref 3.8–10.8)

## 2022-02-17 LAB — BASIC METABOLIC PANEL
BUN/Creatinine Ratio: 5 (calc) — ABNORMAL LOW (ref 6–22)
BUN: 4 mg/dL — ABNORMAL LOW (ref 7–25)
CO2: 24 mmol/L (ref 20–32)
Calcium: 9.2 mg/dL (ref 8.6–10.3)
Chloride: 96 mmol/L — ABNORMAL LOW (ref 98–110)
Creat: 0.74 mg/dL (ref 0.70–1.35)
Glucose, Bld: 151 mg/dL — ABNORMAL HIGH (ref 65–99)
Potassium: 3.6 mmol/L (ref 3.5–5.3)
Sodium: 133 mmol/L — ABNORMAL LOW (ref 135–146)

## 2022-02-17 LAB — SEDIMENTATION RATE: Sed Rate: 38 mm/h — ABNORMAL HIGH (ref 0–20)

## 2022-02-17 LAB — C-REACTIVE PROTEIN: CRP: 12.2 mg/L — ABNORMAL HIGH (ref <8.0)

## 2022-02-17 LAB — URIC ACID: Uric Acid, Serum: 6.3 mg/dL (ref 4.0–8.0)

## 2022-03-18 ENCOUNTER — Ambulatory Visit: Payer: Medicare Other | Admitting: Podiatry

## 2022-03-25 ENCOUNTER — Ambulatory Visit: Payer: Medicare Other | Admitting: Podiatry

## 2022-04-13 ENCOUNTER — Other Ambulatory Visit: Payer: Self-pay | Admitting: Family

## 2022-04-13 ENCOUNTER — Ambulatory Visit
Admission: RE | Admit: 2022-04-13 | Discharge: 2022-04-13 | Disposition: A | Discharge status: Home / Self Care | Payer: Medicare Other | Source: Ambulatory Visit | Attending: Family | Admitting: Family

## 2022-04-13 DIAGNOSIS — M25511 Pain in right shoulder: Secondary | ICD-10-CM

## 2022-04-18 ENCOUNTER — Ambulatory Visit: Payer: Medicare Other | Admitting: Podiatry

## 2022-04-26 ENCOUNTER — Ambulatory Visit: Payer: Medicare Other | Admitting: Podiatry

## 2022-05-04 IMAGING — US US ABDOMEN LIMITED
1 series · 15 of 25 positions shown · non-contrast
Comparison: Ultrasound dated 07/13/2021.

CLINICAL DATA: Cirrhosis.  HCC screening.

EXAM:
ULTRASOUND ABDOMEN LIMITED RIGHT UPPER QUADRANT

[Series 1: us abdomen limited ruq [facility] · 15 of 54 slices shown]
[im 1/54]
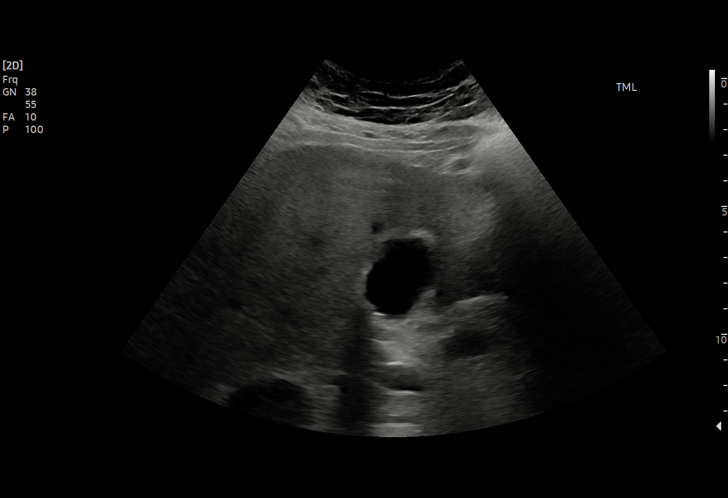
[im 5/54]
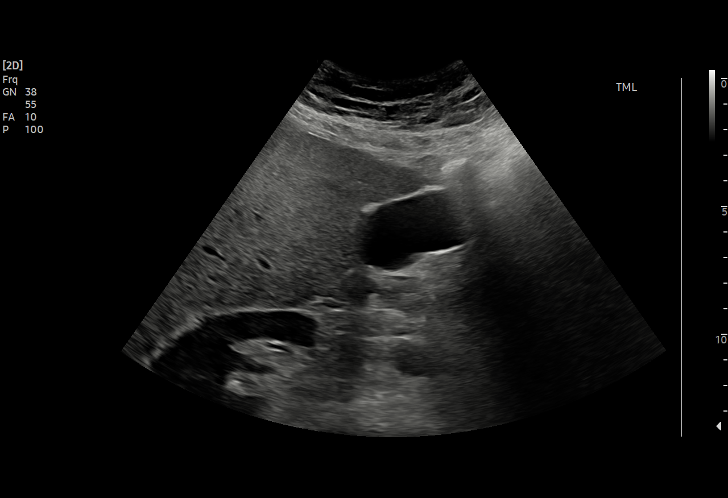
[im 9/54]
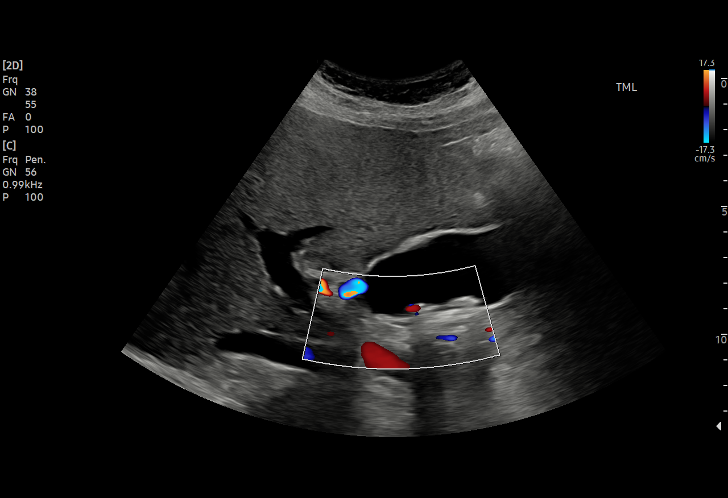
[im 12/54]
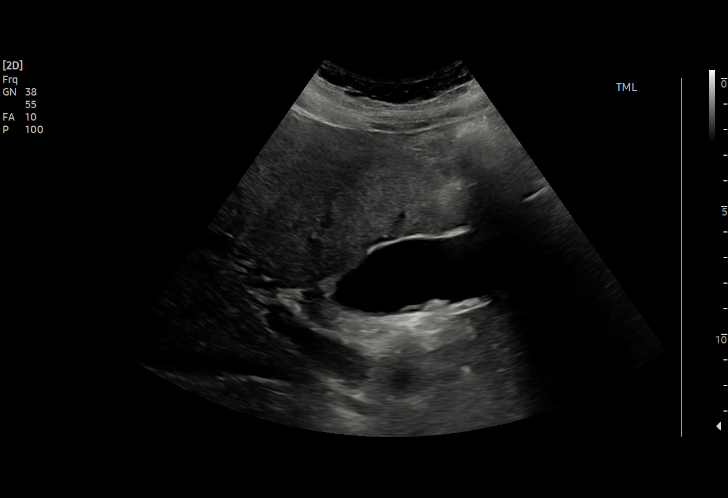
[im 16/54]
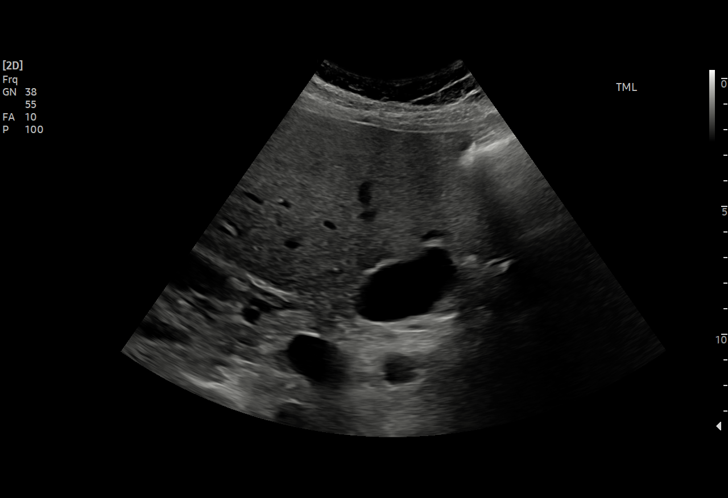
[im 20/54]
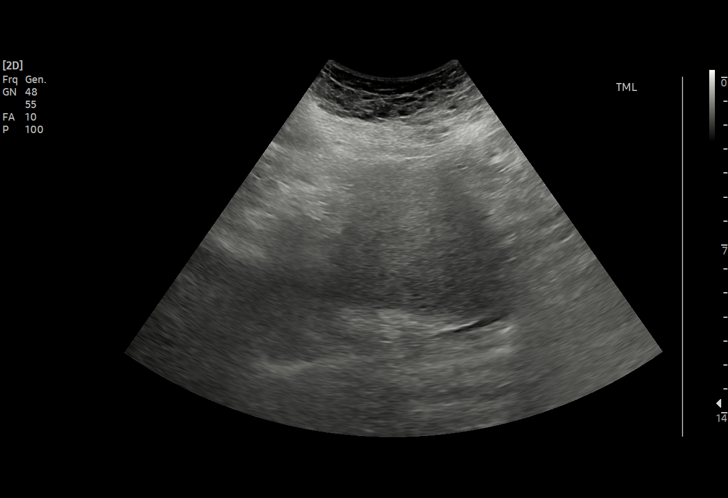
[im 23/54]
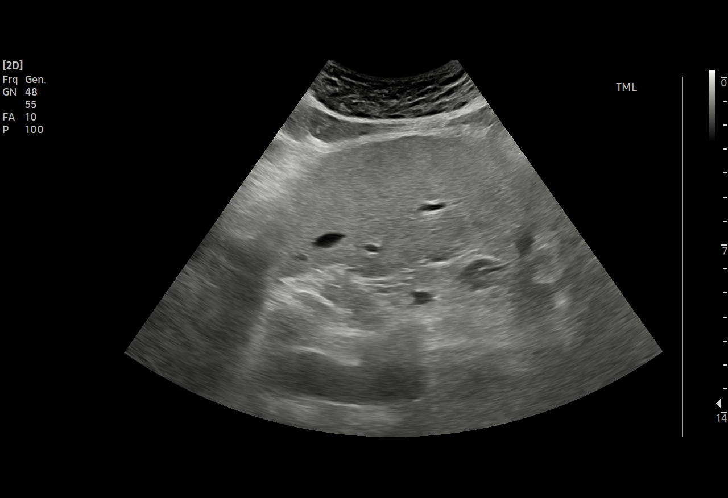
[im 27/54]
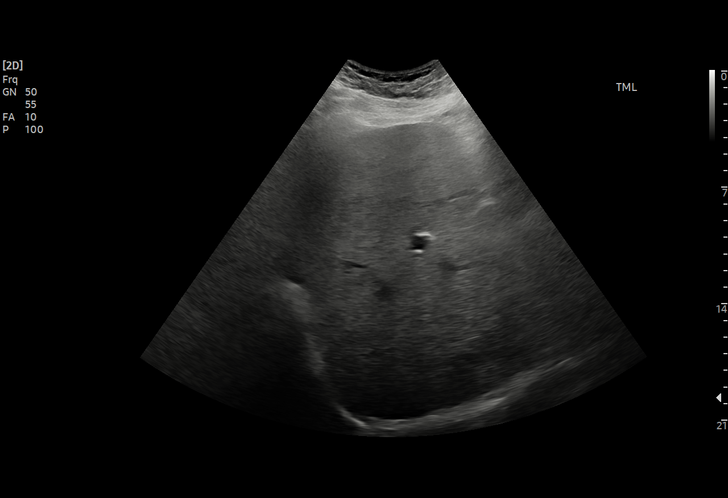
[im 31/54]
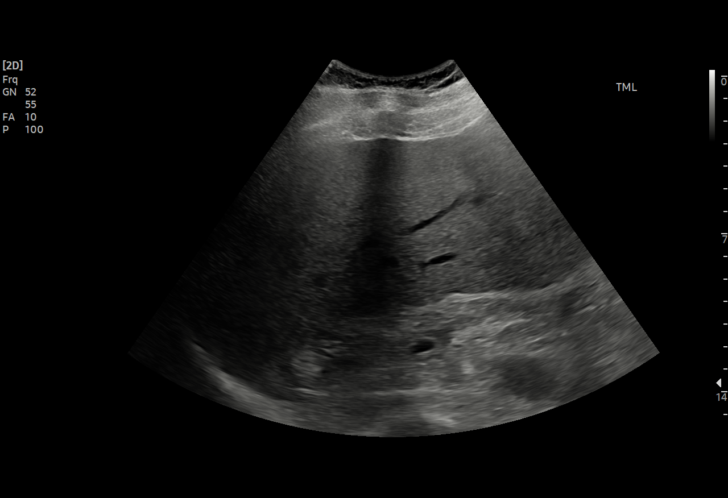
[im 34/54]
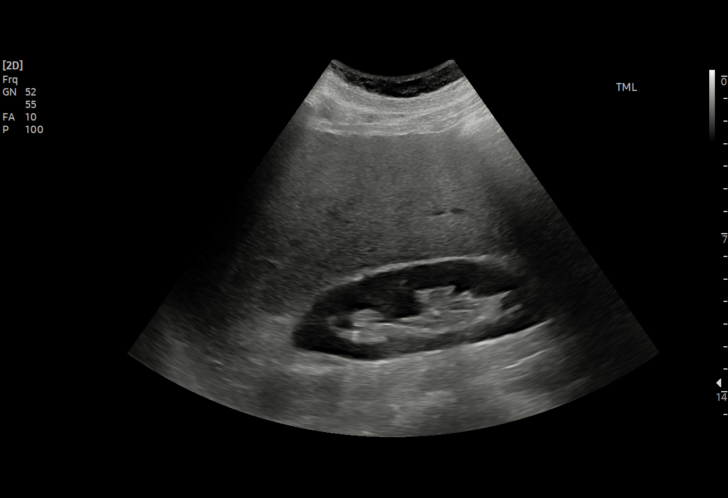
[im 38/54]
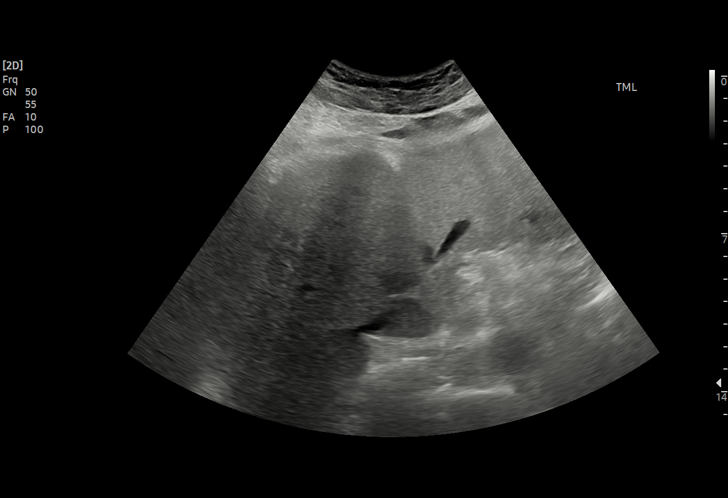
[im 42/54]
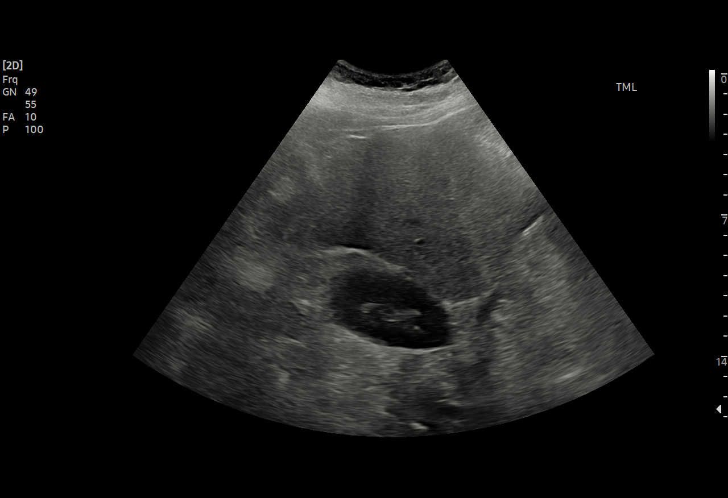
[im 45/54]
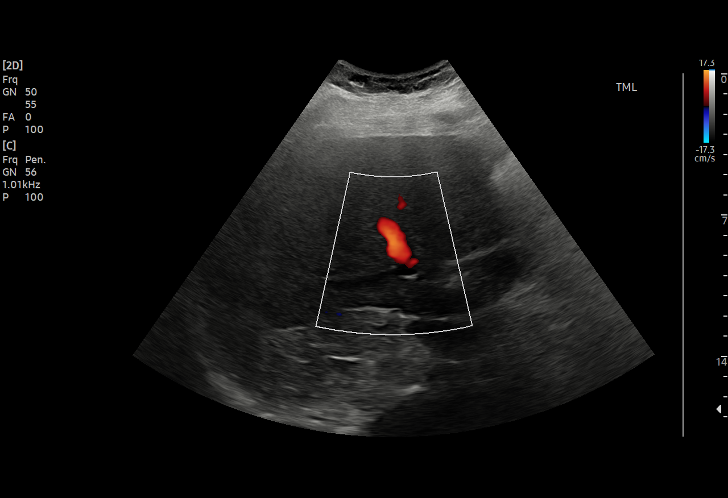
[im 49/54]
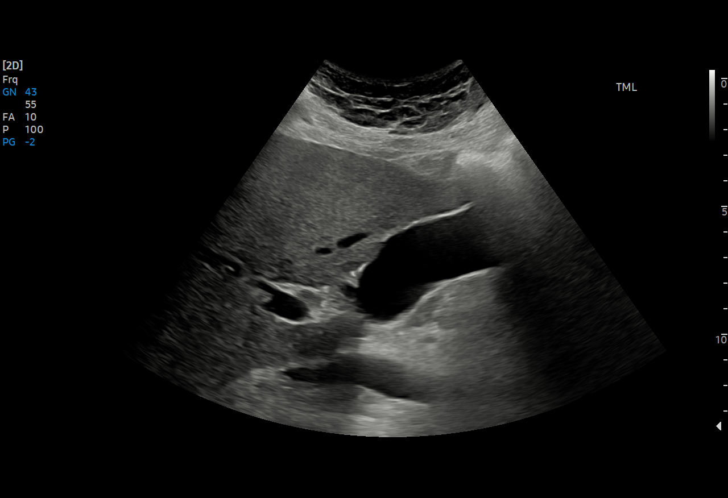
[im 54/54]
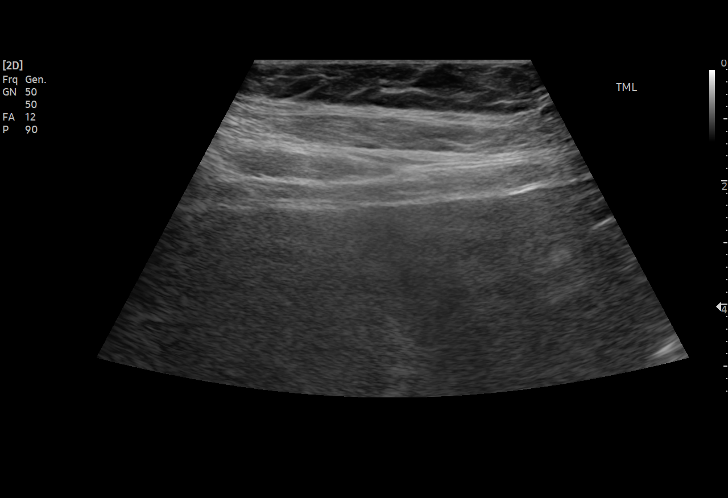

[15 of 25 positions shown; findings below may reference images not displayed]

FINDINGS: Gallbladder:

There is gallstone. No gallbladder wall thickening or
pericholecystic fluid. Negative sonographic Murphy's sign.

Common bile duct:

Diameter: 5 mm

Liver:

There is diffuse increased liver echogenicity with sonographic
features in keeping with known cirrhosis. Portal vein is patent on
color Doppler imaging with normal direction of blood flow towards
the liver.

Other: None.
IMPRESSION: 1. Cirrhosis.
2. Patent main portal vein with hepatopetal flow.
3. Cholelithiasis without sonographic evidence of acute
cholecystitis.

## 2022-07-27 ENCOUNTER — Telehealth: Payer: Self-pay

## 2022-07-27 DIAGNOSIS — K7030 Alcoholic cirrhosis of liver without ascites: Secondary | ICD-10-CM

## 2022-07-27 NOTE — Telephone Encounter (Signed)
-----   Message from Yevette Edwards, RN sent at 01/24/2022 12:54 PM EDT ----- Regarding: [place] RUQ [place] - cirrhosis, [place] screening

## 2022-07-27 NOTE — Telephone Encounter (Signed)
Attempted to reach patient on listed phone # twice. His line goes straight to voicemail and his voicemail is full at this time. Will mail patient a reminder letter.

## 2022-08-19 ENCOUNTER — Ambulatory Visit (HOSPITAL_COMMUNITY)
Admission: RE | Admit: 2022-08-19 | Discharge: 2022-08-19 | Disposition: A | Discharge status: Home / Self Care | Payer: Medicare Other | Source: Ambulatory Visit | Attending: Physician Assistant | Admitting: Physician Assistant

## 2022-08-19 DIAGNOSIS — K7030 Alcoholic cirrhosis of liver without ascites: Secondary | ICD-10-CM | POA: Diagnosis present

## 2023-02-15 ENCOUNTER — Telehealth: Payer: Self-pay

## 2023-02-15 DIAGNOSIS — K7030 Alcoholic cirrhosis of liver without ascites: Secondary | ICD-10-CM

## 2023-02-15 NOTE — Telephone Encounter (Signed)
-----   Message from Missy Sabins, RN sent at 08/19/2022  3:08 PM EDT ----- Regarding: RUQ [place] RUQ [place] - Cirrhosis, HCC screening

## 2023-02-15 NOTE — Telephone Encounter (Signed)
RUQ [place] order in epic. Secure staff message sent to radiology scheduling to contact patient to set up appt.   Called and spoke with patient regarding 49-month repeat RUQ [place]. Pt knows to expect a call from radiology scheduling to set up appt. Pt verbalized understanding and had no concerns at the end of the call.

## 2023-02-17 ENCOUNTER — Encounter: Payer: Self-pay | Admitting: Family

## 2023-02-20 ENCOUNTER — Encounter: Payer: Self-pay | Admitting: Family

## 2023-02-20 ENCOUNTER — Ambulatory Visit: Payer: 59 | Admitting: Podiatry

## 2023-02-20 DIAGNOSIS — R0989 Other specified symptoms and signs involving the circulatory and respiratory systems: Secondary | ICD-10-CM

## 2023-02-22 ENCOUNTER — Ambulatory Visit: Payer: 59 | Admitting: Podiatry

## 2023-02-22 NOTE — Telephone Encounter (Signed)
RUQ [place] scheduled on 02/23/23 at 8 am

## 2023-02-23 ENCOUNTER — Ambulatory Visit (HOSPITAL_COMMUNITY): Admission: RE | Admit: 2023-02-23 | Payer: 59 | Source: Ambulatory Visit

## 2023-03-01 ENCOUNTER — Ambulatory Visit: Payer: 59 | Admitting: Podiatry

## 2023-03-02 ENCOUNTER — Ambulatory Visit: Payer: 59 | Admitting: Podiatry

## 2023-03-03 ENCOUNTER — Ambulatory Visit (HOSPITAL_COMMUNITY): Payer: 59

## 2023-03-09 ENCOUNTER — Ambulatory Visit: Payer: 59 | Admitting: Podiatry

## 2023-03-17 ENCOUNTER — Ambulatory Visit (HOSPITAL_COMMUNITY)
Admission: RE | Admit: 2023-03-17 | Discharge: 2023-03-17 | Disposition: A | Discharge status: Home / Self Care | Payer: 59 | Source: Ambulatory Visit | Attending: Physician Assistant | Admitting: Physician Assistant

## 2023-03-17 DIAGNOSIS — K7030 Alcoholic cirrhosis of liver without ascites: Secondary | ICD-10-CM | POA: Insufficient documentation

## 2023-07-11 ENCOUNTER — Other Ambulatory Visit (INDEPENDENT_AMBULATORY_CARE_PROVIDER_SITE_OTHER)
Admission: EM | Admit: 2023-07-11 | Discharge: 2023-07-14 | Disposition: A | Discharge status: Other Acute Inpatient Hospital | Payer: 59 | Attending: Psychiatry | Admitting: Psychiatry

## 2023-07-11 ENCOUNTER — Other Ambulatory Visit: Payer: Self-pay

## 2023-07-11 ENCOUNTER — Ambulatory Visit (HOSPITAL_COMMUNITY)
Admission: EM | Admit: 2023-07-11 | Discharge: 2023-07-11 | Disposition: A | Discharge status: Other Acute Inpatient Hospital | Payer: 59

## 2023-07-11 ENCOUNTER — Emergency Department (HOSPITAL_COMMUNITY): Admission: EM | Admit: 2023-07-11 | Discharge: 2023-07-11 | Discharge status: Against Medical Advice | Payer: 59

## 2023-07-11 DIAGNOSIS — F1490 Cocaine use, unspecified, uncomplicated: Secondary | ICD-10-CM | POA: Insufficient documentation

## 2023-07-11 DIAGNOSIS — Z79899 Other long term (current) drug therapy: Secondary | ICD-10-CM | POA: Insufficient documentation

## 2023-07-11 DIAGNOSIS — F1910 Other psychoactive substance abuse, uncomplicated: Secondary | ICD-10-CM | POA: Diagnosis present

## 2023-07-11 DIAGNOSIS — F69 Unspecified disorder of adult personality and behavior: Secondary | ICD-10-CM

## 2023-07-11 DIAGNOSIS — F1210 Cannabis abuse, uncomplicated: Secondary | ICD-10-CM | POA: Insufficient documentation

## 2023-07-11 DIAGNOSIS — F10930 Alcohol use, unspecified with withdrawal, uncomplicated: Secondary | ICD-10-CM | POA: Diagnosis present

## 2023-07-11 DIAGNOSIS — F331 Major depressive disorder, recurrent, moderate: Secondary | ICD-10-CM | POA: Insufficient documentation

## 2023-07-11 DIAGNOSIS — F1410 Cocaine abuse, uncomplicated: Secondary | ICD-10-CM | POA: Diagnosis present

## 2023-07-11 DIAGNOSIS — F1420 Cocaine dependence, uncomplicated: Secondary | ICD-10-CM | POA: Insufficient documentation

## 2023-07-11 DIAGNOSIS — F1090 Alcohol use, unspecified, uncomplicated: Secondary | ICD-10-CM | POA: Diagnosis present

## 2023-07-11 DIAGNOSIS — F1020 Alcohol dependence, uncomplicated: Secondary | ICD-10-CM | POA: Diagnosis present

## 2023-07-11 DIAGNOSIS — F1010 Alcohol abuse, uncomplicated: Secondary | ICD-10-CM | POA: Insufficient documentation

## 2023-07-11 DIAGNOSIS — F1994 Other psychoactive substance use, unspecified with psychoactive substance-induced mood disorder: Secondary | ICD-10-CM | POA: Diagnosis present

## 2023-07-11 DIAGNOSIS — F339 Major depressive disorder, recurrent, unspecified: Secondary | ICD-10-CM

## 2023-07-11 LAB — CBC WITH DIFFERENTIAL/PLATELET
Abs Immature Granulocytes: 0.02 10*3/uL (ref 0.00–0.07)
Basophils Absolute: 0.1 10*3/uL (ref 0.0–0.1)
Basophils Relative: 1 %
Eosinophils Absolute: 0.4 10*3/uL (ref 0.0–0.5)
Eosinophils Relative: 5 %
HCT: 40.4 % (ref 39.0–52.0)
Hemoglobin: 13.7 g/dL (ref 13.0–17.0)
Immature Granulocytes: 0 %
Lymphocytes Relative: 40 %
Lymphs Abs: 2.7 10*3/uL (ref 0.7–4.0)
MCH: 30.2 pg (ref 26.0–34.0)
MCHC: 33.9 g/dL (ref 30.0–36.0)
MCV: 89.2 fL (ref 80.0–100.0)
Monocytes Absolute: 0.8 10*3/uL (ref 0.1–1.0)
Monocytes Relative: 11 %
Neutro Abs: 3 10*3/uL (ref 1.7–7.7)
Neutrophils Relative %: 43 %
Platelets: 217 10*3/uL (ref 150–400)
RBC: 4.53 MIL/uL (ref 4.22–5.81)
RDW: 14.6 % (ref 11.5–15.5)
WBC: 6.9 10*3/uL (ref 4.0–10.5)
nRBC: 0 % (ref 0.0–0.2)

## 2023-07-11 LAB — POCT URINE DRUG SCREEN - MANUAL ENTRY (I-SCREEN)
POC Amphetamine UR: NOT DETECTED
POC Buprenorphine (BUP): NOT DETECTED
POC Cocaine UR: POSITIVE — AB
POC Marijuana UR: POSITIVE — AB
POC Methadone UR: NOT DETECTED
POC Methamphetamine UR: NOT DETECTED
POC Morphine: NOT DETECTED
POC Oxazepam (BZO): POSITIVE — AB
POC Oxycodone UR: NOT DETECTED
POC Secobarbital (BAR): NOT DETECTED

## 2023-07-11 LAB — COMPREHENSIVE METABOLIC PANEL
ALT: 27 U/L (ref 0–44)
AST: 76 U/L — ABNORMAL HIGH (ref 15–41)
Albumin: 3.1 g/dL — ABNORMAL LOW (ref 3.5–5.0)
Alkaline Phosphatase: 111 U/L (ref 38–126)
Anion gap: 9 (ref 5–15)
BUN: 6 mg/dL — ABNORMAL LOW (ref 8–23)
CO2: 24 mmol/L (ref 22–32)
Calcium: 9 mg/dL (ref 8.9–10.3)
Chloride: 101 mmol/L (ref 98–111)
Creatinine, Ser: 0.7 mg/dL (ref 0.61–1.24)
GFR, Estimated: >60 mL/min (ref >60)
Glucose, Bld: 86 mg/dL (ref 70–99)
Potassium: 4.3 mmol/L (ref 3.5–5.1)
Sodium: 134 mmol/L — ABNORMAL LOW (ref 135–145)
Total Bilirubin: 0.9 mg/dL (ref 0.3–1.2)
Total Protein: 7.9 g/dL (ref 6.5–8.1)

## 2023-07-11 LAB — ETHANOL: Alcohol, Ethyl (B): 52 mg/dL — ABNORMAL HIGH (ref <10)

## 2023-07-11 LAB — TSH: TSH: 1.895 u[IU]/mL (ref 0.350–4.500)

## 2023-07-11 MED ORDER — NAPROXEN 500 MG PO TABS: 500.0000 mg | ORAL_TABLET | Freq: Two times a day (BID) | ORAL | Status: DC | PRN

## 2023-07-11 MED ORDER — MAGNESIUM HYDROXIDE 400 MG/5ML PO SUSP: 30.0000 mL | Freq: Every day | ORAL | Status: DC | PRN

## 2023-07-11 MED ORDER — LOPERAMIDE HCL 2 MG PO CAPS: 2.0000 mg | ORAL_CAPSULE | ORAL | Status: DC | PRN

## 2023-07-11 MED ORDER — ALUM & MAG HYDROXIDE-SIMETH 200-200-20 MG/5ML PO SUSP: 30.0000 mL | ORAL | Status: DC | PRN

## 2023-07-11 MED ORDER — METHOCARBAMOL 500 MG PO TABS: 500.0000 mg | ORAL_TABLET | Freq: Three times a day (TID) | ORAL | Status: DC | PRN

## 2023-07-11 MED ORDER — ZIPRASIDONE MESYLATE 20 MG IM SOLR: 20.0000 mg | INTRAMUSCULAR | Status: DC | PRN

## 2023-07-11 MED ORDER — HYDROXYZINE HCL 25 MG PO TABS: 25.0000 mg | ORAL_TABLET | Freq: Four times a day (QID) | ORAL | Status: DC | PRN

## 2023-07-11 MED ORDER — ONDANSETRON 4 MG PO TBDP: 4.0000 mg | ORAL_TABLET | Freq: Four times a day (QID) | ORAL | Status: DC | PRN

## 2023-07-11 MED ORDER — OLANZAPINE 10 MG PO TBDP: 10.0000 mg | ORAL_TABLET | Freq: Three times a day (TID) | ORAL | Status: DC | PRN

## 2023-07-11 MED ORDER — TRAZODONE HCL 50 MG PO TABS
50.0000 mg | ORAL_TABLET | ORAL | Status: AC
  Administered 2023-07-11: 50 mg via ORAL
  Filled 2023-07-11: qty 1

## 2023-07-11 MED ORDER — DICYCLOMINE HCL 20 MG PO TABS: 20.0000 mg | ORAL_TABLET | Freq: Four times a day (QID) | ORAL | Status: DC | PRN

## 2023-07-11 MED ORDER — ACETAMINOPHEN 325 MG PO TABS: 650.0000 mg | ORAL_TABLET | Freq: Four times a day (QID) | ORAL | Status: DC | PRN

## 2023-07-11 MED ORDER — LORAZEPAM 1 MG PO TABS: 1.0000 mg | ORAL_TABLET | ORAL | Status: DC | PRN

## 2023-07-11 NOTE — ED Triage Notes (Signed)
Pt req rehab place states has a problem with crack last used 2 days ago. Denies si/hi

## 2023-07-11 NOTE — Progress Notes (Signed)
   07/11/23 2143  [facility] Triage Screening (Walk-ins at [facility] only)  How Did You Hear About [place]? Family/Friend  What Is the Reason for Your Visit/Call Today? Pt presents to [facility] voluntarily, accompanied by family requesting substance abuse treatment. Pt reports last using crack about 2 days ago. Pt currently denies SI,HI, AVH.  How Long Has This Been Causing You Problems? > than 6 months  Have You Recently Had Any Thoughts About Hurting Yourself? No  Are You Planning to Commit Suicide/Harm Yourself At This time? No  Have you Recently Had Thoughts About Hurting Someone Karolee Ohs? No  Are You Planning To Harm Someone At This Time? No  Are you currently experiencing any auditory, visual or other hallucinations? No  Have You Used Any Alcohol or Drugs in the Past 24 Hours? No  Do you have any current medical co-morbidities that require immediate attention? No  Clinician description of patient physical appearance/behavior: pt calm, cooperative  What Do You Feel Would Help You the Most Today? Alcohol or Drug Use Treatment  If access to [facility] was not available, would you have sought care in the Emergency Department? No  Determination of Need Routine (7 days)  Options For Referral Other: Comment;Facility-Based Crisis;Chemical Dependency Intensive Outpatient Therapy (CDIOP)

## 2023-07-11 NOTE — ED Notes (Signed)
Pt is A & O x 4. Pt is oriented to the unit and provided with meal. Admission process is completed. Upon assessment, pt had sores on both legs and NP notified for intervention. Pt is illiterate and not able to read nor write. Pt has ankle monitor on.  Pt denies SI/HI/AVH. Will continue to monitor for safety and provide support.

## 2023-07-11 NOTE — ED Provider Notes (Signed)
Facility Based Crisis Admission H&P  Date: 07/12/23 Patient Name: Chad Turner. MRN: [id number] Chief Complaint: increase depression with cocaine use  Diagnoses:  Final diagnoses:  Cocaine abuse (HCC)  Recurrent major depressive disorder, remission status unspecified (HCC)  Behavior concern in adult    HPI: Chad Turner, 62 y/o male with a history of polysubstance use, and depression.  Presented to [facility].  Per the patient he is looking to get rehab from cocaine and he is also experiencing increased depression.  According to the patient his girlfriend left him a couple months ago and he has been depressed.  Patient also reports he used cocaine on a daily basis the last time was 3 days ago.  According to the patient he has been using cocaine for years but went to rehab like 5 years ago at [facility] and some other place in [place] but he has been constantly using for the past 2 years.  According to the patient he is disabled.  Patient is not currently seeing a psychiatrist or therapist.  Per the patient he just moved into a new apartment because he lost the old 1.  Not sure if patient is forthcoming with information.  Patient denies any prior hospitalization for any psychiatric problems in the past.  Apart from rehab when he was at day [facility]. A review of patient records show that patient was discharged from [facility], ED a couple minutes ago prior to coming here.  Face-to-face observation of patient, patient is alert and oriented x 4, speech is clear, maintaining eye contact.  Patient denies SI, HI, AVH or paranoia.  According to patient he smoked marijuana every other day.  And used cocaine at least have all once a day.  Patient reports he drinks alcohol now once every now and then.  Patient is also on probation and does have an ankle monitoring system from the court to his room left ankle. Patient does not seem to be influenced by external or internal stimuli at this  time.  PHQ9 completed ,  pt scored 11 (mild depression).  Recommend inpatient [facility]BC.  PHQ 2-9:  Flowsheet Row ED from 07/11/2023 in [facility]  Thoughts that you would be better off dead, or of hurting yourself in some way Not at all  PHQ-9 Total Score 11       Flowsheet Row ED from 07/11/2023 in [facility] Most recent reading at 07/11/2023 11:31 PM ED from 07/11/2023 in [facility] Most recent reading at 07/11/2023  9:47 PM ED from 07/11/2023 in [facility] Emergency Department at [facility] Most recent reading at 07/11/2023  7:01 PM  C-SSRS RISK CATEGORY No Risk No Risk No Risk       Screenings    Flowsheet Row Most Recent Value  COWS Total Score 1       Total Time spent with patient: 30 minutes  Musculoskeletal  Strength & Muscle Tone: within normal limits Gait & Station: normal Patient leans: N/A  Psychiatric Specialty Exam  Presentation General Appearance:  Casual  Eye Contact: Good  Speech: Clear and Coherent  Speech Volume: Normal  Handedness: Right   Mood and Affect  Mood: Anxious  Affect: Appropriate   Thought Process  Thought Processes: Coherent  Descriptions of Associations:Intact  Orientation:Full (Time, Place and Person)  Thought Content:Logical    Hallucinations:Hallucinations: None  Ideas of Reference:None  Suicidal Thoughts:Suicidal Thoughts: No  Homicidal Thoughts:Homicidal Thoughts: No  Sensorium  Memory: Immediate Fair  Judgment: Poor  Insight: Fair   Chartered certified accountant: Fair  Attention Span: Good  Recall: Good  Fund of Knowledge: Good  Language: Good   Psychomotor Activity  Psychomotor Activity: Psychomotor Activity: Normal   Assets  Assets: Desire for Improvement; Resilience   Sleep  Sleep: Sleep: Fair Number of Hours of Sleep: 6   Nutritional Assessment (For OBS and  FBC admissions only) Has the patient had a weight loss or gain of 10 pounds or more in the last 3 months?: No Has the patient had a decrease in food intake/or appetite?: No Does the patient have dental problems?: No Does the patient have eating habits or behaviors that may be indicators of an eating disorder including binging or inducing vomiting?: No Has the patient recently lost weight without trying?: 0 Has the patient been eating poorly because of a decreased appetite?: 0 Malnutrition Screening Tool Score: 0    Physical Exam HENT:     Head: Normocephalic.     Nose: Nose normal.  Eyes:     Pupils: Pupils are equal, round, and reactive to light.  Cardiovascular:     Rate and Rhythm: Normal rate.  Pulmonary:     Effort: Pulmonary effort is normal.  Musculoskeletal:        General: Normal range of motion.     Cervical back: Normal range of motion.  Neurological:     General: No focal deficit present.     Mental Status: He is alert.  Psychiatric:        Mood and Affect: Mood normal.        Behavior: Behavior normal.        Thought Content: Thought content normal.        Judgment: Judgment normal.    Review of Systems  Constitutional: Negative.   HENT: Negative.    Eyes: Negative.   Respiratory: Negative.    Cardiovascular: Negative.   Gastrointestinal: Negative.   Genitourinary: Negative.   Musculoskeletal: Negative.   Skin: Negative.   Neurological: Negative.   Psychiatric/Behavioral:  Positive for depression and substance abuse. The patient is nervous/anxious.     Blood pressure (!) 156/88, pulse 86, temperature 99.3 F (37.4 C), temperature source Oral, resp. rate 16, SpO2 100%. There is no height or weight on file to calculate BMI.  Past Psychiatric History: GAD, polysubstance abuse   Is the patient at risk to self? No  Has the patient been a risk to self in the past 6 months? No .    Has the patient been a risk to self within the distant past? No   Is the  patient a risk to others? No   Has the patient been a risk to others in the past 6 months? No   Has the patient been a risk to others within the distant past? No   Past Medical History: see chart  Family History: unknown Social History: etoh,  marijuana and cocaine  Last Labs:  Admission on 07/11/2023  Component Date Value Ref Range Status   WBC 07/11/2023 6.9  4.0 - 10.5 K/uL Final   RBC 07/11/2023 4.53  4.22 - 5.81 MIL/uL Final   Hemoglobin 07/11/2023 13.7  13.0 - 17.0 g/dL Final   HCT 16/08/9603 40.4  39.0 - 52.0 % Final   MCV 07/11/2023 89.2  80.0 - 100.0 fL Final   MCH 07/11/2023 30.2  26.0 - 34.0 pg Final   MCHC 07/11/2023 33.9  30.0 -  36.0 g/dL Final   RDW 13/24/4010 14.6  11.5 - 15.5 % Final   Platelets 07/11/2023 217  150 - 400 K/uL Final   nRBC 07/11/2023 0.0  0.0 - 0.2 % Final   Neutrophils Relative % 07/11/2023 43  % Final   Neutro Abs 07/11/2023 3.0  1.7 - 7.7 K/uL Final   Lymphocytes Relative 07/11/2023 40  % Final   Lymphs Abs 07/11/2023 2.7  0.7 - 4.0 K/uL Final   Monocytes Relative 07/11/2023 11  % Final   Monocytes Absolute 07/11/2023 0.8  0.1 - 1.0 K/uL Final   Eosinophils Relative 07/11/2023 5  % Final   Eosinophils Absolute 07/11/2023 0.4  0.0 - 0.5 K/uL Final   Basophils Relative 07/11/2023 1  % Final   Basophils Absolute 07/11/2023 0.1  0.0 - 0.1 K/uL Final   Immature Granulocytes 07/11/2023 0  % Final   Abs Immature Granulocytes 07/11/2023 0.02  0.00 - 0.07 K/uL Final   Performed at [facility], [address], [place], [place] [address]   Sodium 07/11/2023 134 (L)  135 - 145 mmol/L Final   Potassium 07/11/2023 4.3  3.5 - 5.1 mmol/L Final   Chloride 07/11/2023 101  98 - 111 mmol/L Final   CO2 07/11/2023 24  22 - 32 mmol/L Final   Glucose, Bld 07/11/2023 86  70 - 99 mg/dL Final   Glucose reference range applies only to samples taken after fasting for at least 8 hours.   BUN 07/11/2023 6 (L)  8 - 23 mg/dL Final   Creatinine, Ser 07/11/2023 0.70   0.61 - 1.24 mg/dL Final   Calcium 66/44/0347 9.0  8.9 - 10.3 mg/dL Final   Total Protein 42/59/5638 7.9  6.5 - 8.1 g/dL Final   Albumin 75/64/3329 3.1 (L)  3.5 - 5.0 g/dL Final   AST 51/88/4166 76 (H)  15 - 41 U/L Final   ALT 07/11/2023 27  0 - 44 U/L Final   Alkaline Phosphatase 07/11/2023 111  38 - 126 U/L Final   Total Bilirubin 07/11/2023 0.9  0.3 - 1.2 mg/dL Final   GFR, Estimated 07/11/2023 >60  >60 mL/min Final   Comment: (NOTE) Calculated using the CKD-EPI Creatinine Equation (2021)    Anion gap 07/11/2023 9  5 - 15 Final   Performed at [facility], [address] [place], [place] [address]   Alcohol, Ethyl (B) 07/11/2023 52 (H)  <10 mg/dL Final   Comment: (NOTE) Lowest detectable limit for serum alcohol is 10 mg/dL.  For medical purposes only. Performed at [facility], [address], [place], [place] [address]    TSH 07/11/2023 1.895  0.350 - 4.500 uIU/mL Final   Comment: Performed by a 3rd Generation assay with a functional sensitivity of <=0.01 uIU/mL. Performed at [facility], [address] [place], [place] [address]    POC Amphetamine UR 07/11/2023 None Detected  NONE DETECTED (Cut Off Level 1000 ng/mL) Final   POC Secobarbital (BAR) 07/11/2023 None Detected  NONE DETECTED (Cut Off Level 300 ng/mL) Final   POC Buprenorphine (BUP) 07/11/2023 None Detected  NONE DETECTED (Cut Off Level 10 ng/mL) Final   POC Oxazepam (BZO) 07/11/2023 Positive (A)  NONE DETECTED (Cut Off Level 300 ng/mL) Final   POC Cocaine UR 07/11/2023 Positive (A)  NONE DETECTED (Cut Off Level 300 ng/mL) Final   POC Methamphetamine UR 07/11/2023 None Detected  NONE DETECTED (Cut Off Level 1000 ng/mL) Final   POC Morphine 07/11/2023  None Detected  NONE DETECTED (Cut Off Level 300 ng/mL) Final   POC Methadone UR 07/11/2023 None Detected  NONE DETECTED (Cut Off Level 300 ng/mL) Final   POC Oxycodone UR 07/11/2023 None Detected  NONE DETECTED (Cut Off Level 100 ng/mL) Final   POC  Marijuana UR 07/11/2023 Positive (A)  NONE DETECTED (Cut Off Level 50 ng/mL) Final    Allergies: Penicillins  Medications:  Facility Ordered Medications  Medication   acetaminophen (TYLENOL) tablet 650 mg   alum & mag hydroxide-simeth (MAALOX/MYLANTA) 200-200-20 MG/5ML suspension 30 mL   magnesium hydroxide (MILK OF MAGNESIA) suspension 30 mL   OLANZapine zydis (ZYPREXA) disintegrating tablet 10 mg   And   LORazepam (ATIVAN) tablet 1 mg   And   ziprasidone (GEODON) injection 20 mg   dicyclomine (BENTYL) tablet 20 mg   hydrOXYzine (ATARAX) tablet 25 mg   loperamide (IMODIUM) capsule 2-4 mg   methocarbamol (ROBAXIN) tablet 500 mg   naproxen (NAPROSYN) tablet 500 mg   ondansetron (ZOFRAN-ODT) disintegrating tablet 4 mg   [COMPLETED] traZODone (DESYREL) tablet 50 mg   gabapentin (NEURONTIN) capsule 600 mg   hydrochlorothiazide (HYDRODIURIL) tablet 12.5 mg   losartan (COZAAR) tablet 100 mg   montelukast (SINGULAIR) tablet 10 mg   multivitamin with minerals tablet 1 tablet   nadolol (CORGARD) tablet 20 mg   pravastatin (PRAVACHOL) tablet 20 mg   fluticasone furoate-vilanterol (BREO ELLIPTA) 200-25 MCG/ACT 1 puff   PTA Medications  Medication Sig   SYMBICORT 160-4.5 MCG/ACT inhaler Inhale 2 puffs into the lungs 2 (two) times daily as needed (asthma).   nadolol (CORGARD) 20 MG tablet Take 1 tablet (20 mg total) by mouth daily. (Patient not taking: Reported on 02/23/2021)   hydrochlorothiazide (HYDRODIURIL) 12.5 MG tablet Take 12.5 mg by mouth daily.   losartan (COZAAR) 100 MG tablet Take 100 mg by mouth daily.   Multiple Vitamins-Minerals (MULTIVITAMIN WITH MINERALS) tablet Take 1 tablet by mouth daily.   ferrous sulfate 325 (65 FE) MG tablet Take 325 mg by mouth daily with breakfast.   pantoprazole (PROTONIX) 40 MG tablet TAKE 1 TABLET(40 MG) BY MOUTH DAILY   ondansetron (ZOFRAN-ODT) 4 MG disintegrating tablet Take 1 tablet (4 mg total) by mouth every 8 (eight) hours as needed for  nausea or vomiting.   gabapentin (NEURONTIN) 600 MG tablet Take 600 mg by mouth 3 (three) times daily.   hydrOXYzine (ATARAX) 50 MG tablet TAKE 1 TO 2 TABLETS BY MOUTH EVERY NIGHT AT BEDTIME AS NEEDED FOR INSOMNIA   montelukast (SINGULAIR) 10 MG tablet Take 10 mg by mouth daily.   oxyCODONE (OXY IR/ROXICODONE) 5 MG immediate release tablet Take by mouth.   pravastatin (PRAVACHOL) 20 MG tablet Take 20 mg by mouth daily.   tiZANidine (ZANAFLEX) 4 MG capsule    traMADol (ULTRAM) 50 MG tablet Take 50 mg by mouth daily as needed.   methylPREDNISolone (MEDROL DOSEPAK) 4 MG TBPK tablet Take as directed    Long Term Goals: Improvement in symptoms so as ready for discharge  Short Term Goals: Patient will verbalize feelings in meetings with treatment team members., Patient will attend at least of 50% of the groups daily., Pt will complete the PHQ9 on admission, day 3 and discharge., Patient will participate in completing the [place] Suicide Severity Rating Scale, Patient will score a low risk of violence for 24 hours prior to discharge, and Patient will take medications as prescribed daily.  Medical Decision Making  Inpaitent FBC   Meds ordered this encounter  Medications   acetaminophen (TYLENOL) tablet 650 mg   alum & mag hydroxide-simeth (MAALOX/MYLANTA) 200-200-20 MG/5ML suspension 30 mL   magnesium hydroxide (MILK OF MAGNESIA) suspension 30 mL   AND Linked Order Group    OLANZapine zydis (ZYPREXA) disintegrating tablet 10 mg    LORazepam (ATIVAN) tablet 1 mg    ziprasidone (GEODON) injection 20 mg   dicyclomine (BENTYL) tablet 20 mg   hydrOXYzine (ATARAX) tablet 25 mg   loperamide (IMODIUM) capsule 2-4 mg   methocarbamol (ROBAXIN) tablet 500 mg   naproxen (NAPROSYN) tablet 500 mg   ondansetron (ZOFRAN-ODT) disintegrating tablet 4 mg   traZODone (DESYREL) tablet 50 mg   gabapentin (NEURONTIN) capsule 600 mg   hydrochlorothiazide (HYDRODIURIL) tablet 12.5 mg   losartan (COZAAR) tablet  100 mg   montelukast (SINGULAIR) tablet 10 mg   multivitamin with minerals tablet 1 tablet   nadolol (CORGARD) tablet 20 mg   DISCONTD: ondansetron (ZOFRAN-ODT) disintegrating tablet 4 mg   pravastatin (PRAVACHOL) tablet 20 mg   fluticasone furoate-vilanterol (BREO ELLIPTA) 200-25 MCG/ACT 1 puff    Lab Orders         CBC with Differential/Platelet         Comprehensive metabolic panel         Ethanol         TSH         POCT Urine Drug Screen - (I-Screen)      Recommendations  Based on my evaluation the patient does not appear to have an emergency medical condition.  Sindy Guadeloupe, NP 07/12/23  6:04 AM

## 2023-07-12 ENCOUNTER — Encounter (HOSPITAL_COMMUNITY): Payer: Self-pay

## 2023-07-12 DIAGNOSIS — F1410 Cocaine abuse, uncomplicated: Secondary | ICD-10-CM | POA: Diagnosis not present

## 2023-07-12 DIAGNOSIS — F1910 Other psychoactive substance abuse, uncomplicated: Secondary | ICD-10-CM | POA: Diagnosis not present

## 2023-07-12 MED ORDER — LORAZEPAM 1 MG PO TABS
1.0000 mg | ORAL_TABLET | Freq: Four times a day (QID) | ORAL | Status: AC
  Administered 2023-07-12 – 2023-07-13 (×4): 1 mg via ORAL
  Filled 2023-07-12 (×4): qty 1

## 2023-07-12 MED ORDER — LORAZEPAM 1 MG PO TABS: 1.0000 mg | ORAL_TABLET | Freq: Three times a day (TID) | ORAL | Status: DC

## 2023-07-12 MED ORDER — LORAZEPAM 1 MG PO TABS: 1.0000 mg | ORAL_TABLET | Freq: Two times a day (BID) | ORAL | Status: DC

## 2023-07-12 MED ORDER — MONTELUKAST SODIUM 10 MG PO TABS
10.0000 mg | ORAL_TABLET | Freq: Every day | ORAL | Status: DC
  Administered 2023-07-12: 10 mg via ORAL
  Filled 2023-07-12: qty 1

## 2023-07-12 MED ORDER — FLUTICASONE FUROATE-VILANTEROL 200-25 MCG/ACT IN AEPB
1.0000 | INHALATION_SPRAY | Freq: Every day | RESPIRATORY_TRACT | Status: DC
  Filled 2023-07-12: qty 28

## 2023-07-12 MED ORDER — LORAZEPAM 1 MG PO TABS: 1.0000 mg | ORAL_TABLET | Freq: Every day | ORAL | Status: DC

## 2023-07-12 MED ORDER — GABAPENTIN 300 MG PO CAPS
600.0000 mg | ORAL_CAPSULE | Freq: Three times a day (TID) | ORAL | Status: DC
  Administered 2023-07-12 – 2023-07-14 (×7): 600 mg via ORAL
  Filled 2023-07-12 (×8): qty 2

## 2023-07-12 MED ORDER — PRAVASTATIN SODIUM 10 MG PO TABS
20.0000 mg | ORAL_TABLET | Freq: Every day | ORAL | Status: DC
  Administered 2023-07-12 – 2023-07-14 (×3): 20 mg via ORAL
  Filled 2023-07-12 (×3): qty 2

## 2023-07-12 MED ORDER — ONDANSETRON 4 MG PO TBDP: 4.0000 mg | ORAL_TABLET | Freq: Three times a day (TID) | ORAL | Status: DC | PRN

## 2023-07-12 MED ORDER — HYDROCHLOROTHIAZIDE 12.5 MG PO TABS
12.5000 mg | ORAL_TABLET | Freq: Every day | ORAL | Status: DC
  Administered 2023-07-12: 12.5 mg via ORAL
  Filled 2023-07-12: qty 1

## 2023-07-12 MED ORDER — THIAMINE HCL 100 MG/ML IJ SOLN
100.0000 mg | Freq: Once | INTRAMUSCULAR | Status: AC
  Administered 2023-07-12: 100 mg via INTRAMUSCULAR
  Filled 2023-07-12: qty 2

## 2023-07-12 MED ORDER — NADOLOL 20 MG PO TABS
20.0000 mg | ORAL_TABLET | Freq: Every day | ORAL | Status: DC
  Filled 2023-07-12 (×3): qty 1

## 2023-07-12 MED ORDER — LOSARTAN POTASSIUM 50 MG PO TABS
100.0000 mg | ORAL_TABLET | Freq: Every day | ORAL | Status: DC
  Administered 2023-07-12 – 2023-07-14 (×3): 100 mg via ORAL
  Filled 2023-07-12 (×3): qty 2

## 2023-07-12 MED ORDER — ADULT MULTIVITAMIN W/MINERALS CH
1.0000 | ORAL_TABLET | Freq: Every day | ORAL | Status: DC
  Administered 2023-07-12 – 2023-07-14 (×3): 1 via ORAL
  Filled 2023-07-12 (×3): qty 1

## 2023-07-12 MED ORDER — THIAMINE MONONITRATE 100 MG PO TABS
100.0000 mg | ORAL_TABLET | Freq: Every day | ORAL | Status: DC
  Administered 2023-07-13 – 2023-07-14 (×2): 100 mg via ORAL
  Filled 2023-07-12 (×2): qty 1

## 2023-07-12 NOTE — ED Notes (Addendum)
Patient is currently watching TV with others. During group session, patient was attentive and cooperative but seemed to need a little time to comprehend conversation content. Patient expresses that he cops with SUD by washing his car and walking his dog. He appears to have insight on needing to stay away from people who influences/ triggers him to engage in SUD activity. Patient expressed desire to travel outside of the [place] or at least further than [place]. Patient denies SI, HI, AVH. No signs of internal stimuli influences.
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
## 2023-07-12 NOTE — ED Provider Notes (Signed)
RN notify Clinical research associate of discoloration spot to pt legs,  upon assessment pt has discoloration due to PVD,  per the patient he is seeing a cardiologist.  Pt denies any pain or discomfort to legs.  Pt advice to let us know if he needs anything.

## 2023-07-12 NOTE — BH IP Treatment Plan (Signed)
Interdisciplinary Treatment and Diagnostic Plan Update  07/12/2023 Time of Session: 10:29AM Chad Turner. MRN: [id number]  Diagnosis:  Final diagnoses:  Cocaine abuse (HCC)  Alcohol use disorder  Moderate episode of recurrent major depressive disorder (HCC)  Cannabis abuse     Current Medications:  Current Facility-Administered Medications  Medication Dose Route Frequency Provider Last Rate Last Admin   acetaminophen (TYLENOL) tablet 650 mg  650 mg Oral Q6H PRN Sindy Guadeloupe, NP       alum & mag hydroxide-simeth (MAALOX/MYLANTA) 200-200-20 MG/5ML suspension 30 mL  30 mL Oral Q4H PRN Sindy Guadeloupe, NP       dicyclomine (BENTYL) tablet 20 mg  20 mg Oral Q6H PRN Sindy Guadeloupe, NP       gabapentin (NEURONTIN) capsule 600 mg  600 mg Oral TID Sindy Guadeloupe, NP   600 mg at 07/12/23 4098   hydrOXYzine (ATARAX) tablet 25 mg  25 mg Oral Q6H PRN Sindy Guadeloupe, NP       loperamide (IMODIUM) capsule 2-4 mg  2-4 mg Oral PRN Sindy Guadeloupe, NP       LORazepam (ATIVAN) tablet 1 mg  1 mg Oral QID Kizzie Ide B, MD       Followed by   Melene Muller ON 07/13/2023] LORazepam (ATIVAN) tablet 1 mg  1 mg Oral TID Kizzie Ide B, MD       Followed by   Melene Muller ON 07/14/2023] LORazepam (ATIVAN) tablet 1 mg  1 mg Oral BID Kizzie Ide B, MD       Followed by   Melene Muller ON 07/16/2023] LORazepam (ATIVAN) tablet 1 mg  1 mg Oral Daily Kizzie Ide B, MD       losartan (COZAAR) tablet 100 mg  100 mg Oral Daily Sindy Guadeloupe, NP   100 mg at 07/12/23 0933   magnesium hydroxide (MILK OF MAGNESIA) suspension 30 mL  30 mL Oral Daily PRN Sindy Guadeloupe, NP       methocarbamol (ROBAXIN) tablet 500 mg  500 mg Oral Q8H PRN Sindy Guadeloupe, NP       multivitamin with minerals tablet 1 tablet  1 tablet Oral Daily Sindy Guadeloupe, NP   1 tablet at 07/12/23 0933   naproxen (NAPROSYN) tablet 500 mg  500 mg Oral BID PRN Sindy Guadeloupe, NP       ondansetron (ZOFRAN-ODT) disintegrating tablet 4 mg  4 mg Oral Q6H PRN Sindy Guadeloupe,  NP       pravastatin (PRAVACHOL) tablet 20 mg  20 mg Oral Daily Sindy Guadeloupe, NP   20 mg at 07/12/23 1191   thiamine (VITAMIN B1) injection 100 mg  100 mg Intramuscular Once Lance Muss, MD       [START ON 07/13/2023] thiamine (VITAMIN B1) tablet 100 mg  100 mg Oral Daily Lance Muss, MD       Current Outpatient Medications  Medication Sig Dispense Refill   gabapentin (NEURONTIN) 600 MG tablet Take 600 mg by mouth 3 (three) times daily.     hydrOXYzine (ATARAX) 50 MG tablet Take 50 mg by mouth at bedtime.     losartan (COZAAR) 100 MG tablet Take 100 mg by mouth daily.     Multiple Vitamins-Minerals (MULTIVITAMIN WITH MINERALS) tablet Take 1 tablet by mouth daily.     pravastatin (PRAVACHOL) 20 MG tablet Take 20 mg by mouth daily.     PTA Medications: Prior to Admission medications   Medication Sig Start Date End Date Taking? Authorizing Provider  gabapentin (NEURONTIN) 600  MG tablet Take 600 mg by mouth 3 (three) times daily. 12/25/21  Yes [provider]  hydrOXYzine (ATARAX) 50 MG tablet Take 50 mg by mouth at bedtime. 12/07/21  Yes [provider]  losartan (COZAAR) 100 MG tablet Take 100 mg by mouth daily.   Yes [provider]  Multiple Vitamins-Minerals (MULTIVITAMIN WITH MINERALS) tablet Take 1 tablet by mouth daily.   Yes [provider]  pravastatin (PRAVACHOL) 20 MG tablet Take 20 mg by mouth daily. 10/25/21  Yes [provider]  fluticasone (FLONASE) 50 MCG/ACT nasal spray Place 2 sprays into the nose daily. 10/12/11 01/09/12  Domenick Gong, MD    Patient Stressors: Legal issue   Substance abuse    Patient Strengths: Capable of independent living  General fund of knowledge  Motivation for treatment/growth  Supportive family/friends   Treatment Modalities: Medication Management, Group therapy, Case management,  1 to 1 session with clinician, Psychoeducation, Recreational therapy.   Physician Treatment Plan for  Primary and Secondary Diagnosis:  Final diagnoses:  Cocaine abuse (HCC)  Alcohol use disorder  Moderate episode of recurrent major depressive disorder (HCC)  Cannabis abuse   Long Term Goal(s): Improvement in symptoms so as ready for discharge  Short Term Goals: Patient will verbalize feelings in meetings with treatment team members. Patient will attend at least of 50% of the groups daily. Pt will complete the PHQ9 on admission, day 3 and discharge. Patient will participate in completing the [place] Suicide Severity Rating Scale Patient will score a low risk of violence for 24 hours prior to discharge Patient will take medications as prescribed daily.  Medication Management: Evaluate patient's response, side effects, and tolerance of medication regimen.  Therapeutic Interventions: 1 to 1 sessions, Unit Group sessions and Medication administration.  Evaluation of Outcomes: Progressing  LCSW Treatment Plan for Primary Diagnosis:  Final diagnoses:  Cocaine abuse (HCC)  Alcohol use disorder  Moderate episode of recurrent major depressive disorder (HCC)  Cannabis abuse    Long Term Goal(s): Safe transition to appropriate next level of care at discharge.  Short Term Goals: Facilitate acceptance of mental health diagnosis and concerns through verbal commitment to aftercare plan and appointments at discharge., Patient will identify one social support prior to discharge to aid in patient's recovery., Patient will attend AA/NA groups as scheduled., Identify minimum of 2 triggers associated with mental health/substance abuse issues with treatment team members., and Increase skills for wellness and recovery by attending 50% of scheduled groups.  Therapeutic Interventions: Assess for all discharge needs, 1 to 1 time with Child psychotherapist, Explore available resources and support systems, Assess for adequacy in community support network, Educate family and significant other(s) on suicide prevention,  Complete Psychosocial Assessment, Interpersonal group therapy.  Evaluation of Outcomes: Progressing   Progress in Treatment: Attending groups: Yes. Participating in groups: Yes. Taking medication as prescribed: Yes. Toleration medication: Yes. Family/Significant other contact made: No, will contact:  patient declined collateral at this time. LCSW will revisit conversation at a later time. Patient understands diagnosis: Yes. Discussing patient identified problems/goals with staff: Yes. Medical problems stabilized or resolved: Yes. Denies suicidal/homicidal ideation: Yes. Issues/concerns per patient self-inventory: Yes. Other: substance use and need for further treatment  New problem(s) identified: No, Describe:  other than reported on admission  New Short Term/Long Term Goal(s): Safe transition to appropriate next level of care at discharge, Engage patient in therapeutic group addressing interpersonal concerns. Engage patient in aftercare planning with referrals and resources, Increase ability to appropriately verbalize  feelings, Facilitate acceptance of mental health diagnosis and concerns and Identify triggers associated with mental health/substance abuse issues.   Patient Goals:  Patient is seeking residential placement at this time for substance use.   Discharge Plan or Barriers: LCSW will send referrals out for review for residential placement. Updates will be provided as received.   Reason for Continuation of Hospitalization: Medication stabilization Withdrawal symptoms  Estimated Length of Stay: 3-5 days  Last 3 [place] Suicide Severity Risk Score: Flowsheet Row ED from 07/11/2023 in [facility] Most recent reading at 07/11/2023 11:31 PM ED from 07/11/2023 in [facility] Most recent reading at 07/11/2023  9:47 PM ED from 07/11/2023 in [facility] Emergency Department at [facility] Most recent reading at 07/11/2023   7:01 PM  C-SSRS RISK CATEGORY No Risk No Risk No Risk       Last PHQ 2/9 Scores:    07/11/2023   10:33 PM  Depression screen PHQ 2/9  Decreased Interest 3  Down, Depressed, Hopeless 3  PHQ - 2 Score 6  Altered sleeping 1  Tired, decreased energy 1  Change in appetite 1  Feeling bad or failure about yourself  1  Trouble concentrating 1  Moving slowly or fidgety/restless 0  Suicidal thoughts 0  PHQ-9 Score 11  Difficult doing work/chores Not difficult at all    Scribe for Treatment Team: Loleta Dicker, LCSWA 07/12/2023 11:18 AM
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
## 2023-07-12 NOTE — ED Notes (Addendum)
Patient with writers assistance contacted [employer] to inquire of process required for patient to transfer to [facility]. Communicated with Officer Aquooina who stated that the patient/ facility staff can contact the [employer] when patient is actively being transferred. SW, J.S. made aware.

## 2023-07-12 NOTE — ED Notes (Signed)
Patients Nadolol is not loaded in the pixis. Pharmacist stated the medication is not available here, will have to have it delivered from another pharmacy. Medication time adjusted to 1200.

## 2023-07-12 NOTE — ED Provider Notes (Signed)
[place] EMERGENCY DEPARTMENT AT [facility] Provider Note   CSN: [id number] Arrival date & time: 07/11/23  1832     History {Add pertinent medical, surgical, social history, OB history to HPI:1} Chief Complaint  Patient presents with  . Drug Problem    Chad Turner. is a 62 y.o. male.  Presenting emergency department requesting help with substance use.  Reports he is having issues with cocaine crack.  Also endorses alcohol use, and would like to stop drinking.  His last drink was several hours prior to arrival.  Adamantly denies SI, HI or hallucinations.  No physical complaints.  No toxic ingestion prior to arrival.   Drug Problem      Home Medications Prior to Admission medications   Medication Sig Start Date End Date Taking? Authorizing Provider  gabapentin (NEURONTIN) 600 MG tablet Take 600 mg by mouth 3 (three) times daily. 12/25/21   [provider]  hydrOXYzine (ATARAX) 50 MG tablet Take 50 mg by mouth at bedtime. 12/07/21   [provider]  losartan (COZAAR) 100 MG tablet Take 100 mg by mouth daily.    [provider]  Multiple Vitamins-Minerals (MULTIVITAMIN WITH MINERALS) tablet Take 1 tablet by mouth daily.    [provider]  pravastatin (PRAVACHOL) 20 MG tablet Take 20 mg by mouth daily. 10/25/21   [provider]  fluticasone (FLONASE) 50 MCG/ACT nasal spray Place 2 sprays into the nose daily. 10/12/11 01/09/12  Domenick Gong, MD      Allergies    Penicillins    Review of Systems   Review of Systems  Physical Exam Updated Vital Signs BP (!) 157/95 (BP Location: Right Arm)   Pulse 86   Temp 99.3 F (37.4 C) (Oral)   Resp 16   Ht 5\' 10"  (1.778 m)   Wt 95 kg   SpO2 100%   BMI 30.05 kg/m  Physical Exam Vitals and nursing note reviewed.  Constitutional:      General: He is not in acute distress.    Appearance: He is obese. He is not toxic-appearing.  HENT:     Head: Normocephalic.      Mouth/Throat:     Mouth: Mucous membranes are moist.  Eyes:     Conjunctiva/sclera: Conjunctivae normal.  Cardiovascular:     Rate and Rhythm: Normal rate and regular rhythm.  Pulmonary:     Effort: Pulmonary effort is normal.  Abdominal:     General: Abdomen is flat. There is no distension.     Tenderness: There is no abdominal tenderness. There is no guarding or rebound.  Musculoskeletal:        General: Normal range of motion.  Skin:    General: Skin is warm.  Neurological:     Mental Status: He is alert. Mental status is at baseline.  Psychiatric:        Mood and Affect: Mood normal.        Behavior: Behavior normal.    ED Results / Procedures / Treatments   Labs (all labs ordered are listed, but only abnormal results are displayed) Labs Reviewed - No data to display  EKG None  Radiology No results found.  Procedures Procedures  {Document cardiac monitor, telemetry assessment procedure when appropriate:1}  Medications Ordered in ED Medications - No data to display  ED Course/ Medical Decision Making/ A&P   {   Click here for ABCD2, HEART and other calculatorsREFRESH Note before signing :1}  Medical Decision Making  ***  {Document critical care time when appropriate:1} {Document review of labs and clinical decision tools ie heart score, Chads2Vasc2 etc:1}  {Document your independent review of radiology images, and any outside records:1} {Document your discussion with family members, caretakers, and with consultants:1} {Document social determinants of health affecting pt's care:1} {Document your decision making why or why not admission, treatments were needed:1} Final Clinical Impression(s) / ED Diagnoses Final diagnoses:  None    Rx / DC Orders ED Discharge Orders     None

## 2023-07-12 NOTE — ED Notes (Signed)
Patient is A&O x 4, calm and cooperative. Denies discomfort, SI, HI, AVH. Does not appear to be responding to internal stimuli. Patient stated he wanted to drink but understood that is why he came to the facility. Self- aware of need for SUD/ MH assistance.
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
## 2023-07-12 NOTE — ED Notes (Signed)
Patient is in the Dayroom watching TV with other patients. NAD. Respirations even and unlabored. Will continue to monitor for safety.

## 2023-07-13 DIAGNOSIS — F1410 Cocaine abuse, uncomplicated: Secondary | ICD-10-CM | POA: Diagnosis not present

## 2023-07-13 MED ORDER — LORAZEPAM 1 MG PO TABS
1.0000 mg | ORAL_TABLET | Freq: Once | ORAL | Status: AC
  Administered 2023-07-13: 1 mg via ORAL
  Filled 2023-07-13: qty 1

## 2023-07-13 MED ORDER — LORAZEPAM 1 MG PO TABS
1.0000 mg | ORAL_TABLET | Freq: Once | ORAL | Status: AC
  Administered 2023-07-14: 1 mg via ORAL
  Filled 2023-07-13: qty 1

## 2023-07-13 NOTE — Progress Notes (Signed)
Pt was visible in the milieu. No distress noted or concerns voiced. Staff will monitor for pt's safety.

## 2023-07-13 NOTE — Discharge Planning (Signed)
LCSW followed up with [facility] Admissions Coordinator Erin who reports patient would be a good candidate for their treatment facility. Per Denny Peon, transportation can be arranged via  [facility] for the patient to arrive on tomorrow 07/14/2023. Per Denny Peon, facility will call in the morning to inform of ETA. LCSW spoke with patient to confirm if he is in agreement with plan. Patient is agreeable to plan and expressed appreciation for LCSW assistance. Due to patient being having an ankle monitor, patient and [facility] to notify officers once the patient is leaving the [facility] and will provide address of facility transfer. 30 day script needed and medications can be filled at their facility. No other needs were reported by patient or facility.   LCSW will continue to follow and provide support to patient while on [facility] unit.   Fernande Boyden, LCSW Clinical Social Worker [place] [facility] Ph: [phone]

## 2023-07-13 NOTE — ED Notes (Signed)
Patient is sleeping. Respirations equal and unlabored, skin warm and dry. No change in assessment or acuity. Routine safety checks conducted according to facility protocol. Will continue to monitor for safety.   
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
## 2023-07-13 NOTE — ED Notes (Signed)
Pt is in the dayroom watching TV with peers. Pt denies SI/HI/AVH. No acute distress noted. Will continue to monitor for safety. 
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
## 2023-07-13 NOTE — ED Notes (Signed)
Patient in the bedroom sleeping at the moment. NAD. Respirations are even and unlabored. Will continue to monitor for safety.

## 2023-07-13 NOTE — Progress Notes (Signed)
Pt is awake, alert and oriented X4. Pt did not voice any complaints of pain or discomfort. No signs of acute distress noted. Administered scheduled med per order. Pt denies current SI/HI/AVH, plan or intent. Staff will monitor for pt's safety.
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
## 2023-07-14 DIAGNOSIS — F1410 Cocaine abuse, uncomplicated: Secondary | ICD-10-CM | POA: Diagnosis not present

## 2023-07-14 MED ORDER — GABAPENTIN 600 MG PO TABS: 600.0000 mg | ORAL_TABLET | Freq: Three times a day (TID) | ORAL | 0 refills | Status: AC

## 2023-07-14 MED ORDER — LOSARTAN POTASSIUM 100 MG PO TABS: 100.0000 mg | ORAL_TABLET | Freq: Every day | ORAL | 0 refills | Status: AC

## 2023-07-14 MED ORDER — PRAVASTATIN SODIUM 20 MG PO TABS: 20.0000 mg | ORAL_TABLET | Freq: Every day | ORAL | 0 refills | Status: AC

## 2023-07-14 NOTE — ED Notes (Signed)
Patient awake on unit and preparing to go to [facility] treatment center later today.  Patient is without distress or complaint at this time.  Will monitor.

## 2023-07-14 NOTE — ED Notes (Signed)
Patient is sleeping. Respirations equal and unlabored, skin warm and dry. No change in assessment or acuity. Routine safety checks conducted according to facility protocol. Will continue to monitor for safety.   
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
## 2023-07-14 NOTE — Discharge Instructions (Signed)
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
## 2023-09-21 ENCOUNTER — Other Ambulatory Visit: Payer: Self-pay

## 2023-09-21 DIAGNOSIS — K7030 Alcoholic cirrhosis of liver without ascites: Secondary | ICD-10-CM

## 2023-09-29 ENCOUNTER — Ambulatory Visit (HOSPITAL_COMMUNITY): Payer: 59

## 2023-10-04 ENCOUNTER — Ambulatory Visit (HOSPITAL_COMMUNITY): Admission: RE | Admit: 2023-10-04 | Payer: 59 | Source: Ambulatory Visit

## 2023-10-06 ENCOUNTER — Ambulatory Visit (HOSPITAL_COMMUNITY): Admission: RE | Admit: 2023-10-06 | Payer: 59 | Source: Ambulatory Visit

## 2023-10-13 ENCOUNTER — Ambulatory Visit (HOSPITAL_COMMUNITY): Payer: 59

## 2023-11-03 ENCOUNTER — Ambulatory Visit (HOSPITAL_COMMUNITY): Admission: RE | Admit: 2023-11-03 | Payer: 59 | Source: Ambulatory Visit

## 2023-11-07 ENCOUNTER — Ambulatory Visit (HOSPITAL_COMMUNITY)
Admission: RE | Admit: 2023-11-07 | Discharge: 2023-11-07 | Disposition: A | Discharge status: Home / Self Care | Payer: 59 | Source: Ambulatory Visit | Attending: Internal Medicine | Admitting: Internal Medicine

## 2023-11-07 ENCOUNTER — Other Ambulatory Visit: Payer: Self-pay

## 2023-11-07 DIAGNOSIS — K7030 Alcoholic cirrhosis of liver without ascites: Secondary | ICD-10-CM | POA: Diagnosis present

## 2023-11-07 NOTE — Progress Notes (Signed)
 Result letter mailed to pt. Reminder in epic for repeat US. Pt scheduled to see Quentin Mulling PA 11/20/23 at 11:30am.

## 2023-11-20 ENCOUNTER — Ambulatory Visit: Payer: 59 | Admitting: Physician Assistant

## 2023-12-08 ENCOUNTER — Ambulatory Visit: Payer: 59 | Admitting: Physician Assistant

## 2024-01-16 ENCOUNTER — Encounter: Payer: Self-pay | Admitting: Physician Assistant

## 2024-01-16 ENCOUNTER — Other Ambulatory Visit (INDEPENDENT_AMBULATORY_CARE_PROVIDER_SITE_OTHER)

## 2024-01-16 ENCOUNTER — Ambulatory Visit: Payer: 59 | Admitting: Physician Assistant

## 2024-01-16 DIAGNOSIS — F1410 Cocaine abuse, uncomplicated: Secondary | ICD-10-CM

## 2024-01-16 DIAGNOSIS — K7030 Alcoholic cirrhosis of liver without ascites: Secondary | ICD-10-CM | POA: Diagnosis not present

## 2024-01-16 DIAGNOSIS — K766 Portal hypertension: Secondary | ICD-10-CM

## 2024-01-16 DIAGNOSIS — R772 Abnormality of alphafetoprotein: Secondary | ICD-10-CM

## 2024-01-16 DIAGNOSIS — F10220 Alcohol dependence with intoxication, uncomplicated: Secondary | ICD-10-CM

## 2024-01-16 DIAGNOSIS — Z8719 Personal history of other diseases of the digestive system: Secondary | ICD-10-CM

## 2024-01-16 DIAGNOSIS — F1090 Alcohol use, unspecified, uncomplicated: Secondary | ICD-10-CM | POA: Diagnosis not present

## 2024-01-16 LAB — COMPREHENSIVE METABOLIC PANEL
ALT: 14 U/L (ref 0–53)
AST: 24 U/L (ref 0–37)
Albumin: 4.3 g/dL (ref 3.5–5.2)
Alkaline Phosphatase: 75 U/L (ref 39–117)
BUN: 8 mg/dL (ref 6–23)
CO2: 24 meq/L (ref 19–32)
Calcium: 9.9 mg/dL (ref 8.4–10.5)
Chloride: 98 meq/L (ref 96–112)
Creatinine, Ser: 0.66 mg/dL (ref 0.40–1.50)
GFR: 98.93 mL/min (ref >60.00)
Glucose, Bld: 107 mg/dL — ABNORMAL HIGH (ref 70–99)
Potassium: 4.2 meq/L (ref 3.5–5.1)
Sodium: 131 meq/L — ABNORMAL LOW (ref 135–145)
Total Bilirubin: 1.1 mg/dL (ref 0.2–1.2)
Total Protein: 8.5 g/dL — ABNORMAL HIGH (ref 6.0–8.3)

## 2024-01-16 LAB — CBC WITH DIFFERENTIAL/PLATELET
Basophils Absolute: 0 10*3/uL (ref 0.0–0.1)
Basophils Relative: 0.5 % (ref 0.0–3.0)
Eosinophils Absolute: 0.2 10*3/uL (ref 0.0–0.7)
Eosinophils Relative: 2.9 % (ref 0.0–5.0)
HCT: 44.6 % (ref 39.0–52.0)
Hemoglobin: 14.5 g/dL (ref 13.0–17.0)
Lymphocytes Relative: 28.5 % (ref 12.0–46.0)
Lymphs Abs: 1.5 10*3/uL (ref 0.7–4.0)
MCHC: 32.6 g/dL (ref 30.0–36.0)
MCV: 89.5 fl (ref 78.0–100.0)
Monocytes Absolute: 0.7 10*3/uL (ref 0.1–1.0)
Monocytes Relative: 12.4 % — ABNORMAL HIGH (ref 3.0–12.0)
Neutro Abs: 3 10*3/uL (ref 1.4–7.7)
Neutrophils Relative %: 55.7 % (ref 43.0–77.0)
Platelets: 193 10*3/uL (ref 150.0–400.0)
RBC: 4.99 Mil/uL (ref 4.22–5.81)
RDW: 14.7 % (ref 11.5–15.5)
WBC: 5.4 10*3/uL (ref 4.0–10.5)

## 2024-01-16 LAB — PROTIME-INR
INR: 1.1 ratio — ABNORMAL HIGH (ref 0.8–1.0)
Prothrombin Time: 12 s (ref 9.6–13.1)
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
## 2024-01-16 NOTE — Patient Instructions (Addendum)
 _______________________________________________________  If your blood pressure at your visit was 140/90 or greater, please contact your primary care physician to follow up on this.  If you are age 65 or younger, your body mass index should be between 19-25. Your Body mass index is 28.78 kg/m. If this is out of the aformentioned range listed, please consider follow up with your Primary Care Provider.  ________________________________________________________  The [place] [facility] providers would like to encourage you to use [facility] to communicate with providers for non-urgent requests or questions.  Due to long hold times on the telephone, sending your provider a message by [facility] may be a faster and more efficient way to get a response.  Please allow 48 business hours for a response.  Please remember that this is for non-urgent requests.  _______________________________________________________  Bonita Quin have been scheduled for an endoscopy. Please follow written instructions given to you at your visit today.  If you use inhalers (even only as needed), please bring them with you on the day of your procedure.  If you take any of the following medications, they will need to be adjusted prior to your procedure:   DO NOT TAKE 7 DAYS PRIOR TO TEST- Trulicity (dulaglutide) Ozempic, Wegovy (semaglutide) Mounjaro (tirzepatide) Bydureon Bcise (exanatide extended release)  DO NOT TAKE 1 DAY PRIOR TO YOUR TEST Rybelsus (semaglutide) Adlyxin (lixisenatide) Victoza (liraglutide) Byetta (exanatide) ___________________________________________________________________________  Your provider has requested that you go to the basement level for lab work before leaving today. Press "B" on the elevator. The lab is located at the first door on the left as you exit the elevator.  Due to recent changes in healthcare laws, you may see the results of your imaging and laboratory studies on MyChart before your provider has  had a chance to review them.  We understand that in some cases there may be results that are confusing or concerning to you. Not all laboratory results come back in the same time frame and the provider may be waiting for multiple results in order to interpret others.  Please give [place] 48 hours in order for your provider to thoroughly review all the results before contacting the office for clarification of your results.   You will need a follow up appointment in 6 months.  We will contact you to schedule this appointment.   TYLENOL (ACETAMINOPHEN) IS SAFE IN LIVER DISEASE: You can take tylenol (acetaminophen) up to 2,000 mg/day. This would be four extra strength (500mg ) tablets over 24 hours OR six regular strength (325 mg) tablets over 24 hours. Please be sure to read the ingredients of over the counter medications and prescription pain medications as many contain acetaminophen.   NO NSAIDS (ibuprofen, advil, naproxen, aleve, motrin...)   SUPPLEMENTS: multivitamin Zinc Branch Chain Amino acids (BCAA): 12 grams/day L-Ornithine L-Aspartate (LOLA): 6 grams three times/day ([email or web address]) L-Carnitine  PHYSICAL ACTIVITY It is important to continue to be active when you have cirrhosis. Exercise will help reduce muscle loss and weakness.  DISCUSS REFERRAL FOR PHYSICAL THERAPY WITH YOUR PRIMARY CARE PROVIDER  DIET/NUTRITION FOR CIRRHOSIS NO ALCOHOL YOUR GOALS Evening snack - high protein Supplements between meals to help meet calorie and protein goal: Boost Ensure Premier Protein Shakes Protein Greek yogurt Fish, chicken (NO RAW OR UNDERCOOKED FISH/SHELLFISH) Avoid pork and red meat Plant based protein (non-soy)/Vegan: Lentils, Chickpeas, Peanuts (non salted), almonds (non salted), quinoa, chia seeds  Plant based protein supplements (not soy)  Avoid/limit animal based protein supplements: whey, casein 4.  Low sodium (2,000 mg/day) A.  Avoid: table salt, canned foods, deli  meats, sausages, hot dogs, anything with a long shelf life B. Read nutrition labels and be aware of serving size. Don't go by percent of daily value.   Thank you for entrusting me with your care and choosing [facility].  Quentin Mulling, PA-C

## 2024-01-18 LAB — AFP TUMOR MARKER: AFP-Tumor Marker: 8 ng/mL — ABNORMAL HIGH (ref <6.1)

## 2024-01-18 LAB — HEPATITIS B SURFACE ANTIBODY,QUALITATIVE: Hep B S Ab: NONREACTIVE

## 2024-01-18 LAB — HEPATITIS A ANTIBODY, TOTAL: Hepatitis A AB,Total: REACTIVE — AB

## 2024-01-23 ENCOUNTER — Ambulatory Visit (HOSPITAL_COMMUNITY)
Admission: RE | Admit: 2024-01-23 | Discharge: 2024-01-23 | Disposition: A | Discharge status: Home / Self Care | Source: Ambulatory Visit | Attending: Physician Assistant | Admitting: Physician Assistant

## 2024-01-23 DIAGNOSIS — R772 Abnormality of alphafetoprotein: Secondary | ICD-10-CM | POA: Diagnosis present

## 2024-01-23 DIAGNOSIS — K7030 Alcoholic cirrhosis of liver without ascites: Secondary | ICD-10-CM | POA: Diagnosis present

## 2024-01-23 MED ORDER — SODIUM CHLORIDE (PF) 0.9 % IJ SOLN
INTRAMUSCULAR | Status: AC
  Filled 2024-01-23: qty 50

## 2024-01-23 MED ORDER — IOHEXOL 300 MG/ML  SOLN
100.0000 mL | Freq: Once | INTRAMUSCULAR | Status: AC | PRN
  Administered 2024-01-23: 100 mL via INTRAVENOUS

## 2024-01-29 NOTE — Progress Notes (Signed)
 Addendum: Reviewed and agree with assessment and management plan. ETOH cessation is paramount for him Burnell Hurta, Carie Caddy, MD

## 2024-02-27 ENCOUNTER — Encounter: Payer: Self-pay | Admitting: Internal Medicine

## 2024-03-04 ENCOUNTER — Telehealth: Payer: Self-pay | Admitting: Internal Medicine

## 2024-03-04 ENCOUNTER — Other Ambulatory Visit: Payer: Self-pay

## 2024-03-04 ENCOUNTER — Emergency Department (HOSPITAL_COMMUNITY)

## 2024-03-04 ENCOUNTER — Emergency Department (HOSPITAL_COMMUNITY)
Admission: EM | Admit: 2024-03-04 | Discharge: 2024-03-04 | Disposition: A | Discharge status: Home / Self Care | Attending: Emergency Medicine | Admitting: Emergency Medicine

## 2024-03-04 DIAGNOSIS — W010XXA Fall on same level from slipping, tripping and stumbling without subsequent striking against object, initial encounter: Secondary | ICD-10-CM | POA: Insufficient documentation

## 2024-03-04 DIAGNOSIS — S76112A Strain of left quadriceps muscle, fascia and tendon, initial encounter: Secondary | ICD-10-CM | POA: Insufficient documentation

## 2024-03-04 DIAGNOSIS — R1030 Lower abdominal pain, unspecified: Secondary | ICD-10-CM | POA: Diagnosis present

## 2024-03-04 DIAGNOSIS — T148XXA Other injury of unspecified body region, initial encounter: Secondary | ICD-10-CM

## 2024-03-04 MED ORDER — ACETAMINOPHEN 500 MG PO TABS: 500.0000 mg | ORAL_TABLET | Freq: Four times a day (QID) | ORAL | 0 refills | Status: DC | PRN

## 2024-03-04 MED ORDER — IBUPROFEN 600 MG PO TABS: 600.0000 mg | ORAL_TABLET | Freq: Four times a day (QID) | ORAL | 0 refills | Status: DC | PRN

## 2024-03-04 MED ORDER — OXYCODONE-ACETAMINOPHEN 5-325 MG PO TABS
1.0000 | ORAL_TABLET | Freq: Once | ORAL | Status: AC
  Administered 2024-03-04: 1 via ORAL
  Filled 2024-03-04: qty 1

## 2024-03-04 MED ORDER — NAPROXEN 500 MG PO TABS
500.0000 mg | ORAL_TABLET | Freq: Once | ORAL | Status: AC
  Administered 2024-03-04: 500 mg via ORAL
  Filled 2024-03-04: qty 1

## 2024-03-04 MED ORDER — METHOCARBAMOL 500 MG PO TABS: 500.0000 mg | ORAL_TABLET | Freq: Two times a day (BID) | ORAL | 0 refills | Status: DC

## 2024-03-04 MED ORDER — LIDOCAINE 5 % EX OINT: 1.0000 | TOPICAL_OINTMENT | CUTANEOUS | 0 refills | Status: DC | PRN

## 2024-03-04 MED ORDER — DIAZEPAM 2 MG PO TABS
2.0000 mg | ORAL_TABLET | Freq: Once | ORAL | Status: AC
  Administered 2024-03-04: 2 mg via ORAL
  Filled 2024-03-04: qty 1

## 2024-03-04 NOTE — Telephone Encounter (Signed)
 Patient scheduled for EGD tomorrow. Patient is currently admitted into the ed. Requesting t speak with a nurse. Please advise.    Thank you

## 2024-03-04 NOTE — ED Provider Notes (Signed)
 [place] EMERGENCY DEPARTMENT AT [facility] Provider Note   CSN: [id number] Arrival date & time: 03/04/24  4098     History  Chief Complaint  Patient presents with   Fall    Pt arrives by ems for fall yesterday from bathtub, c/o r groin pain and L shoulder pain. No heasdstrike, no loc, no thinners, 144/68, 84HR, 16RR, 97%o2    Chad Turner. is a 63 y.o. male.  HPI    63 year old male comes in with chief complaint of mechanical fall.  Patient had a fall on Saturday.  He states that he was stepping out of the shower, slipped and fell.  In the process he did a split, now is having significant pain over his groin region.  Patient states that he was able to manage ambulating in his house, but with significant pain only.  His pain has persisted today, therefore he called EMS.  Patient has previous history of polysubstance use disease.  He does not take any blood thinners.  He denies any trauma elsewhere and denies any chest pain, shortness of breath, headache, neck pain.  Home Medications Prior to Admission medications   Medication Sig Start Date End Date Taking? Authorizing Provider  acetaminophen  (TYLENOL ) 500 MG tablet Take 1 tablet (500 mg total) by mouth every 6 (six) hours as needed. 03/04/24  Yes Deatra Face, MD  ibuprofen  (ADVIL ) 600 MG tablet Take 1 tablet (600 mg total) by mouth every 6 (six) hours as needed. 03/04/24  Yes Deatra Face, MD  lidocaine  (XYLOCAINE ) 5 % ointment Apply 1 Application topically as needed. 03/04/24  Yes Deatra Face, MD  methocarbamol  (ROBAXIN ) 500 MG tablet Take 1 tablet (500 mg total) by mouth 2 (two) times daily. 03/04/24  Yes Deatra Face, MD  gabapentin  (NEURONTIN ) 600 MG tablet Take 1 tablet (600 mg total) by mouth 3 (three) times daily. 07/14/23 01/16/24  Joice Nares, MD  losartan  (COZAAR ) 100 MG tablet Take 1 tablet (100 mg total) by mouth daily. 07/14/23   Joice Nares, MD  pravastatin  (PRAVACHOL ) 20 MG tablet  Take 1 tablet (20 mg total) by mouth daily. 07/14/23   Joice Nares, MD  traZODone  (DESYREL ) 50 MG tablet Take 50 mg by mouth at bedtime as needed. 12/13/23   [provider]  fluticasone  (FLONASE ) 50 MCG/ACT nasal spray Place 2 sprays into the nose daily. 10/12/11 01/09/12  Ethlyn Herd, MD      Allergies    Penicillins    Review of Systems   Review of Systems  All other systems reviewed and are negative.   Physical Exam Updated Vital Signs BP 136/81   Pulse 84   Temp 98.2 F (36.8 C) (Oral)   Resp 16   SpO2 96%  Physical Exam Vitals and nursing note reviewed.  Constitutional:      Appearance: He is well-developed.  HENT:     Head: Atraumatic.  Cardiovascular:     Rate and Rhythm: Normal rate.  Pulmonary:     Effort: Pulmonary effort is normal.  Musculoskeletal:     Cervical back: Neck supple.     Comments: On exam, patient has no significant tenderness over the right hip.  Pelvis is stable.  Patient has significant discomfort with external and internal rotation of the hip and also palpation of the posterior medial thigh region.  Otherwise:  Head to toe evaluation shows no hematoma, bleeding of the scalp, no facial abrasions, no spine step offs, crepitus of the chest  or neck, no tenderness to palpation of the bilateral upper and lower extremities, no gross deformities, no chest tenderness, no pelvic pain.   Skin:    General: Skin is warm.  Neurological:     Mental Status: He is alert and oriented to person, place, and time.     ED Results / Procedures / Treatments   Labs (all labs ordered are listed, but only abnormal results are displayed) Labs Reviewed - No data to display  EKG None  Radiology DG Hip Unilat W or Wo Pelvis 2-3 Views Right Result Date: 03/04/2024 CLINICAL DATA:  Fall and right hip pain. EXAM: DG HIP (WITH OR WITHOUT PELVIS) 2-3V RIGHT COMPARISON:  None Available. FINDINGS: There is a total left hip arthroplasty. The arthroplasty  components appear intact and in anatomic alignment. There is no acute fracture or dislocation. The bones are osteopenic. There is moderate arthritic changes of the right hip. Prostate brachytherapy seeds noted. The soft tissues are unremarkable IMPRESSION: 1. No acute fracture or dislocation. 2. Moderate arthritic changes of the right hip. Electronically Signed   By: Angus Bark M.D.   On: 03/04/2024 11:22    Procedures Procedures    Medications Ordered in ED Medications  oxyCODONE -acetaminophen  (PERCOCET/ROXICET) 5-325 MG per tablet 1 tablet (1 tablet Oral Given 03/04/24 0955)  diazepam (VALIUM) tablet 2 mg (2 mg Oral Given 03/04/24 0955)  naproxen  (NAPROSYN ) tablet 500 mg (500 mg Oral Given 03/04/24 4098)    ED Course/ Medical Decision Making/ A&P                                 Medical Decision Making Amount and/or Complexity of Data Reviewed Radiology: ordered.  Risk OTC drugs. Prescription drug management.   63 year old patient comes in after sustaining what appears to be a mechanical fall. Pertinent past medical includes polysubstance use disease.  Patient does not take any blood thinners.  Based on my history and exam, differential diagnosis includes: - Long bone fractures - Contusions - Soft tissue injury  Based on the initial assessment, the following workup was initiated -x-ray of the right hip.  I do not think x-ray of the femur is needed.  I have independently interpreted the following imaging from the perspective of acute trauma: X-ray of the hip and the results indicate no evidence of displaced fracture.  Will recommend RICE.  Knee immobilizer provided along with crutches.  Weightbearing as tolerated allowed.  If patient's symptoms do not improve, he will call orthopedic surgery.  11:34 AM The patient appears reasonably screened and/or stabilized for discharge and I doubt any other medical condition or other [facility] requiring further screening, evaluation, or  treatment in the ED at this time prior to discharge.   Results from the ER workup discussed with the patient face to face and all questions answered to the best of my ability. The patient is safe for discharge with strict return precautions.   Final Clinical Impression(s) / ED Diagnoses Final diagnoses:  Pulled muscle  Quadriceps strain, left, initial encounter    Rx / DC Orders ED Discharge Orders          Ordered    acetaminophen  (TYLENOL ) 500 MG tablet  Every 6 hours PRN        03/04/24 1132    ibuprofen  (ADVIL ) 600 MG tablet  Every 6 hours PRN        03/04/24 1132    methocarbamol  (  ROBAXIN ) 500 MG tablet  2 times daily        03/04/24 1132    lidocaine  (XYLOCAINE ) 5 % ointment  As needed        03/04/24 1132              Deatra Face, MD 03/04/24 1134

## 2024-03-04 NOTE — Telephone Encounter (Signed)
 Returned pt's call. He stated again that he was currently in the hospital. No work up complete as of yet, per notes. Pt states he wants to reschedule. Instructed pt to call back to reschedule. Once he is discharged from the hospital. Pt agreeable.

## 2024-03-04 NOTE — ED Notes (Signed)
 Contacted ortho tech to teach pt crutches and put on knee immobilizer

## 2024-03-04 NOTE — Discharge Instructions (Addendum)
 The x-ray shows no fracture. Take the medications as prescribed. Call your primary care doctor for a follow-up in 7 to 10 days.  If you are unable to see them, then orthopedic doctor information, Dr. Curtiss Dowdy has also been provided.

## 2024-03-04 NOTE — Progress Notes (Signed)
 Orthopedic Tech Progress Note Patient Details:  Chad Turner 05-31-1961 [id number]  Ortho Devices Type of Ortho Device: Crutches, Knee Immobilizer Ortho Device/Splint Location: RLE Ortho Device/Splint Interventions: Ordered, Application, Adjustment   Post Interventions Patient Tolerated: Well Instructions Provided: Care of device, Adjustment of device, Poper ambulation with device  [name] 03/04/2024, 11:39 AM

## 2024-03-04 NOTE — ED Notes (Signed)
Denies chest pain/sob at this time -

## 2024-03-04 NOTE — ED Notes (Signed)
 Pt read dc instructions, no questions at this time. Pt in nad, given sandwich is calling ride to come get him

## 2024-03-05 ENCOUNTER — Encounter: Admitting: Internal Medicine

## 2024-03-21 ENCOUNTER — Other Ambulatory Visit: Payer: Self-pay | Admitting: Student

## 2024-03-21 DIAGNOSIS — K7030 Alcoholic cirrhosis of liver without ascites: Secondary | ICD-10-CM

## 2024-03-27 ENCOUNTER — Ambulatory Visit (AMBULATORY_SURGERY_CENTER)

## 2024-03-27 VITALS — Ht 70.0 in | Wt 210.0 lb

## 2024-03-27 DIAGNOSIS — K766 Portal hypertension: Secondary | ICD-10-CM

## 2024-03-27 DIAGNOSIS — F1410 Cocaine abuse, uncomplicated: Secondary | ICD-10-CM

## 2024-03-27 DIAGNOSIS — F10220 Alcohol dependence with intoxication, uncomplicated: Secondary | ICD-10-CM

## 2024-03-27 DIAGNOSIS — K7030 Alcoholic cirrhosis of liver without ascites: Secondary | ICD-10-CM

## 2024-03-27 DIAGNOSIS — Z8719 Personal history of other diseases of the digestive system: Secondary | ICD-10-CM

## 2024-03-27 NOTE — Progress Notes (Signed)
 No egg or soy allergy known to patient  No issues known to pt with past sedation with any surgeries or procedures Patient denies ever being told they had issues or difficulty with intubation  No FH of Malignant Hyperthermia Pt is not on diet pills Pt is not on  home 02  Pt is not on blood thinners  Pt denies issues with constipation  No A fib or A flutter Have any cardiac testing pending--no  LOA: independent   Patient's chart reviewed by Rogena Class CNRA prior to previsit and patient appropriate for the [facility].  Previsit completed and red dot placed by patient's name on their procedure day (on provider's schedule).     PV completed with patient. Prep instructions sent to  home address.

## 2024-04-07 ENCOUNTER — Encounter: Payer: Self-pay | Admitting: Internal Medicine

## 2024-04-15 ENCOUNTER — Ambulatory Visit: Admitting: Internal Medicine

## 2024-04-15 ENCOUNTER — Encounter: Payer: Self-pay | Admitting: Internal Medicine

## 2024-04-15 VITALS — BP 120/72 | HR 66 | Temp 97.8°F | Resp 17 | Ht 70.0 in | Wt 210.0 lb

## 2024-04-15 DIAGNOSIS — K7030 Alcoholic cirrhosis of liver without ascites: Secondary | ICD-10-CM

## 2024-04-15 DIAGNOSIS — K319 Disease of stomach and duodenum, unspecified: Secondary | ICD-10-CM

## 2024-04-15 DIAGNOSIS — K449 Diaphragmatic hernia without obstruction or gangrene: Secondary | ICD-10-CM | POA: Diagnosis not present

## 2024-04-15 DIAGNOSIS — K31A19 Gastric intestinal metaplasia without dysplasia, unspecified site: Secondary | ICD-10-CM | POA: Diagnosis not present

## 2024-04-15 DIAGNOSIS — Z8719 Personal history of other diseases of the digestive system: Secondary | ICD-10-CM

## 2024-04-15 DIAGNOSIS — K2950 Unspecified chronic gastritis without bleeding: Secondary | ICD-10-CM

## 2024-04-15 DIAGNOSIS — K766 Portal hypertension: Secondary | ICD-10-CM

## 2024-04-15 MED ORDER — SODIUM CHLORIDE 0.9 % IV SOLN: 500.0000 mL | Freq: Once | INTRAVENOUS | Status: DC

## 2024-04-15 NOTE — Progress Notes (Signed)
 1622 Robinul 0.1 mg IV given due large amount of secretions upon assessment.  MD made aware, vss

## 2024-04-15 NOTE — Progress Notes (Signed)
 Called to room to assist during endoscopic procedure.  Patient ID and intended procedure confirmed with present staff. Received instructions for my participation in the procedure from the performing physician.

## 2024-04-15 NOTE — Op Note (Signed)
 [facility] Patient Name: Chad Turner Procedure Date: 04/15/2024 4:09 PM MRN: [id number] Endoscopist: Nannette Babe , MD, [id number] Age: 63 Referring MD:  Date of Birth: 16-Sep-1961 Gender: Male Account #: [id number] Procedure:                Upper GI endoscopy Indications:              Follow-up of gastritis with intestinal metaplasia                            and mild atypia; history of H. Pylori previously                            treated and negative at last exam, Cirrhosis rule                            out esophageal varices (no varices on last EGD                            March 2022; small varices March 2021) Medicines:                Monitored Anesthesia Care Procedure:                Pre-Anesthesia Assessment:                           - Prior to the procedure, a History and Physical                            was performed, and patient medications and                            allergies were reviewed. The patient's tolerance of                            previous anesthesia was also reviewed. The risks                            and benefits of the procedure and the sedation                            options and risks were discussed with the patient.                            All questions were answered, and informed consent                            was obtained. Prior Anticoagulants: The patient has                            taken no anticoagulant or antiplatelet agents. ASA                            Grade Assessment: III - A patient with severe  systemic disease. After reviewing the risks and                            benefits, the patient was deemed in satisfactory                            condition to undergo the procedure.                           After obtaining informed consent, the endoscope was                            passed under direct vision. Throughout the                            procedure, the  patient's blood pressure, pulse, and                            oxygen saturations were monitored continuously. The                            GIF [id number] #[id number] was introduced through the                            mouth, and advanced to the second part of duodenum.                            The upper GI endoscopy was accomplished without                            difficulty. The patient tolerated the procedure                            well. Scope In: Scope Out: Findings:                 The examined esophagus was normal. No evidence for                            esophageal varices.                           A 2 cm hiatal hernia was present.                           Patchy moderate inflammation characterized by                            congestion (edema), erythema, granularity and                            nodularity was found in the entire examined                            stomach. Multiple biopsies were obtained with cold  forceps for histology and Helicobacter pylori                            testing including IHC testing in a targeted manner                            in the cardia (nodularity on the lesser curve of                            the gastric cardia), in the gastric fundus, in the                            gastric body, at the incisura and in the gastric                            antrum.                           The examined duodenum was normal. Complications:            No immediate complications. Estimated Blood Loss:     Estimated blood loss was minimal. Impression:               - Normal esophagus.                           - No evidence for esophageal varices.                           - 2 cm hiatal hernia.                           - Chronic atrophic gastritis. Biopsied as below.                           - Normal examined duodenum.                           - Multiple biopsies were obtained in the cardia, in                             the gastric fundus, in the gastric body, at the                            incisura and in the gastric antrum. Recommendation:           - Patient has a contact number available for                            emergencies. The signs and symptoms of potential                            delayed complications were discussed with the                            patient. Return to  normal activities tomorrow.                            Written discharge instructions were provided to the                            patient.                           - Resume previous diet.                           - Continue present medications.                           - Await pathology results.                           - See the other procedure note for documentation of                            additional recommendations.                           - Repeat upper endoscopy for surveillance based on                            pathology results. Nannette Babe, MD 04/15/2024 4:54:38 PM This report has been signed electronically.

## 2024-04-15 NOTE — Progress Notes (Signed)
VS completed by DT.  Pt's states no medical or surgical changes since previsit or office visit.  
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
## 2024-04-15 NOTE — Patient Instructions (Signed)
 Resume previous diet Continue present medications Await pathology results  Handouts/information given for Gastritis and Hiatal Hernia  YOU HAD AN ENDOSCOPIC PROCEDURE TODAY AT THE [facility]:   Refer to the procedure report that was given to you for any specific questions about what was found during the examination.  If the procedure report does not answer your questions, please call your gastroenterologist to clarify.  If you requested that your care partner not be given the details of your procedure findings, then the procedure report has been included in a sealed envelope for you to review at your convenience later.  YOU SHOULD EXPECT: Some feelings of bloating in the abdomen. Passage of more gas than usual.  Walking can help get rid of the air that was put into your GI tract during the procedure and reduce the bloating. If you had a lower endoscopy (such as a colonoscopy or flexible sigmoidoscopy) you may notice spotting of blood in your stool or on the toilet paper. If you underwent a bowel prep for your procedure, you may not have a normal bowel movement for a few days.  Please Note:  You might notice some irritation and congestion in your nose or some drainage.  This is from the oxygen used during your procedure.  There is no need for concern and it should clear up in a day or so.  SYMPTOMS TO REPORT IMMEDIATELY:  Following upper endoscopy (EGD)  Vomiting of blood or coffee ground material  New chest pain or pain under the shoulder blades  Painful or persistently difficult swallowing  New shortness of breath  Fever of 100F or higher  Black, tarry-looking stools  For urgent or emergent issues, a gastroenterologist can be reached at any hour by calling [phone]. Do not use MyChart messaging for urgent concerns.   DIET:  We do recommend a small meal at first, but then you may proceed to your regular diet.  Drink plenty of fluids but you should avoid alcoholic  beverages for 24 hours.  ACTIVITY:  You should plan to take it easy for the rest of today and you should NOT DRIVE or use heavy machinery until tomorrow (because of the sedation medicines used during the test).    FOLLOW UP: Our staff will call the number listed on your records the next business day following your procedure.  We will call around 7:15- 8:00 am to check on you and address any questions or concerns that you may have regarding the information given to you following your procedure. If we do not reach you, we will leave a message.     If any biopsies were taken you will be contacted by phone or by letter within the next 1-3 weeks.  Please call us  at [phone] if you have not heard about the biopsies in 3 weeks.   SIGNATURES/CONFIDENTIALITY: You and/or your care partner have signed paperwork which will be entered into your electronic medical record.  These signatures attest to the fact that that the information above on your After Visit Summary has been reviewed and is understood.  Full responsibility of the confidentiality of this discharge information lies with you and/or your care-partner.

## 2024-04-15 NOTE — Progress Notes (Signed)
 Report given to PACU, vss

## 2024-04-16 ENCOUNTER — Telehealth: Payer: Self-pay | Admitting: *Deleted

## 2024-04-16 NOTE — Telephone Encounter (Signed)
 Left messae on f/u call

## 2024-04-18 LAB — SURGICAL PATHOLOGY

## 2024-04-19 ENCOUNTER — Ambulatory Visit: Payer: Self-pay | Admitting: Internal Medicine

## 2024-05-10 ENCOUNTER — Ambulatory Visit (HOSPITAL_COMMUNITY)
Admission: EM | Admit: 2024-05-10 | Discharge: 2024-05-10 | Disposition: A | Discharge status: Home / Self Care | Attending: Emergency Medicine | Admitting: Emergency Medicine

## 2024-05-10 ENCOUNTER — Encounter (HOSPITAL_COMMUNITY): Payer: Self-pay

## 2024-05-10 DIAGNOSIS — M542 Cervicalgia: Secondary | ICD-10-CM

## 2024-05-10 MED ORDER — CYCLOBENZAPRINE HCL 7.5 MG PO TABS: 7.5000 mg | ORAL_TABLET | Freq: Every evening | ORAL | 0 refills | Status: AC

## 2024-05-10 MED ORDER — IBUPROFEN 800 MG PO TABS
ORAL_TABLET | ORAL | Status: AC
  Filled 2024-05-10: qty 1

## 2024-05-10 MED ORDER — IBUPROFEN 800 MG PO TABS
800.0000 mg | ORAL_TABLET | Freq: Once | ORAL | Status: AC
  Administered 2024-05-10: 800 mg via ORAL

## 2024-05-10 MED ORDER — IBUPROFEN 800 MG PO TABS: 800.0000 mg | ORAL_TABLET | Freq: Three times a day (TID) | ORAL | 0 refills | Status: AC
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
## 2024-05-10 NOTE — Discharge Instructions (Addendum)
 Ibuprofen  can be taken every 6 hours as needed for pain and inflammation.  You can take the muscle relaxer Flexeril  twice daily. If the medication makes you drowsy, take only at bed time. I also recommend using heating pad on the neck for 20-30 minutes at a time to relax the muscles You may be sore for the next several days. Please return if needed

## 2024-07-11 ENCOUNTER — Ambulatory Visit: Admitting: Nurse Practitioner

## 2024-07-31 ENCOUNTER — Telehealth: Payer: Self-pay

## 2024-07-31 DIAGNOSIS — R772 Abnormality of alphafetoprotein: Secondary | ICD-10-CM

## 2024-07-31 DIAGNOSIS — K7030 Alcoholic cirrhosis of liver without ascites: Secondary | ICD-10-CM

## 2024-07-31 NOTE — Telephone Encounter (Addendum)
 Called and spoke with patient to remind him that he is due for repeat imaging at this time for cirrhosis and HCC screening. Patient has been advised that he will be having an MRI this time. Patient knows to expect a call from radiology scheduling to set up his appt. Patient verbalized understanding and had no concerns at the end of the call.   MRI order in epic. Secure staff message sent to radiology scheduling to contact patient to schedule MRI appt.

## 2024-07-31 NOTE — Telephone Encounter (Signed)
-----   Message from Nurse [place] B sent at 01/24/2024  8:34 AM EDT ----- Regarding: MRI abdominal MRI - cirrhosis, elevated AFP

## 2024-08-21 ENCOUNTER — Ambulatory Visit (HOSPITAL_COMMUNITY)
Admission: RE | Admit: 2024-08-21 | Discharge: 2024-08-21 | Disposition: A | Discharge status: Home / Self Care | Source: Ambulatory Visit | Attending: Physician Assistant | Admitting: Physician Assistant

## 2024-08-21 DIAGNOSIS — K7030 Alcoholic cirrhosis of liver without ascites: Secondary | ICD-10-CM | POA: Insufficient documentation

## 2024-08-21 DIAGNOSIS — R772 Abnormality of alphafetoprotein: Secondary | ICD-10-CM | POA: Insufficient documentation

## 2024-08-21 MED ORDER — GADOBUTROL 1 MMOL/ML IV SOLN
9.0000 mL | Freq: Once | INTRAVENOUS | Status: AC | PRN
  Administered 2024-08-21: 9 mL via INTRAVENOUS

## 2024-08-23 ENCOUNTER — Ambulatory Visit: Payer: Self-pay | Admitting: Physician Assistant

## 2024-09-13 ENCOUNTER — Ambulatory Visit: Admitting: Nurse Practitioner

## 2024-09-13 NOTE — Progress Notes (Deleted)
 09/13/2024 Lukas Pelcher [id number] Jul 20, 1961   Chief Complaint:  History of Present Illness: Deveon Kisiel is a 63 year old male with a past medical history of diabetes mellitus type 2, prostate cancer, H. pylori 2021, IDA, alcohol  associated cirrhosis with esophageal varices and portal hypertensive gastropathy.   Discussed the use of AI scribe software for clinical note transcription with the patient, who gave verbal consent to proceed.  History of Present Illness      Latest Ref Rng & Units 01/16/2024   11:36 AM 07/11/2023   10:39 PM 02/16/2022   11:05 AM  CBC  WBC 4.0 - 10.5 K/uL 5.4  6.9  6.1   Hemoglobin 13.0 - 17.0 g/dL 85.4  86.2  86.7   Hematocrit 39.0 - 52.0 % 44.6  40.4  41.1   Platelets 150.0 - 400.0 K/uL 193.0  217  308        Latest Ref Rng & Units 01/16/2024   11:36 AM 07/11/2023   10:39 PM 02/16/2022   11:05 AM  CMP  Glucose 70 - 99 mg/dL 892  86  848   BUN 6 - 23 mg/dL 8  6  4    Creatinine 0.40 - 1.50 mg/dL 9.33  9.29  9.25   Sodium 135 - 145 mEq/L 131  134  133   Potassium 3.5 - 5.1 mEq/L 4.2  4.3  3.6   Chloride 96 - 112 mEq/L 98  101  96   CO2 19 - 32 mEq/L 24  24  24    Calcium 8.4 - 10.5 mg/dL 9.9  9.0  9.2   Total Protein 6.0 - 8.3 g/dL 8.5  7.9    Total Bilirubin 0.2 - 1.2 mg/dL 1.1  0.9    Alkaline Phos 39 - 117 U/L 75  111    AST 0 - 37 U/L 24  76    ALT 0 - 53 U/L 14  27      MELD 3.0: 12 at 01/16/2024 11:36 AM MELD-Na: 8 at 01/16/2024 11:36 AM Calculated from: Serum Creatinine: 0.66 mg/dL (Using min of 1 mg/dL) at 6/81/7974 88:63 AM Serum Sodium: 131 mEq/L at 01/16/2024 11:36 AM Total Bilirubin: 1.1 mg/dL at 6/81/7974 88:63 AM Serum Albumin: 4.3 g/dL (Using max of 3.5 g/dL) at 6/81/7974 88:63 AM INR(ratio): 1.1 ratio at 01/16/2024 11:36 AM Age at listing (hypothetical): 85 years Sex: Male at 01/16/2024 11:36 AM  PAST GI PROCEDURES:   Current Medications, Allergies, Past Medical History, Past Surgical History, Family  History and Social History were reviewed in [employer] record.   Review of Systems:   Constitutional: Negative for fever, sweats, chills or weight loss.  Respiratory: Negative for shortness of breath.   Cardiovascular: Negative for chest pain, palpitations and leg swelling.  Gastrointestinal: See HPI.  Musculoskeletal: Negative for back pain or muscle aches.  Neurological: Negative for dizziness, headaches or paresthesias.    Physical Exam: There were no vitals taken for this visit. General: in no acute distress. Head: Normocephalic and atraumatic. Eyes: No scleral icterus. Conjunctiva pink . Ears: Normal auditory acuity. Mouth: Dentition intact. No ulcers or lesions.  Lungs: Clear throughout to auscultation. Heart: Regular rate and rhythm, no murmur. Abdomen: Soft, nontender and nondistended. No masses or hepatomegaly. Normal bowel sounds x 4 quadrants.  Rectal: *** Musculoskeletal: Symmetrical with no gross deformities. Extremities: No edema. Neurological: Alert oriented x 4. No focal deficits.  Psychological: Alert and cooperative. Normal mood and affect  Assessment and Recommendations: ***

## 2024-10-03 ENCOUNTER — Encounter (HOSPITAL_COMMUNITY): Payer: Self-pay | Admitting: Emergency Medicine

## 2024-10-03 ENCOUNTER — Ambulatory Visit (HOSPITAL_COMMUNITY)
Admission: EM | Admit: 2024-10-03 | Discharge: 2024-10-03 | Disposition: A | Discharge status: Home / Self Care | Attending: Emergency Medicine | Admitting: Emergency Medicine

## 2024-10-03 DIAGNOSIS — L03811 Cellulitis of head [any part, except face]: Secondary | ICD-10-CM | POA: Diagnosis not present

## 2024-10-03 DIAGNOSIS — L03213 Periorbital cellulitis: Secondary | ICD-10-CM

## 2024-10-03 MED ORDER — SULFAMETHOXAZOLE-TRIMETHOPRIM 800-160 MG PO TABS: 2.0000 | ORAL_TABLET | Freq: Two times a day (BID) | ORAL | 0 refills | Status: AC
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
## 2024-10-03 NOTE — ED Triage Notes (Signed)
 Pt c/o sty to left eye for several days. Reports irritating. Denies any visual impairment. Reports has glasses but lost them when moved.

## 2024-10-03 NOTE — Discharge Instructions (Signed)
 For treatment of the infection of the soft tissue around your eye and the soft tissue on the top of your head, please begin taking Bactrim .  Please take 2 tablets twice daily for the next 2 weeks.  You are welcome to continue applying a warm compress to your left eye as well as to the top of your head to help promote circulation and encourage drainage.  If you have not seeing meaningful improvement of the redness and swelling after taking Bactrim  for the next 2 to 3 days, recommend that she go to the emergency room for further evaluation.  Thank you for visiting [facility] today.
